# Patient Record
Sex: Female | Born: 1960 | Race: White | Hispanic: No | Smoking: Former smoker
Health system: Southern US, Community
[De-identification: ages and names within clinical notes are randomized; demographics above are authoritative.]

## PROBLEM LIST (undated history)

## (undated) DIAGNOSIS — F32A Depression, unspecified: Secondary | ICD-10-CM

## (undated) DIAGNOSIS — K219 Gastro-esophageal reflux disease without esophagitis: Secondary | ICD-10-CM

## (undated) DIAGNOSIS — I7090 Unspecified atherosclerosis: Secondary | ICD-10-CM

## (undated) DIAGNOSIS — E7800 Pure hypercholesterolemia, unspecified: Secondary | ICD-10-CM

## (undated) DIAGNOSIS — E785 Hyperlipidemia, unspecified: Secondary | ICD-10-CM

## (undated) DIAGNOSIS — G629 Polyneuropathy, unspecified: Secondary | ICD-10-CM

## (undated) DIAGNOSIS — R7303 Prediabetes: Secondary | ICD-10-CM

## (undated) DIAGNOSIS — B009 Herpesviral infection, unspecified: Secondary | ICD-10-CM

## (undated) DIAGNOSIS — Z9889 Other specified postprocedural states: Secondary | ICD-10-CM

## (undated) DIAGNOSIS — D692 Other nonthrombocytopenic purpura: Secondary | ICD-10-CM

## (undated) DIAGNOSIS — R112 Nausea with vomiting, unspecified: Secondary | ICD-10-CM

## (undated) DIAGNOSIS — M797 Fibromyalgia: Secondary | ICD-10-CM

## (undated) DIAGNOSIS — F329 Major depressive disorder, single episode, unspecified: Secondary | ICD-10-CM

## (undated) DIAGNOSIS — F419 Anxiety disorder, unspecified: Secondary | ICD-10-CM

## (undated) DIAGNOSIS — J449 Chronic obstructive pulmonary disease, unspecified: Secondary | ICD-10-CM

## (undated) DIAGNOSIS — H4720 Unspecified optic atrophy: Secondary | ICD-10-CM

## (undated) DIAGNOSIS — I10 Essential (primary) hypertension: Secondary | ICD-10-CM

## (undated) HISTORY — DX: Polyneuropathy, unspecified: G62.9

## (undated) HISTORY — PX: CARPAL TUNNEL RELEASE: SHX101

## (undated) HISTORY — DX: Unspecified atherosclerosis: I70.90

## (undated) HISTORY — DX: Chronic obstructive pulmonary disease, unspecified: J44.9

## (undated) HISTORY — DX: Prediabetes: R73.03

## (undated) HISTORY — PX: THUMB ARTHROSCOPY: SHX2509

## (undated) HISTORY — DX: Pure hypercholesterolemia, unspecified: E78.00

## (undated) HISTORY — PX: TUBAL LIGATION: SHX77

## (undated) HISTORY — DX: Other nonthrombocytopenic purpura: D69.2

## (undated) HISTORY — PX: TONSILLECTOMY: SUR1361

## (undated) HISTORY — PX: RIGHT OOPHORECTOMY: SHX2359

## (undated) HISTORY — DX: Anxiety disorder, unspecified: F41.9

## (undated) HISTORY — DX: Unspecified optic atrophy: H47.20

---

## 1992-11-23 HISTORY — PX: ABDOMINAL HYSTERECTOMY: SHX81

## 1998-05-15 ENCOUNTER — Other Ambulatory Visit: Admission: RE | Admit: 1998-05-15 | Discharge: 1998-05-15 | Payer: Self-pay | Admitting: *Deleted

## 2003-10-28 ENCOUNTER — Emergency Department (HOSPITAL_COMMUNITY): Admission: EM | Admit: 2003-10-28 | Discharge: 2003-10-28 | Payer: Self-pay | Admitting: Emergency Medicine

## 2005-12-28 ENCOUNTER — Ambulatory Visit (HOSPITAL_COMMUNITY): Admission: RE | Admit: 2005-12-28 | Discharge: 2005-12-28 | Payer: Self-pay | Admitting: Internal Medicine

## 2007-06-11 ENCOUNTER — Emergency Department (HOSPITAL_COMMUNITY): Admission: EM | Admit: 2007-06-11 | Discharge: 2007-06-11 | Payer: Self-pay | Admitting: Emergency Medicine

## 2007-09-02 ENCOUNTER — Ambulatory Visit (HOSPITAL_COMMUNITY): Admission: RE | Admit: 2007-09-02 | Discharge: 2007-09-02 | Payer: Self-pay | Admitting: Family Medicine

## 2008-01-28 ENCOUNTER — Emergency Department (HOSPITAL_COMMUNITY): Admission: EM | Admit: 2008-01-28 | Discharge: 2008-01-28 | Payer: Self-pay | Admitting: Emergency Medicine

## 2008-05-15 ENCOUNTER — Ambulatory Visit (HOSPITAL_COMMUNITY): Admission: RE | Admit: 2008-05-15 | Discharge: 2008-05-15 | Payer: Self-pay | Admitting: Obstetrics and Gynecology

## 2008-11-26 ENCOUNTER — Emergency Department (HOSPITAL_COMMUNITY): Admission: EM | Admit: 2008-11-26 | Discharge: 2008-11-26 | Payer: Self-pay | Admitting: Emergency Medicine

## 2009-04-14 ENCOUNTER — Emergency Department (HOSPITAL_COMMUNITY): Admission: EM | Admit: 2009-04-14 | Discharge: 2009-04-14 | Payer: Self-pay | Admitting: Emergency Medicine

## 2009-06-25 ENCOUNTER — Emergency Department (HOSPITAL_COMMUNITY): Admission: EM | Admit: 2009-06-25 | Discharge: 2009-06-25 | Payer: Self-pay | Admitting: Emergency Medicine

## 2009-09-01 ENCOUNTER — Emergency Department (HOSPITAL_COMMUNITY): Admission: EM | Admit: 2009-09-01 | Discharge: 2009-09-01 | Payer: Self-pay | Admitting: Emergency Medicine

## 2010-01-14 ENCOUNTER — Encounter: Admission: RE | Admit: 2010-01-14 | Discharge: 2010-04-14 | Payer: Self-pay | Admitting: Anesthesiology

## 2010-07-29 ENCOUNTER — Emergency Department (HOSPITAL_COMMUNITY): Admission: EM | Admit: 2010-07-29 | Discharge: 2010-07-29 | Payer: Self-pay | Admitting: Emergency Medicine

## 2010-12-14 ENCOUNTER — Encounter: Payer: Self-pay | Admitting: Family Medicine

## 2010-12-15 ENCOUNTER — Encounter: Payer: Self-pay | Admitting: Family Medicine

## 2011-02-26 LAB — BASIC METABOLIC PANEL
BUN: 7 mg/dL (ref 6–23)
CO2: 28 mEq/L (ref 19–32)
Calcium: 9.1 mg/dL (ref 8.4–10.5)
Chloride: 105 mEq/L (ref 96–112)
Creatinine, Ser: 0.65 mg/dL (ref 0.4–1.2)
GFR calc Af Amer: >60 mL/min (ref >60)
GFR calc non Af Amer: >60 mL/min (ref >60)
Glucose, Bld: 94 mg/dL (ref 70–99)
Potassium: 3.5 mEq/L (ref 3.5–5.1)
Sodium: 138 mEq/L (ref 135–145)

## 2011-02-26 LAB — DIFFERENTIAL
Basophils Absolute: 0 10*3/uL (ref 0.0–0.1)
Basophils Relative: 0 % (ref 0–1)
Eosinophils Absolute: 0.3 10*3/uL (ref 0.0–0.7)
Eosinophils Relative: 3 % (ref 0–5)
Lymphocytes Relative: 24 % (ref 12–46)
Lymphs Abs: 2.6 10*3/uL (ref 0.7–4.0)
Monocytes Absolute: 0.7 10*3/uL (ref 0.1–1.0)
Monocytes Relative: 6 % (ref 3–12)
Neutro Abs: 7.4 10*3/uL (ref 1.7–7.7)
Neutrophils Relative %: 67 % (ref 43–77)

## 2011-02-26 LAB — RAPID URINE DRUG SCREEN, HOSP PERFORMED
Amphetamines: NOT DETECTED
Barbiturates: NOT DETECTED
Benzodiazepines: NOT DETECTED
Cocaine: NOT DETECTED
Opiates: NOT DETECTED
Tetrahydrocannabinol: NOT DETECTED

## 2011-02-26 LAB — CBC
HCT: 41 % (ref 36.0–46.0)
Hemoglobin: 14 g/dL (ref 12.0–15.0)
MCHC: 34.3 g/dL (ref 30.0–36.0)
MCV: 85.7 fL (ref 78.0–100.0)
Platelets: 295 10*3/uL (ref 150–400)
RBC: 4.78 MIL/uL (ref 3.87–5.11)
RDW: 13.6 % (ref 11.5–15.5)
WBC: 11.1 10*3/uL — ABNORMAL HIGH (ref 4.0–10.5)

## 2011-02-26 LAB — ETHANOL: Alcohol, Ethyl (B): <5 mg/dL (ref 0–10)

## 2011-03-03 LAB — COMPREHENSIVE METABOLIC PANEL
ALT: 34 U/L (ref 0–35)
AST: 29 U/L (ref 0–37)
Albumin: 3.4 g/dL — ABNORMAL LOW (ref 3.5–5.2)
Alkaline Phosphatase: 78 U/L (ref 39–117)
BUN: 9 mg/dL (ref 6–23)
CO2: 27 mEq/L (ref 19–32)
Calcium: 9.6 mg/dL (ref 8.4–10.5)
Chloride: 104 mEq/L (ref 96–112)
Creatinine, Ser: 0.55 mg/dL (ref 0.4–1.2)
GFR calc Af Amer: >60 mL/min (ref >60)
GFR calc non Af Amer: >60 mL/min (ref >60)
Glucose, Bld: 110 mg/dL — ABNORMAL HIGH (ref 70–99)
Potassium: 3.9 mEq/L (ref 3.5–5.1)
Sodium: 137 mEq/L (ref 135–145)
Total Bilirubin: 0.3 mg/dL (ref 0.3–1.2)
Total Protein: 6.4 g/dL (ref 6.0–8.3)

## 2011-03-03 LAB — URINALYSIS, ROUTINE W REFLEX MICROSCOPIC
Bilirubin Urine: NEGATIVE
Glucose, UA: NEGATIVE mg/dL
Ketones, ur: NEGATIVE mg/dL
Nitrite: NEGATIVE
Protein, ur: NEGATIVE mg/dL
Specific Gravity, Urine: <1.005 — ABNORMAL LOW (ref 1.005–1.030)
Urobilinogen, UA: 0.2 mg/dL (ref 0.0–1.0)
pH: 6.5 (ref 5.0–8.0)

## 2011-03-03 LAB — CBC
HCT: 39.7 % (ref 36.0–46.0)
Hemoglobin: 14 g/dL (ref 12.0–15.0)
MCHC: 35.3 g/dL (ref 30.0–36.0)
MCV: 87.6 fL (ref 78.0–100.0)
Platelets: 290 10*3/uL (ref 150–400)
RBC: 4.53 MIL/uL (ref 3.87–5.11)
RDW: 13.3 % (ref 11.5–15.5)
WBC: 13.4 10*3/uL — ABNORMAL HIGH (ref 4.0–10.5)

## 2011-03-03 LAB — URINE MICROSCOPIC-ADD ON

## 2011-03-03 LAB — DIFFERENTIAL
Basophils Absolute: 0 10*3/uL (ref 0.0–0.1)
Basophils Relative: 0 % (ref 0–1)
Eosinophils Absolute: 0.3 10*3/uL (ref 0.0–0.7)
Eosinophils Relative: 3 % (ref 0–5)
Lymphocytes Relative: 16 % (ref 12–46)
Lymphs Abs: 2.1 10*3/uL (ref 0.7–4.0)
Monocytes Absolute: 0.7 10*3/uL (ref 0.1–1.0)
Monocytes Relative: 5 % (ref 3–12)
Neutro Abs: 10.2 10*3/uL — ABNORMAL HIGH (ref 1.7–7.7)
Neutrophils Relative %: 76 % (ref 43–77)

## 2011-03-03 LAB — URINE CULTURE: Colony Count: >=100000

## 2011-03-09 LAB — DIFFERENTIAL
Basophils Absolute: 0 10*3/uL (ref 0.0–0.1)
Basophils Relative: 0 % (ref 0–1)
Eosinophils Absolute: 0.1 10*3/uL (ref 0.0–0.7)
Eosinophils Relative: 1 % (ref 0–5)
Lymphocytes Relative: 16 % (ref 12–46)
Lymphs Abs: 2 10*3/uL (ref 0.7–4.0)
Monocytes Absolute: 0.7 10*3/uL (ref 0.1–1.0)
Monocytes Relative: 5 % (ref 3–12)
Neutro Abs: 9.8 10*3/uL — ABNORMAL HIGH (ref 1.7–7.7)
Neutrophils Relative %: 78 % — ABNORMAL HIGH (ref 43–77)

## 2011-03-09 LAB — BASIC METABOLIC PANEL
BUN: 12 mg/dL (ref 6–23)
CO2: 26 mEq/L (ref 19–32)
Calcium: 8.8 mg/dL (ref 8.4–10.5)
Chloride: 107 mEq/L (ref 96–112)
Creatinine, Ser: 0.91 mg/dL (ref 0.4–1.2)
GFR calc Af Amer: >60 mL/min (ref >60)
GFR calc non Af Amer: >60 mL/min (ref >60)
Glucose, Bld: 100 mg/dL — ABNORMAL HIGH (ref 70–99)
Potassium: 3.5 mEq/L (ref 3.5–5.1)
Sodium: 140 mEq/L (ref 135–145)

## 2011-03-09 LAB — POCT CARDIAC MARKERS
CKMB, poc: <1 ng/mL — ABNORMAL LOW (ref 1.0–8.0)
Myoglobin, poc: 28.2 ng/mL (ref 12–200)
Troponin i, poc: <0.05 ng/mL (ref 0.00–0.09)

## 2011-03-09 LAB — CBC
HCT: 44 % (ref 36.0–46.0)
Hemoglobin: 14.8 g/dL (ref 12.0–15.0)
MCHC: 33.7 g/dL (ref 30.0–36.0)
MCV: 89.9 fL (ref 78.0–100.0)
Platelets: 251 10*3/uL (ref 150–400)
RBC: 4.9 MIL/uL (ref 3.87–5.11)
RDW: 12.6 % (ref 11.5–15.5)
WBC: 12.5 10*3/uL — ABNORMAL HIGH (ref 4.0–10.5)

## 2011-09-07 LAB — URINALYSIS, ROUTINE W REFLEX MICROSCOPIC
Bilirubin Urine: NEGATIVE
Glucose, UA: NEGATIVE
Ketones, ur: NEGATIVE
Protein, ur: NEGATIVE
pH: 6.5

## 2011-09-07 LAB — PREGNANCY, URINE: Preg Test, Ur: NEGATIVE

## 2011-09-07 LAB — URINE MICROSCOPIC-ADD ON

## 2012-03-26 ENCOUNTER — Emergency Department (HOSPITAL_COMMUNITY)
Admission: EM | Admit: 2012-03-26 | Discharge: 2012-03-26 | Disposition: A | Discharge status: Home / Self Care | Payer: Medicare Other | Attending: Emergency Medicine | Admitting: Emergency Medicine

## 2012-03-26 ENCOUNTER — Encounter (HOSPITAL_COMMUNITY): Payer: Self-pay

## 2012-03-26 DIAGNOSIS — J069 Acute upper respiratory infection, unspecified: Secondary | ICD-10-CM | POA: Insufficient documentation

## 2012-03-26 DIAGNOSIS — IMO0001 Reserved for inherently not codable concepts without codable children: Secondary | ICD-10-CM | POA: Insufficient documentation

## 2012-03-26 DIAGNOSIS — J329 Chronic sinusitis, unspecified: Secondary | ICD-10-CM | POA: Insufficient documentation

## 2012-03-26 HISTORY — DX: Fibromyalgia: M79.7

## 2012-03-26 HISTORY — DX: Depression, unspecified: F32.A

## 2012-03-26 HISTORY — DX: Major depressive disorder, single episode, unspecified: F32.9

## 2012-03-26 MED ORDER — PROMETHAZINE-CODEINE 6.25-10 MG/5ML PO SYRP: 5.0000 mL | ORAL_SOLUTION | Freq: Four times a day (QID) | ORAL | Status: AC | PRN

## 2012-03-26 MED ORDER — PREDNISONE 10 MG PO TABS: 20.0000 mg | ORAL_TABLET | Freq: Every day | ORAL | Status: DC

## 2012-03-26 MED ORDER — PREDNISONE 20 MG PO TABS
60.0000 mg | ORAL_TABLET | Freq: Once | ORAL | Status: AC
  Administered 2012-03-26: 60 mg via ORAL
  Filled 2012-03-26: qty 3

## 2012-03-26 MED ORDER — ONDANSETRON 4 MG PO TBDP
4.0000 mg | ORAL_TABLET | Freq: Once | ORAL | Status: AC
  Administered 2012-03-26: 4 mg via ORAL
  Filled 2012-03-26: qty 1

## 2012-03-26 MED ORDER — HYDROCOD POLST-CHLORPHEN POLST 10-8 MG/5ML PO LQCR
5.0000 mL | Freq: Once | ORAL | Status: AC
  Administered 2012-03-26: 5 mL via ORAL
  Filled 2012-03-26: qty 5

## 2012-03-26 MED ORDER — FEXOFENADINE-PSEUDOEPHED ER 60-120 MG PO TB12: 1.0000 | ORAL_TABLET | Freq: Two times a day (BID) | ORAL | Status: AC

## 2012-03-26 MED ORDER — ALBUTEROL SULFATE HFA 108 (90 BASE) MCG/ACT IN AERS
2.0000 | INHALATION_SPRAY | RESPIRATORY_TRACT | Status: DC
  Administered 2012-03-26: 2 via RESPIRATORY_TRACT
  Filled 2012-03-26: qty 6.7

## 2012-03-26 NOTE — ED Notes (Signed)
Cough and congestion for several days, taking otc meds for same, now also nausea, no vomiting or diarrhea

## 2012-03-26 NOTE — Discharge Instructions (Signed)
Please  Increase fluids. Promethazine cough med every 6 hours. This may cause drowsiness. Allegra D for congestion 2 times daily. This will relieve sinus pressure and post nasal drip. Prednisone daily for 5 days. Use albuterol for wheezing or difficulty breathing. Please see your MD for recheck next week. Take your meds with you so they can be made a part of your chart.Upper Respiratory Infection, Adult An upper respiratory infection (URI) is also sometimes known as the common cold. The upper respiratory tract includes the nose, sinuses, throat, trachea, and bronchi. Bronchi are the airways leading to the lungs. Most people improve within 1 week, but symptoms can last up to 2 weeks. A residual cough may last even longer.  CAUSES Many different viruses can infect the tissues lining the upper respiratory tract. The tissues become irritated and inflamed and often become very moist. Mucus production is also common. A cold is contagious. You can easily spread the virus to others by oral contact. This includes kissing, sharing a glass, coughing, or sneezing. Touching your mouth or nose and then touching a surface, which is then touched by another person, can also spread the virus. SYMPTOMS  Symptoms typically develop 1 to 3 days after you come in contact with a cold virus. Symptoms vary from person to person. They may include:  Runny nose.   Sneezing.   Nasal congestion.   Sinus irritation.   Sore throat.   Loss of voice (laryngitis).   Cough.   Fatigue.   Muscle aches.   Loss of appetite.   Headache.   Low-grade fever.  DIAGNOSIS  You might diagnose your own cold based on familiar symptoms, since most people get a cold 2 to 3 times a year. Your caregiver can confirm this based on your exam. Most importantly, your caregiver can check that your symptoms are not due to another disease such as strep throat, sinusitis, pneumonia, asthma, or epiglottitis. Blood tests, throat tests, and X-rays are  not necessary to diagnose a common cold, but they may sometimes be helpful in excluding other more serious diseases. Your caregiver will decide if any further tests are required. RISKS AND COMPLICATIONS  You may be at risk for a more severe case of the common cold if you smoke cigarettes, have chronic heart disease (such as heart failure) or lung disease (such as asthma), or if you have a weakened immune system. The very young and very old are also at risk for more serious infections. Bacterial sinusitis, middle ear infections, and bacterial pneumonia can complicate the common cold. The common cold can worsen asthma and chronic obstructive pulmonary disease (COPD). Sometimes, these complications can require emergency medical care and may be life-threatening. PREVENTION  The best way to protect against getting a cold is to practice good hygiene. Avoid oral or hand contact with people with cold symptoms. Wash your hands often if contact occurs. There is no clear evidence that vitamin C, vitamin E, echinacea, or exercise reduces the chance of developing a cold. However, it is always recommended to get plenty of rest and practice good nutrition. TREATMENT  Treatment is directed at relieving symptoms. There is no cure. Antibiotics are not effective, because the infection is caused by a virus, not by bacteria. Treatment may include:  Increased fluid intake. Sports drinks offer valuable electrolytes, sugars, and fluids.   Breathing heated mist or steam (vaporizer or shower).   Eating chicken soup or other clear broths, and maintaining good nutrition.   Getting plenty of rest.  Using gargles or lozenges for comfort.   Controlling fevers with ibuprofen or acetaminophen as directed by your caregiver.   Increasing usage of your inhaler if you have asthma.  Zinc gel and zinc lozenges, taken in the first 24 hours of the common cold, can shorten the duration and lessen the severity of symptoms. Pain  medicines may help with fever, muscle aches, and throat pain. A variety of non-prescription medicines are available to treat congestion and runny nose. Your caregiver can make recommendations and may suggest nasal or lung inhalers for other symptoms.  HOME CARE INSTRUCTIONS   Only take over-the-counter or prescription medicines for pain, discomfort, or fever as directed by your caregiver.   Use a warm mist humidifier or inhale steam from a shower to increase air moisture. This may keep secretions moist and make it easier to breathe.   Drink enough water and fluids to keep your urine clear or pale yellow.   Rest as needed.   Return to work when your temperature has returned to normal or as your caregiver advises. You may need to stay home longer to avoid infecting others. You can also use a face mask and careful hand washing to prevent spread of the virus.  SEEK MEDICAL CARE IF:   After the first few days, you feel you are getting worse rather than better.   You need your caregiver's advice about medicines to control symptoms.   You develop chills, worsening shortness of breath, or brown or red sputum. These may be signs of pneumonia.   You develop yellow or brown nasal discharge or pain in the face, especially when you bend forward. These may be signs of sinusitis.   You develop a fever, swollen neck glands, pain with swallowing, or white areas in the back of your throat. These may be signs of strep throat.  SEEK IMMEDIATE MEDICAL CARE IF:   You have a fever.   You develop severe or persistent headache, ear pain, sinus pain, or chest pain.   You develop wheezing, a prolonged cough, cough up blood, or have a change in your usual mucus (if you have chronic lung disease).   You develop sore muscles or a stiff neck.  Document Released: 05/05/2001 Document Revised: 10/29/2011 Document Reviewed: 03/13/2011 [facility] Patient Information 2012 [place], [place].Sinusitis Sinusitis an infection  of the air pockets (sinuses) in your face. This can cause puffiness (swelling). It can also cause drainage from your sinuses.  HOME CARE   Only take medicine as told by your doctor.   Drink enough fluids to keep your pee (urine) clear or pale yellow.   Apply moist heat or ice packs for pain relief.   Use salt (saline) nose sprays. The spray will wet the thick fluid in the nose. This can help the sinuses drain.  GET HELP RIGHT AWAY IF:   You have a fever.   Your baby is older than 3 months with a rectal temperature of 102 F (38.9 C) or higher.   Your baby is 2 months old or younger with a rectal temperature of 100.4 F (38 C) or higher.   The pain gets worse.   You get a very bad headache.   You keep throwing up (vomiting).   Your face gets puffy.  MAKE SURE YOU:   Understand these instructions.   Will watch your condition.   Will get help right away if you are not doing well or get worse.  Document Released: 04/27/2008 Document Revised: 10/29/2011 Document  Reviewed: 04/27/2008 [facility] Patient Information 2012 [place], [place].

## 2012-03-26 NOTE — ED Provider Notes (Signed)
History     CSN: [id number]  Arrival date & time 03/26/12  2146   First MD Initiated Contact with Patient 03/26/12 2212      Chief Complaint  Patient presents with  . Cough  . Nasal Congestion  . Nausea    (Consider location/radiation/quality/duration/timing/severity/associated sxs/prior treatment) Patient is a 51 y.o. female presenting with cough. The history is provided by the patient.  Cough This is a new problem. The problem occurs every few minutes. The problem has been gradually worsening. The cough is productive of sputum. There has been no fever. Associated symptoms include rhinorrhea, myalgias and wheezing. Associated symptoms comments: Shortness of breath.. She has tried nothing for the symptoms. She is a smoker. Her past medical history is significant for bronchitis. Her past medical history does not include emphysema or asthma.    Past Medical History  Diagnosis Date  . Depression   . Fibromyalgia     History reviewed. No pertinent past surgical history.  No family history on file.  History  Substance Use Topics  . Smoking status: Current Everyday Smoker  . Smokeless tobacco: Not on file  . Alcohol Use: No    OB History    Grav Para Term Preterm Abortions TAB SAB Ect Mult Living                  Review of Systems  HENT: Positive for rhinorrhea.   Respiratory: Positive for cough and wheezing.   Musculoskeletal: Positive for myalgias.  Neurological: Positive for light-headedness.  Psychiatric/Behavioral:       Depression    Allergies  Review of patient's allergies indicates no known allergies.  Home Medications   Current Outpatient Rx  Name Route Sig Dispense Refill  . DULOXETINE HCL 60 MG PO CPEP Oral Take 60 mg by mouth 2 (two) times daily.      BP 131/79  Pulse 66  Temp(Src) 98.3 F (36.8 C) (Oral)  Resp 20  SpO2 97%  Physical Exam  Nursing note and vitals reviewed. Constitutional: She is oriented to person, place, and time. She  appears well-developed and well-nourished.  Non-toxic appearance.  HENT:  Head: Normocephalic.  Right Ear: Tympanic membrane and external ear normal.  Left Ear: Tympanic membrane and external ear normal.       Nasal congestion present.  Eyes: EOM and lids are normal. Pupils are equal, round, and reactive to light.  Neck: Normal range of motion. Neck supple. Carotid bruit is not present. No tracheal deviation present.  Cardiovascular: Normal rate, regular rhythm, normal heart sounds, intact distal pulses and normal pulses.   Pulmonary/Chest: No stridor. No respiratory distress.       Course breath sounds noted. Few rhonchi present.  Abdominal: Soft. Bowel sounds are normal. There is no tenderness. There is no guarding.  Musculoskeletal: Normal range of motion.  Lymphadenopathy:       Head (right side): No submandibular adenopathy present.       Head (left side): No submandibular adenopathy present.    She has no cervical adenopathy.  Neurological: She is alert and oriented to person, place, and time. She has normal strength. No cranial nerve deficit or sensory deficit.  Skin: Skin is warm and dry.  Psychiatric: She has a normal mood and affect. Her speech is normal.    ED Course  Procedures (including critical care time)  Labs Reviewed - No data to display No results found.   No diagnosis found.    MDM  I have reviewed  nursing notes, vital signs, and all appropriate lab and imaging results for this patient. Vital signs and pulse Ox stable. Pt has URI with sinusitis. Rx for  Promethazine cough med, prednisone, and allegra D given. Pt to use albuterol for difficulty breathing.       Kathie Dike, [place] 03/26/12 2229

## 2012-03-28 NOTE — ED Provider Notes (Signed)
Medical screening examination/treatment/procedure(s) were performed by non-physician practitioner and as supervising physician I was immediately available for consultation/collaboration.   Laray Anger, DO 03/28/12 [phone]

## 2012-10-14 ENCOUNTER — Other Ambulatory Visit: Payer: Self-pay | Admitting: Orthopedic Surgery

## 2012-10-14 DIAGNOSIS — M25539 Pain in unspecified wrist: Secondary | ICD-10-CM

## 2012-10-21 ENCOUNTER — Ambulatory Visit
Admission: RE | Admit: 2012-10-21 | Discharge: 2012-10-21 | Disposition: A | Discharge status: Home / Self Care | Payer: Medicare Other | Source: Ambulatory Visit | Attending: Orthopedic Surgery | Admitting: Orthopedic Surgery

## 2012-10-21 DIAGNOSIS — M25539 Pain in unspecified wrist: Secondary | ICD-10-CM

## 2012-10-21 MED ORDER — IOHEXOL 180 MG/ML  SOLN
1.0000 mL | Freq: Once | INTRAMUSCULAR | Status: AC | PRN
  Administered 2012-10-21: 1 mL via INTRA_ARTICULAR

## 2013-04-03 ENCOUNTER — Encounter (HOSPITAL_COMMUNITY): Payer: Self-pay

## 2013-04-03 ENCOUNTER — Emergency Department (HOSPITAL_COMMUNITY)
Admission: EM | Admit: 2013-04-03 | Discharge: 2013-04-03 | Disposition: A | Discharge status: Home / Self Care | Payer: Medicare Other | Attending: Emergency Medicine | Admitting: Emergency Medicine

## 2013-04-03 DIAGNOSIS — Z90710 Acquired absence of both cervix and uterus: Secondary | ICD-10-CM | POA: Insufficient documentation

## 2013-04-03 DIAGNOSIS — Z8619 Personal history of other infectious and parasitic diseases: Secondary | ICD-10-CM | POA: Insufficient documentation

## 2013-04-03 DIAGNOSIS — Z8659 Personal history of other mental and behavioral disorders: Secondary | ICD-10-CM | POA: Insufficient documentation

## 2013-04-03 DIAGNOSIS — N898 Other specified noninflammatory disorders of vagina: Secondary | ICD-10-CM | POA: Insufficient documentation

## 2013-04-03 DIAGNOSIS — R3915 Urgency of urination: Secondary | ICD-10-CM | POA: Insufficient documentation

## 2013-04-03 DIAGNOSIS — N390 Urinary tract infection, site not specified: Secondary | ICD-10-CM | POA: Insufficient documentation

## 2013-04-03 DIAGNOSIS — N72 Inflammatory disease of cervix uteri: Secondary | ICD-10-CM | POA: Insufficient documentation

## 2013-04-03 DIAGNOSIS — R350 Frequency of micturition: Secondary | ICD-10-CM | POA: Insufficient documentation

## 2013-04-03 DIAGNOSIS — IMO0001 Reserved for inherently not codable concepts without codable children: Secondary | ICD-10-CM | POA: Insufficient documentation

## 2013-04-03 HISTORY — DX: Herpesviral infection, unspecified: B00.9

## 2013-04-03 LAB — WET PREP, GENITAL
Trich, Wet Prep: NEGATIVE — AB
Yeast Wet Prep HPF POC: NEGATIVE — AB

## 2013-04-03 LAB — URINALYSIS, ROUTINE W REFLEX MICROSCOPIC
Leukocytes, UA: NEGATIVE
Nitrite: NEGATIVE
Specific Gravity, Urine: <1.005 — ABNORMAL LOW (ref 1.005–1.030)
Urobilinogen, UA: 0.2 mg/dL (ref 0.0–1.0)
pH: 6 (ref 5.0–8.0)

## 2013-04-03 MED ORDER — CEFTRIAXONE SODIUM 250 MG IJ SOLR
250.0000 mg | Freq: Once | INTRAMUSCULAR | Status: AC
  Administered 2013-04-03: 250 mg via INTRAMUSCULAR
  Filled 2013-04-03: qty 250

## 2013-04-03 MED ORDER — AZITHROMYCIN 250 MG PO TABS
1000.0000 mg | ORAL_TABLET | Freq: Once | ORAL | Status: AC
  Administered 2013-04-03: 1000 mg via ORAL
  Filled 2013-04-03: qty 4

## 2013-04-03 MED ORDER — METRONIDAZOLE 500 MG PO TABS: 500.0000 mg | ORAL_TABLET | Freq: Two times a day (BID) | ORAL | Status: DC

## 2013-04-03 MED ORDER — LIDOCAINE HCL (PF) 1 % IJ SOLN
INTRAMUSCULAR | Status: AC
  Filled 2013-04-03: qty 5

## 2013-04-03 NOTE — ED Provider Notes (Signed)
History     CSN: [id number]  Arrival date & time 04/03/13  6578   First MD Initiated Contact with Patient 04/03/13 0940      Chief Complaint  Patient presents with  . Urinary Tract Infection    (Consider location/radiation/quality/duration/timing/severity/associated sxs/prior treatment) HPI Comments: Patient c/o dysuria for 2 days but also states that she recently found out that her sexual partner has been cheating on her.  She is concerned about STD.  She denies vaginal bleeding, discharge, abd pain or back pain.  Pt has hx of previous hysterectomy  Patient is a 52 y.o. female presenting with dysuria. The history is provided by the patient.  Dysuria  This is a new problem. The current episode started 2 days ago. The problem occurs every urination. The problem has not changed since onset.The quality of the pain is described as burning and aching. The pain is mild. There has been no fever. She is sexually active. There is no history of pyelonephritis. Associated symptoms include frequency and urgency. Pertinent negatives include no chills, no nausea, no vomiting, no discharge, no hematuria, no hesitancy, no possible pregnancy and no flank pain. She has tried nothing for the symptoms. Her past medical history does not include kidney stones.    Past Medical History  Diagnosis Date  . Depression   . Fibromyalgia   . Herpes     Past Surgical History  Procedure Laterality Date  . Abdominal hysterectomy      No family history on file.  History  Substance Use Topics  . Smoking status: Current Every Day Smoker  . Smokeless tobacco: Not on file  . Alcohol Use: No    OB History   Grav Para Term Preterm Abortions TAB SAB Ect Mult Living                  Review of Systems  Constitutional: Negative for fever and chills.  Respiratory: Negative for shortness of breath.   Gastrointestinal: Negative for nausea, vomiting, abdominal pain and abdominal distention.  Genitourinary:  Positive for dysuria, urgency and frequency. Negative for hesitancy, hematuria, flank pain, vaginal bleeding, vaginal discharge, difficulty urinating, genital sores and pelvic pain.  All other systems reviewed and are negative.    Allergies  Trazodone and nefazodone  Home Medications   Current Outpatient Rx  Name  Route  Sig  Dispense  Refill  . ibuprofen (ADVIL,MOTRIN) 200 MG tablet   Oral   Take 600 mg by mouth every 6 (six) hours as needed for pain.           BP 176/81  Pulse 78  Temp(Src) 98 F (36.7 C) (Oral)  Resp 20  Ht 4\' 11"  (1.499 m)  Wt 164 lb (74.39 kg)  BMI 33.11 kg/m2  SpO2 100%  Physical Exam  Nursing note and vitals reviewed. Constitutional: She is oriented to person, place, and time. She appears well-developed and well-nourished. No distress.  HENT:  Head: Normocephalic and atraumatic.  Cardiovascular: Normal rate, regular rhythm, normal heart sounds and intact distal pulses.   No murmur heard. Pulmonary/Chest: Effort normal and breath sounds normal. No respiratory distress.  Abdominal: Soft. She exhibits no distension and no mass. There is no hepatosplenomegaly. There is no tenderness. There is no rebound, no guarding and no CVA tenderness.  Genitourinary: No erythema, tenderness or bleeding around the vagina. No foreign body around the vagina. Vaginal discharge found.  Mild whitish d/c in vaginal vault.  Uterus absent. ovaries not palpated.   Musculoskeletal:  Normal range of motion.  Neurological: She is alert and oriented to person, place, and time. She exhibits normal muscle tone. Coordination normal.  Skin: Skin is warm and dry.    ED Course  Procedures (including critical care time)  Labs Reviewed  WET PREP, GENITAL - Abnormal; Notable for the following:    Yeast Wet Prep HPF POC NEGATIVE (*)    Trich, Wet Prep NEGATIVE (*)    Clue Cells Wet Prep HPF POC FEW (*)    WBC, Wet Prep HPF POC RARE (*)    All other components within normal limits   URINALYSIS, ROUTINE W REFLEX MICROSCOPIC - Abnormal; Notable for the following:    Specific Gravity, Urine <1.005 (*)    All other components within normal limits  GC/CHLAMYDIA PROBE AMP      GC and chlamydia cultures pending  MDM    Patient is alert, VSS.  Non-toxic appearing.  Since patient has concern for possible STD, I have agreed to treat her with rocephin and zithromax prior to discharge.  Will also cover for BV.  Patient agrees to f/u with health dept or return here if sx's worsen.  She appears stable for discharge        Diamond Daughety L. Rachel Samples, PA-C 04/06/13 1201

## 2013-04-03 NOTE — ED Notes (Signed)
Patient with no complaints at this time. Respirations even and unlabored. Skin warm/dry. Discharge instructions reviewed with patient at this time. Patient given opportunity to voice concerns/ask questions. Patient discharged at this time and left Emergency Department with steady gait.   

## 2013-04-03 NOTE — ED Notes (Signed)
RN called lab to inquire about pending results on UA and wet prep. Lab stated it was "in process"

## 2013-04-03 NOTE — ED Notes (Signed)
Pt reports that pain is all "pelvic", also denies any vaginal discharge. Just got out of a relationship, boyfriend was cheating on her., afraid may be std

## 2013-04-03 NOTE — ED Notes (Signed)
Pt reports uti s/s for 2 days, burning w/ urination, and cramping like periods. No fever or n/v

## 2013-04-06 NOTE — ED Provider Notes (Signed)
Medical screening examination/treatment/procedure(s) were performed by non-physician practitioner and as supervising physician I was immediately available for consultation/collaboration.  Donnetta Hutching, MD 04/06/13 [phone]

## 2013-09-01 ENCOUNTER — Other Ambulatory Visit (INDEPENDENT_AMBULATORY_CARE_PROVIDER_SITE_OTHER): Payer: Self-pay | Admitting: Otolaryngology

## 2013-09-01 DIAGNOSIS — C300 Malignant neoplasm of nasal cavity: Secondary | ICD-10-CM

## 2013-09-14 ENCOUNTER — Encounter (HOSPITAL_COMMUNITY)
Admission: RE | Admit: 2013-09-14 | Discharge: 2013-09-14 | Disposition: A | Discharge status: Home / Self Care | Payer: Medicare Other | Source: Ambulatory Visit | Attending: Otolaryngology | Admitting: Otolaryngology

## 2013-09-14 DIAGNOSIS — C300 Malignant neoplasm of nasal cavity: Secondary | ICD-10-CM | POA: Insufficient documentation

## 2013-09-14 MED ORDER — FLUDEOXYGLUCOSE F - 18 (FDG) INJECTION
17.0000 | Freq: Once | INTRAVENOUS | Status: AC | PRN
  Administered 2013-09-14: 17 via INTRAVENOUS

## 2013-09-20 ENCOUNTER — Encounter (HOSPITAL_BASED_OUTPATIENT_CLINIC_OR_DEPARTMENT_OTHER): Payer: Self-pay | Admitting: *Deleted

## 2013-09-20 NOTE — Progress Notes (Signed)
Pt was here under another name for CTR No labs needed

## 2013-09-22 ENCOUNTER — Encounter (HOSPITAL_BASED_OUTPATIENT_CLINIC_OR_DEPARTMENT_OTHER): Payer: Self-pay | Admitting: *Deleted

## 2013-09-25 ENCOUNTER — Encounter (HOSPITAL_BASED_OUTPATIENT_CLINIC_OR_DEPARTMENT_OTHER)
Admission: RE | Disposition: A | Discharge status: Home / Self Care | Payer: Self-pay | Source: Ambulatory Visit | Attending: Otolaryngology

## 2013-09-25 ENCOUNTER — Ambulatory Visit (HOSPITAL_BASED_OUTPATIENT_CLINIC_OR_DEPARTMENT_OTHER): Payer: Medicare Other | Admitting: Anesthesiology

## 2013-09-25 ENCOUNTER — Encounter (HOSPITAL_BASED_OUTPATIENT_CLINIC_OR_DEPARTMENT_OTHER): Payer: Medicare Other | Admitting: Anesthesiology

## 2013-09-25 ENCOUNTER — Ambulatory Visit (HOSPITAL_BASED_OUTPATIENT_CLINIC_OR_DEPARTMENT_OTHER)
Admission: RE | Admit: 2013-09-25 | Discharge: 2013-09-25 | Disposition: A | Discharge status: Home / Self Care | Payer: Medicare Other | Source: Ambulatory Visit | Attending: Otolaryngology | Admitting: Otolaryngology

## 2013-09-25 ENCOUNTER — Encounter (HOSPITAL_BASED_OUTPATIENT_CLINIC_OR_DEPARTMENT_OTHER): Payer: Self-pay

## 2013-09-25 DIAGNOSIS — C44320 Squamous cell carcinoma of skin of unspecified parts of face: Secondary | ICD-10-CM | POA: Insufficient documentation

## 2013-09-25 DIAGNOSIS — J3489 Other specified disorders of nose and nasal sinuses: Secondary | ICD-10-CM

## 2013-09-25 HISTORY — DX: Other specified postprocedural states: Z98.890

## 2013-09-25 HISTORY — PX: EXCISION NASAL MASS: SHX6271

## 2013-09-25 HISTORY — DX: Other specified postprocedural states: R11.2

## 2013-09-25 LAB — POCT HEMOGLOBIN-HEMACUE: Hemoglobin: 15.7 g/dL — ABNORMAL HIGH (ref 12.0–15.0)

## 2013-09-25 SURGERY — EXCISION, MASS, NOSE
Anesthesia: General | Site: Nose | Laterality: Left | Wound class: Clean Contaminated

## 2013-09-25 MED ORDER — FENTANYL CITRATE 0.05 MG/ML IJ SOLN
INTRAMUSCULAR | Status: DC | PRN
  Administered 2013-09-25 (×3): 25 ug via INTRAVENOUS
  Administered 2013-09-25 (×2): 50 ug via INTRAVENOUS

## 2013-09-25 MED ORDER — AMOXICILLIN 875 MG PO TABS: 875.0000 mg | ORAL_TABLET | Freq: Two times a day (BID) | ORAL | Status: AC

## 2013-09-25 MED ORDER — DEXAMETHASONE SODIUM PHOSPHATE 4 MG/ML IJ SOLN
INTRAMUSCULAR | Status: DC | PRN
  Administered 2013-09-25: 10 mg via INTRAVENOUS

## 2013-09-25 MED ORDER — MIDAZOLAM HCL 2 MG/2ML IJ SOLN: 0.5000 mg | Freq: Once | INTRAMUSCULAR | Status: DC | PRN

## 2013-09-25 MED ORDER — MUPIROCIN 2 % EX OINT
TOPICAL_OINTMENT | CUTANEOUS | Status: AC
  Filled 2013-09-25: qty 22

## 2013-09-25 MED ORDER — PROPOFOL 10 MG/ML IV BOLUS
INTRAVENOUS | Status: DC | PRN
  Administered 2013-09-25: 200 mg via INTRAVENOUS

## 2013-09-25 MED ORDER — OXYMETAZOLINE HCL 0.05 % NA SOLN
NASAL | Status: DC | PRN
  Administered 2013-09-25: 1 via NASAL

## 2013-09-25 MED ORDER — LIDOCAINE HCL (CARDIAC) 20 MG/ML IV SOLN
INTRAVENOUS | Status: DC | PRN
  Administered 2013-09-25: 20 mg via INTRAVENOUS

## 2013-09-25 MED ORDER — OXYCODONE HCL 5 MG/5ML PO SOLN: 5.0000 mg | Freq: Once | ORAL | Status: DC | PRN

## 2013-09-25 MED ORDER — PROMETHAZINE HCL 25 MG/ML IJ SOLN: 6.2500 mg | INTRAMUSCULAR | Status: DC | PRN

## 2013-09-25 MED ORDER — LIDOCAINE-EPINEPHRINE 1 %-1:100000 IJ SOLN
INTRAMUSCULAR | Status: AC
  Filled 2013-09-25: qty 1

## 2013-09-25 MED ORDER — SCOPOLAMINE 1 MG/3DAYS TD PT72
MEDICATED_PATCH | TRANSDERMAL | Status: AC
  Filled 2013-09-25: qty 1

## 2013-09-25 MED ORDER — MIDAZOLAM HCL 2 MG/2ML IJ SOLN: 1.0000 mg | INTRAMUSCULAR | Status: DC | PRN

## 2013-09-25 MED ORDER — MEPERIDINE HCL 25 MG/ML IJ SOLN: 6.2500 mg | INTRAMUSCULAR | Status: DC | PRN

## 2013-09-25 MED ORDER — OXYMETAZOLINE HCL 0.05 % NA SOLN
NASAL | Status: AC
  Filled 2013-09-25: qty 15

## 2013-09-25 MED ORDER — FENTANYL CITRATE 0.05 MG/ML IJ SOLN
INTRAMUSCULAR | Status: AC
  Filled 2013-09-25: qty 4

## 2013-09-25 MED ORDER — MIDAZOLAM HCL 5 MG/5ML IJ SOLN
INTRAMUSCULAR | Status: DC | PRN
  Administered 2013-09-25: 2 mg via INTRAVENOUS

## 2013-09-25 MED ORDER — FENTANYL CITRATE 0.05 MG/ML IJ SOLN
INTRAMUSCULAR | Status: AC
  Filled 2013-09-25: qty 2

## 2013-09-25 MED ORDER — LACTATED RINGERS IV SOLN
INTRAVENOUS | Status: DC
  Administered 2013-09-25 (×3): via INTRAVENOUS

## 2013-09-25 MED ORDER — FENTANYL CITRATE 0.05 MG/ML IJ SOLN
25.0000 ug | INTRAMUSCULAR | Status: DC | PRN
  Administered 2013-09-25 (×3): 25 ug via INTRAVENOUS

## 2013-09-25 MED ORDER — OXYCODONE HCL 5 MG PO TABS: 5.0000 mg | ORAL_TABLET | Freq: Once | ORAL | Status: DC | PRN

## 2013-09-25 MED ORDER — PROPOFOL 10 MG/ML IV BOLUS
INTRAVENOUS | Status: AC
  Filled 2013-09-25: qty 20

## 2013-09-25 MED ORDER — LIDOCAINE-EPINEPHRINE 1 %-1:100000 IJ SOLN
INTRAMUSCULAR | Status: DC | PRN
  Administered 2013-09-25: 4 mL

## 2013-09-25 MED ORDER — MIDAZOLAM HCL 2 MG/2ML IJ SOLN
INTRAMUSCULAR | Status: AC
  Filled 2013-09-25: qty 2

## 2013-09-25 MED ORDER — BACITRACIN ZINC 500 UNIT/GM EX OINT
TOPICAL_OINTMENT | CUTANEOUS | Status: DC | PRN
  Administered 2013-09-25: 1 via TOPICAL

## 2013-09-25 MED ORDER — FENTANYL CITRATE 0.05 MG/ML IJ SOLN: 50.0000 ug | INTRAMUSCULAR | Status: DC | PRN

## 2013-09-25 MED ORDER — SCOPOLAMINE 1 MG/3DAYS TD PT72
1.0000 | MEDICATED_PATCH | TRANSDERMAL | Status: DC
  Administered 2013-09-25: 1.5 mg via TRANSDERMAL

## 2013-09-25 MED ORDER — HYDROCODONE-ACETAMINOPHEN 5-325 MG PO TABS: 1.0000 | ORAL_TABLET | Freq: Four times a day (QID) | ORAL | Status: DC | PRN

## 2013-09-25 MED ORDER — BACITRACIN ZINC 500 UNIT/GM EX OINT
TOPICAL_OINTMENT | CUTANEOUS | Status: AC
  Filled 2013-09-25: qty 28.35

## 2013-09-25 MED ORDER — SUCCINYLCHOLINE CHLORIDE 20 MG/ML IJ SOLN
INTRAMUSCULAR | Status: AC
  Filled 2013-09-25: qty 1

## 2013-09-25 MED ORDER — ONDANSETRON HCL 4 MG/2ML IJ SOLN
INTRAMUSCULAR | Status: DC | PRN
  Administered 2013-09-25: 4 mg via INTRAVENOUS

## 2013-09-25 SURGICAL SUPPLY — 44 items
ADH SKN CLS APL DERMABOND .7 (GAUZE/BANDAGES/DRESSINGS)
APPLICATOR COTTON TIP 6IN STRL (MISCELLANEOUS) ×1 IMPLANT
ATTRACTOMAT 16X20 MAGNETIC DRP (DRAPES) IMPLANT
BLADE SURG 15 STRL LF DISP TIS (BLADE) IMPLANT
BLADE SURG 15 STRL SS (BLADE)
CANISTER SUCT 1200ML W/VALVE (MISCELLANEOUS) ×2 IMPLANT
COAGULATOR SUCT 8FR VV (MISCELLANEOUS) ×1 IMPLANT
DECANTER SPIKE VIAL GLASS SM (MISCELLANEOUS) IMPLANT
DERMABOND ADVANCED (GAUZE/BANDAGES/DRESSINGS)
DERMABOND ADVANCED .7 DNX12 (GAUZE/BANDAGES/DRESSINGS) IMPLANT
DRSG NASAL KENNEDY LMNT 8CM (GAUZE/BANDAGES/DRESSINGS) ×1 IMPLANT
DRSG TELFA 3X8 NADH (GAUZE/BANDAGES/DRESSINGS) IMPLANT
ELECT REM PT RETURN 9FT ADLT (ELECTROSURGICAL) ×2
ELECTRODE REM PT RTRN 9FT ADLT (ELECTROSURGICAL) ×1 IMPLANT
GAUZE XEROFORM 1X8 LF (GAUZE/BANDAGES/DRESSINGS) ×1 IMPLANT
GLOVE BIO SURGEON STRL SZ7.5 (GLOVE) ×3 IMPLANT
GLOVE SURG SS PI 7.0 STRL IVOR (GLOVE) ×3 IMPLANT
GOWN PREVENTION PLUS XLARGE (GOWN DISPOSABLE) ×3 IMPLANT
NDL HYPO 25X1 1.5 SAFETY (NEEDLE) ×1 IMPLANT
NEEDLE HYPO 25X1 1.5 SAFETY (NEEDLE) ×2 IMPLANT
NS IRRIG 1000ML POUR BTL (IV SOLUTION) ×1 IMPLANT
PACK BASIN DAY SURGERY FS (CUSTOM PROCEDURE TRAY) ×2 IMPLANT
PACK ENT DAY SURGERY (CUSTOM PROCEDURE TRAY) ×2 IMPLANT
PAD DRESSING TELFA 3X8 NADH (GAUZE/BANDAGES/DRESSINGS) IMPLANT
SCRUB TECHNI CARE SURGICAL (MISCELLANEOUS) IMPLANT
SHEET MEDIUM DRAPE 40X70 STRL (DRAPES) ×1 IMPLANT
SLEEVE SCD COMPRESS KNEE MED (MISCELLANEOUS) ×1 IMPLANT
SPLINT NASAL DOYLE BI-VL (GAUZE/BANDAGES/DRESSINGS) ×1 IMPLANT
SPONGE GAUZE 2X2 8PLY STRL LF (GAUZE/BANDAGES/DRESSINGS) ×1 IMPLANT
SPONGE LAP 18X18 X RAY DECT (DISPOSABLE) ×1 IMPLANT
SPONGE NEURO XRAY DETECT 1X3 (DISPOSABLE) ×2 IMPLANT
SUT CHROMIC 4 0 P 3 18 (SUTURE) ×1 IMPLANT
SUT PLAIN 4 0 ~~LOC~~ 1 (SUTURE) ×1 IMPLANT
SUT PLAIN 5 0 P 3 18 (SUTURE) ×1 IMPLANT
SUT PROLENE 3 0 PS 2 (SUTURE) ×2 IMPLANT
SUT SILK 4 0 PS 2 (SUTURE) ×1 IMPLANT
SUT VIC AB 4-0 P-3 18XBRD (SUTURE) IMPLANT
SUT VIC AB 4-0 P3 18 (SUTURE)
SUT VICRYL 4-0 PS2 18IN ABS (SUTURE) ×1 IMPLANT
TOWEL OR 17X24 6PK STRL BLUE (TOWEL DISPOSABLE) ×3 IMPLANT
TRAY DSU PREP LF (CUSTOM PROCEDURE TRAY) ×1 IMPLANT
TUBE SALEM SUMP 12R W/ARV (TUBING) IMPLANT
TUBE SALEM SUMP 16 FR W/ARV (TUBING) ×1 IMPLANT
YANKAUER SUCT BULB TIP NO VENT (SUCTIONS) ×2 IMPLANT

## 2013-09-25 NOTE — Anesthesia Postprocedure Evaluation (Signed)
  Anesthesia Post-op Note  Patient: Diamond Adams  Procedure(s) Performed: Procedure(s): EXCISION NASAL CANCER WITH SPLIT THICKNESS SKIN GRAFT FROM RIGHT THIGH (Left)  Patient Location: PACU  Anesthesia Type:General  Level of Consciousness: awake, alert , oriented and patient cooperative  Airway and Oxygen Therapy: Patient Spontanous Breathing  Post-op Pain: none  Post-op Assessment: Post-op Vital signs reviewed, Patient's Cardiovascular Status Stable, Respiratory Function Stable, Patent Airway, No signs of Nausea or vomiting and Pain level controlled  Post-op Vital Signs: Reviewed and stable  Complications: No apparent anesthesia complications

## 2013-09-25 NOTE — Anesthesia Preprocedure Evaluation (Signed)
Anesthesia Evaluation  Patient identified by MRN, date of birth, ID band Patient awake    Reviewed: Allergy & Precautions, H&P , NPO status , Patient's Chart, lab work & pertinent test results  History of Anesthesia Complications (+) PONV and history of anesthetic complications  Airway Mallampati: I TM Distance: >3 FB Neck ROM: Full    Dental  (+) Dental Advisory Given   Pulmonary Current Smoker,  breath sounds clear to auscultation  Pulmonary exam normal       Cardiovascular negative cardio ROS  Rhythm:Regular Rate:Normal     Neuro/Psych    GI/Hepatic Neg liver ROS, GERD-  Controlled,  Endo/Other  Morbid obesity  Renal/GU negative Renal ROS     Musculoskeletal  (+) Fibromyalgia -  Abdominal (+) + obese,   Peds  Hematology   Anesthesia Other Findings   Reproductive/Obstetrics                           Anesthesia Physical Anesthesia Plan  ASA: II  Anesthesia Plan: General   Post-op Pain Management:    Induction: Intravenous  Airway Management Planned: LMA  Additional Equipment:   Intra-op Plan:   Post-operative Plan:   Informed Consent: I have reviewed the patients History and Physical, chart, labs and discussed the procedure including the risks, benefits and alternatives for the proposed anesthesia with the patient or authorized representative who has indicated his/her understanding and acceptance.   Dental advisory given  Plan Discussed with: CRNA and Surgeon  Anesthesia Plan Comments: (Plan routine monitors, GA- LMA OK)        Anesthesia Quick Evaluation

## 2013-09-25 NOTE — Anesthesia Procedure Notes (Signed)
Procedure Name: LMA Insertion Date/Time: 09/25/2013 10:40 AM Performed by: Gar Gibbon Pre-anesthesia Checklist: Patient identified, Emergency Drugs available, Suction available and Patient being monitored Patient Re-evaluated:Patient Re-evaluated prior to inductionOxygen Delivery Method: Circle System Utilized Preoxygenation: Pre-oxygenation with 100% oxygen Intubation Type: IV induction Ventilation: Mask ventilation without difficulty LMA: LMA flexible inserted LMA Size: 4.0 Number of attempts: 1 Airway Equipment and Method: bite block Placement Confirmation: positive ETCO2 Tube secured with: Tape Dental Injury: Teeth and Oropharynx as per pre-operative assessment

## 2013-09-25 NOTE — Brief Op Note (Signed)
09/25/2013  11:45 AM  PATIENT:  Diamond Adams  52 y.o. female  PRE-OPERATIVE DIAGNOSIS:  LEFT NASAL CARCINOMA   POST-OPERATIVE DIAGNOSIS:  LEFT  NASAL CARCINOMA   PROCEDURE:  Procedure(s): EXCISION OF LEFT NASAL CARCINOMA FULL THICKNESS SKIN GRAFT  SURGEON:  Surgeon(s) and Role:    * Sui W Verdis Bassette, MD - Primary  PHYSICIAN ASSISTANT:   ASSISTANTS: none   ANESTHESIA:   general  EBL:  Total I/O In: 1000 [I.V.:1000] Out: -   BLOOD ADMINISTERED:none  DRAINS: none   LOCAL MEDICATIONS USED:  LIDOCAINE   SPECIMEN:  Source of Specimen:  Left nasal specimen  DISPOSITION OF SPECIMEN:  PATHOLOGY  COUNTS:  YES  TOURNIQUET:  * No tourniquets in log *  DICTATION: .Other Dictation: Dictation Number [id number]  PLAN OF CARE: Discharge to home after PACU  PATIENT DISPOSITION:  PACU - hemodynamically stable.   Delay start of Pharmacological VTE agent (>24hrs) due to surgical blood loss or risk of bleeding: not applicable

## 2013-09-25 NOTE — H&P (Signed)
  H&P Update  Pt's original H&P dated 08/31/13 reviewed and placed in chart (to be scanned).  I personally examined the patient today.  No change in health. Proceed with resection of nasal carcinoma and closure with skin graft.

## 2013-09-25 NOTE — Transfer of Care (Signed)
Immediate Anesthesia Transfer of Care Note  Patient: Diamond Adams  Procedure(s) Performed: Procedure(s): EXCISION NASAL CANCER WITH SPLIT THICKNESS SKIN GRAFT FROM RIGHT THIGH (Left)  Patient Location: PACU  Anesthesia Type:General  Level of Consciousness: awake and patient cooperative  Airway & Oxygen Therapy: Patient Spontanous Breathing and aerosol face mask  Post-op Assessment: Report given to PACU RN and Post -op Vital signs reviewed and stable  Post vital signs: Reviewed and stable  Complications: No apparent anesthesia complications

## 2013-09-26 NOTE — Op Note (Signed)
NAMEASTORIA, CONDON NO.:  [id number]  MEDICAL RECORD NO.:  [id number]  LOCATION:                                 FACILITY:  PHYSICIAN:  Newman Pies, MD            DATE OF BIRTH:  September 27, 1961  DATE OF PROCEDURE:  09/25/2013 DATE OF DISCHARGE:                              OPERATIVE REPORT   SURGEON:  Newman Pies, MD  PREOPERATIVE DIAGNOSIS:  Left nasal squamous cell carcinoma.  POSTOPERATIVE DIAGNOSIS:  Left nasal squamous cell carcinoma.  PROCEDURE PERFORMED: 1. Full thickness skin graft for repair of left nasal defect. 2. Excision of left nasal squamous cell carcinoma.  ANESTHESIA:  General laryngeal mask anesthesia.  COMPLICATIONS:  None.  ESTIMATED BLOOD LOSS:  Minimal.  INDICATION FOR PROCEDURE:  The patient is a 52 year old female with a history of left nasal mass at the superior nostril.  She previously underwent excision of the mass approximately 1 month ago.  The pathology of the mass was consistent with squamous cell carcinoma, with positive margins.  On examination, no obvious residual mass or cancer was noted. Her PET scan was also negative for metastasis or nasal residual hyperactivity.  However, in light of the positive margin, the decision was made for the patient to undergo excision of the previous surgical site.  The risks, benefits, and details of the procedure were discussed with the patient.  Questions were invited and answered.  Informed consent was obtained.  DESCRIPTION:  The patient was taken to the operating room and placed supine on the operating table.  General laryngeal mask anesthesia was induced by the anesthesiologist.  The patient was positioned and prepped and draped in a standard fashion for nasal surgery.  Her right side was also prepped and draped sterilely as the donor site of her full- thickness skin graft.  Examination of her left nostril showed no obvious malignancy or skin ulcer.  A 1% lidocaine with 1:100,000  epinephrine was injected into the left superior nostril.  A 1 x 1 cm full thickness skin tissue was then excised from the previous tumor site.  The incision was carried down to the level of the left lower lateral cartilage.  No obvious involvement of the cartilage was noted.  The entire 1 x 1 cm full-thickness mucosal specimen was sent to the Pathology Department for permanent histologic identification.  Hemostasis was achieved with Bovie electrocautery.  Attention was then focused on obtaining a full-thickness skin graft.  A 1% lidocaine with 1:100,000 epinephrine was injected into her right inner thigh.  A 1 x 1 cm full-thickness skin graft was then harvested from the right inner thigh.  The soft tissue surrounding the donor site was then carefully mobilized.  The defect was then closed primarily with 4-0 Vicryl sutures and 3-0 Prolene sutures.  The harvested skin graft was then used to close the soft tissue defect at the superior left nostril.  The skin graft was sutured in place with 5-0 plain gut sutures.  The skin graft was then covered with a layer of Xeroform dressing.  Merocel packing was then placed in the left nasal cavity, to apply pressure  on the skin graft.  The care of the patient was turned over to the anesthesiologist.  The patient was awakened from anesthesia without difficulty.  She was extubated and transferred to the recovery room in good condition.  OPERATIVE FINDINGS:  No obvious residual tumor or mass was noted at the left superior nostril.  A 1 x 1 cm mucosal tissue was then excised from the previous tumor site.  The defect was then covered with 1 x 1 cm full- thickness skin graft.  SPECIMEN:  None.  FOLLOWUP CARE:  The patient will be discharged home once she is awake and alert.  She will be placed on Vicodin 1-2 tablets p.o. q.6 hours p.r.n. pain, and amoxicillin 875 mg p.o. b.i.d. for 5 days.  The patient will follow up in my office in 1 week for  packing removal.     Newman Pies, MD     ST/MEDQ  D:  09/25/2013  T:  09/26/2013  Job:  [id number]

## 2013-09-27 ENCOUNTER — Encounter (HOSPITAL_BASED_OUTPATIENT_CLINIC_OR_DEPARTMENT_OTHER): Payer: Self-pay | Admitting: Otolaryngology

## 2014-02-23 ENCOUNTER — Encounter (HOSPITAL_COMMUNITY): Payer: Self-pay | Admitting: Emergency Medicine

## 2014-02-23 ENCOUNTER — Emergency Department (HOSPITAL_COMMUNITY): Payer: Medicare HMO

## 2014-02-23 ENCOUNTER — Emergency Department (HOSPITAL_COMMUNITY)
Admission: EM | Admit: 2014-02-23 | Discharge: 2014-02-23 | Disposition: A | Discharge status: Home / Self Care | Payer: Medicare HMO | Attending: Emergency Medicine | Admitting: Emergency Medicine

## 2014-02-23 DIAGNOSIS — B349 Viral infection, unspecified: Secondary | ICD-10-CM

## 2014-02-23 DIAGNOSIS — B9789 Other viral agents as the cause of diseases classified elsewhere: Secondary | ICD-10-CM | POA: Insufficient documentation

## 2014-02-23 DIAGNOSIS — Z8659 Personal history of other mental and behavioral disorders: Secondary | ICD-10-CM | POA: Insufficient documentation

## 2014-02-23 DIAGNOSIS — F17200 Nicotine dependence, unspecified, uncomplicated: Secondary | ICD-10-CM | POA: Insufficient documentation

## 2014-02-23 NOTE — ED Provider Notes (Signed)
CSN: [id number]     Arrival date & time 02/23/14  1930 History  This chart was scribed for Leota Jacobsen, MD by Zettie Pho, ED Scribe. This patient was seen in room APA15/APA15 and the patient's care was started at 8:09 PM.    Chief Complaint  Patient presents with  . Generalized Body Aches  . Fever   The history is provided by the patient. No language interpreter was used.   HPI Comments: Diamond Adams is a 53 y.o. female who presents to the Emergency Department complaining of a dry cough with associated subjective, fever (patient is afebrile at 99.8 in the ED), chills, diffuse myalgias, diffuse headache, sinus pressure, and right-sided throat pain. She reports that her symptoms presented about 2 weeks ago and improved for about 2-3 days, then returned a few days ago. Patient reports taking Tylenol at home without significant relief. She denies emesis, diarrhea, abdominal pain, dysuria, urinary frequency. She denies any recent exposure to sick contacts with similar symptoms. Patient has a history of fibromyalgia, depression, and Herpes.   Past Medical History  Diagnosis Date  . Depression   . Fibromyalgia   . Herpes   . PONV (postoperative nausea and vomiting)    Past Surgical History  Procedure Laterality Date  . Abdominal hysterectomy  1994  . Tonsillectomy    . Tubal ligation    . Carpal tunnel release  2013,2011    right and left  . Thumb arthroscopy      right  . Excision nasal mass Left 09/25/2013    Procedure: EXCISION NASAL CANCER WITH SPLIT THICKNESS SKIN GRAFT FROM RIGHT THIGH;  Surgeon: Ascencion Dike, MD;  Location: [place];  Service: ENT;  Laterality: Left;   History reviewed. No pertinent family history. History  Substance Use Topics  . Smoking status: Current Every Day Smoker -- 0.50 packs/day  . Smokeless tobacco: Not on file  . Alcohol Use: No   OB History   Grav Para Term Preterm Abortions TAB SAB Ect Mult Living                 Review of  Systems  Constitutional: Positive for fever (subjective) and chills.  HENT: Positive for sinus pressure and sore throat.   Respiratory: Positive for cough.   Gastrointestinal: Negative for vomiting, abdominal pain and diarrhea.  Genitourinary: Negative for dysuria and frequency.  Musculoskeletal: Positive for myalgias.  Neurological: Positive for headaches.  All other systems reviewed and are negative.      Allergies  Trazodone and nefazodone  Home Medications   Current Outpatient Rx  Name  Route  Sig  Dispense  Refill  . ibuprofen (ADVIL,MOTRIN) 200 MG tablet   Oral   Take 600 mg by mouth every 6 (six) hours as needed for pain.         Marland Kitchen Phenylephrine-DM-GG-APAP (TYLENOL COLD MULTI-SYMPTOM) 5-10-200-325 MG TABS   Oral   Take 1-2 tablets by mouth daily as needed (for cold and flu symptoms).          Triage Vitals: BP 147/74  Pulse 75  Temp(Src) 99.8 F (37.7 C) (Oral)  Resp 20  Ht 4\' 11"  (1.499 m)  Wt 153 lb 14.4 oz (69.809 kg)  BMI 31.07 kg/m2  SpO2 98%  Physical Exam  Nursing note and vitals reviewed. Constitutional: She is oriented to person, place, and time. She appears well-developed and well-nourished.  Non-toxic appearance. No distress.  HENT:  Head: Normocephalic and atraumatic.  Right  Ear: Hearing, tympanic membrane, external ear and ear canal normal.  Left Ear: Hearing, tympanic membrane, external ear and ear canal normal.  Mouth/Throat: Oropharynx is clear and moist. No oropharyngeal exudate.  No maxillary sinus tenderness to palpation.   Eyes: Conjunctivae, EOM and lids are normal. Pupils are equal, round, and reactive to light.  Neck: Normal range of motion. Neck supple. No rigidity. No tracheal deviation and normal range of motion present. No mass present.  Cardiovascular: Normal rate, regular rhythm and normal heart sounds.  Exam reveals no gallop.   No murmur heard. Pulmonary/Chest: Effort normal and breath sounds normal. No stridor. No  respiratory distress. She has no decreased breath sounds. She has no wheezes. She has no rhonchi. She has no rales.  Abdominal: Soft. Normal appearance and bowel sounds are normal. She exhibits no distension. There is no tenderness. There is no rebound and no CVA tenderness.  Musculoskeletal: Normal range of motion. She exhibits no edema and no tenderness.  Lymphadenopathy:    She has no cervical adenopathy.  Neurological: She is alert and oriented to person, place, and time. She has normal strength. No cranial nerve deficit or sensory deficit. GCS eye subscore is 4. GCS verbal subscore is 5. GCS motor subscore is 6.  Skin: Skin is warm and dry. No abrasion and no rash noted.  Psychiatric: She has a normal mood and affect. Her speech is normal and behavior is normal.    ED Course  Procedures (including critical care time)  DIAGNOSTIC STUDIES: Oxygen Saturation is 98% on room air, normal by my interpretation.    COORDINATION OF CARE: 8:15 PM- Discussed that chest x-ray results were normal and symptoms are likely viral in nature. Advised of symptomatic care at home. Discussed treatment plan with patient at bedside and patient verbalized agreement.     Labs Review Labs Reviewed - No data to display  Imaging Review Dg Chest 2 View  02/23/2014   CLINICAL DATA:  Cough and fever  EXAM: CHEST  2 VIEW  COMPARISON:  April 29, 2011  FINDINGS: Lungs are clear. Heart size and pulmonary vascularity are normal. No adenopathy. No pneumothorax. No bone lesions.  IMPRESSION: No abnormality noted.   Electronically Signed   By: Lowella Grip M.D.   On: 02/23/2014 20:05     EKG Interpretation None      MDM   Final diagnoses:  None   Patient likely viral illness. No concern for meningitis. No evidence of peritonsillar abscess. Return precautions given.  I personally performed the services described in this documentation, which was scribed in my presence. The recorded information has been reviewed  and is accurate.      Leota Jacobsen, MD 02/23/14 2026

## 2014-02-23 NOTE — ED Notes (Signed)
Patient reports generalized body aches, cough, fever, chills, and soreness to right neck. Reports symptoms x 2 weeks, then symptoms improved earlier in the week, and worsened yesterday.

## 2014-02-23 NOTE — Discharge Instructions (Signed)

## 2014-03-23 ENCOUNTER — Institutional Professional Consult (permissible substitution): Payer: Medicare Other | Admitting: Internal Medicine

## 2014-10-29 ENCOUNTER — Emergency Department (HOSPITAL_COMMUNITY)
Admission: EM | Admit: 2014-10-29 | Discharge: 2014-10-29 | Disposition: A | Discharge status: Home / Self Care | Payer: Medicare HMO | Attending: Emergency Medicine | Admitting: Emergency Medicine

## 2014-10-29 ENCOUNTER — Encounter (HOSPITAL_COMMUNITY): Payer: Self-pay | Admitting: Cardiology

## 2014-10-29 DIAGNOSIS — Z792 Long term (current) use of antibiotics: Secondary | ICD-10-CM | POA: Insufficient documentation

## 2014-10-29 DIAGNOSIS — L03818 Cellulitis of other sites: Secondary | ICD-10-CM

## 2014-10-29 DIAGNOSIS — R0981 Nasal congestion: Secondary | ICD-10-CM | POA: Diagnosis not present

## 2014-10-29 DIAGNOSIS — Z8659 Personal history of other mental and behavioral disorders: Secondary | ICD-10-CM | POA: Insufficient documentation

## 2014-10-29 DIAGNOSIS — N61 Inflammatory disorders of breast: Secondary | ICD-10-CM | POA: Insufficient documentation

## 2014-10-29 DIAGNOSIS — Z79899 Other long term (current) drug therapy: Secondary | ICD-10-CM | POA: Diagnosis not present

## 2014-10-29 DIAGNOSIS — L089 Local infection of the skin and subcutaneous tissue, unspecified: Secondary | ICD-10-CM | POA: Diagnosis not present

## 2014-10-29 DIAGNOSIS — N644 Mastodynia: Secondary | ICD-10-CM | POA: Diagnosis present

## 2014-10-29 DIAGNOSIS — Z720 Tobacco use: Secondary | ICD-10-CM | POA: Diagnosis not present

## 2014-10-29 DIAGNOSIS — Z8619 Personal history of other infectious and parasitic diseases: Secondary | ICD-10-CM | POA: Diagnosis not present

## 2014-10-29 MED ORDER — SULFAMETHOXAZOLE-TRIMETHOPRIM 800-160 MG PO TABS
1.0000 | ORAL_TABLET | Freq: Once | ORAL | Status: AC
  Administered 2014-10-29: 1 via ORAL
  Filled 2014-10-29: qty 1

## 2014-10-29 MED ORDER — HYDROCODONE-ACETAMINOPHEN 5-325 MG PO TABS
1.0000 | ORAL_TABLET | Freq: Once | ORAL | Status: AC
  Administered 2014-10-29: 1 via ORAL
  Filled 2014-10-29: qty 1

## 2014-10-29 MED ORDER — HYDROCODONE-ACETAMINOPHEN 5-325 MG PO TABS: 1.0000 | ORAL_TABLET | ORAL | Status: DC | PRN

## 2014-10-29 MED ORDER — SULFAMETHOXAZOLE-TRIMETHOPRIM 800-160 MG PO TABS: 1.0000 | ORAL_TABLET | Freq: Two times a day (BID) | ORAL | Status: DC

## 2014-10-29 NOTE — ED Provider Notes (Signed)
CSN: [id number]     Arrival date & time 10/29/14  1725 History  This chart was scribed for non-physician practitioner Evalee Jefferson, PA-C working with Johnna Acosta, MD by Zola Button, ED Scribe. This patient was seen in room APFT20/APFT20 and the patient's care was started at [facility] PM.      Chief Complaint  Patient presents with  . Breast Problem    The history is provided by the patient. No language interpreter was used.   HPI Comments: Diamond Adams is a 53 y.o. female who presents to the Emergency Department complaining of a painful area of redness to her right breast that began 3 days ago that started as a red dot but has been getting worse. Patient also reports mild congestion and right neck tenderness. Patient denies drainage, fever, chills and throat pain. She also denies any change in activity. Patient thinks it could be an insect bite, but she did not feel any bites. She has allergies to tizanidine. Patient has hx of fibromyalgia. She is not on any medications for her fibromyalgia.  PCP: Dr. Woody Seller in [place]  Past Medical History  Diagnosis Date  . Depression   . Fibromyalgia   . Herpes   . PONV (postoperative nausea and vomiting)    Past Surgical History  Procedure Laterality Date  . Abdominal hysterectomy  1994  . Tonsillectomy    . Tubal ligation    . Carpal tunnel release  2013,2011    right and left  . Thumb arthroscopy      right  . Excision nasal mass Left 09/25/2013    Procedure: EXCISION NASAL CANCER WITH SPLIT THICKNESS SKIN GRAFT FROM RIGHT THIGH;  Surgeon: Ascencion Dike, MD;  Location: [place];  Service: ENT;  Laterality: Left;   History reviewed. No pertinent family history. History  Substance Use Topics  . Smoking status: Current Every Day Smoker -- 0.50 packs/day  . Smokeless tobacco: Not on file  . Alcohol Use: No   OB History    No data available     Review of Systems  Constitutional: Negative for fever and chills.  HENT: Positive for  congestion. Negative for sore throat.   Eyes: Negative.   Respiratory: Negative for chest tightness and shortness of breath.   Cardiovascular: Negative for chest pain.  Gastrointestinal: Negative for nausea and abdominal pain.  Genitourinary: Negative.   Musculoskeletal: Positive for myalgias. Negative for joint swelling, arthralgias and neck pain.  Skin: Positive for color change and wound.  Neurological: Negative for dizziness, weakness, light-headedness, numbness and headaches.  Psychiatric/Behavioral: Negative.       Allergies  Trazodone and nefazodone  Home Medications   Prior to Admission medications   Medication Sig Start Date End Date Taking? Authorizing Provider  HYDROcodone-acetaminophen (NORCO/VICODIN) 5-325 MG per tablet Take 1 tablet by mouth every 4 (four) hours as needed. 10/29/14   Evalee Jefferson, PA-C  ibuprofen (ADVIL,MOTRIN) 200 MG tablet Take 600 mg by mouth every 6 (six) hours as needed for pain.    Historical Provider, MD  Phenylephrine-DM-GG-APAP (TYLENOL COLD MULTI-SYMPTOM) 5-10-200-325 MG TABS Take 1-2 tablets by mouth daily as needed (for cold and flu symptoms).    Historical Provider, MD  sulfamethoxazole-trimethoprim (SEPTRA DS) 800-160 MG per tablet Take 1 tablet by mouth every 12 (twelve) hours. 10/29/14   Evalee Jefferson, PA-C   BP 164/81 mmHg  Pulse 63  Temp(Src) 98.7 F (37.1 C) (Oral)  Resp 19  Ht 4\' 11"  (1.499 m)  Wt 145 lb 3.2 oz (65.862 kg)  BMI 29.31 kg/m2  SpO2 100% Physical Exam  Constitutional: She appears well-developed and well-nourished.  HENT:  Head: Normocephalic and atraumatic.  Eyes: Conjunctivae are normal.  Neck: Normal range of motion.  Cardiovascular: Normal rate, regular rhythm, normal heart sounds and intact distal pulses.   Pulmonary/Chest: Effort normal and breath sounds normal. She has no wheezes.  Abdominal: Soft. Bowel sounds are normal. There is no tenderness.  Musculoskeletal: Normal range of motion.  Neurological: She is  alert.  Skin: Skin is warm and dry. There is erythema.  7 cm round patch of erythema on her right medial breast with a 1 cm central dark raised papule like lesion. No induration and no fluctuance. TTP without drainage. No masses or nodules. No axillary adenopathy. No red streaking. Very tiny pustule at her right lateral breast which she states the larger lesion looked like initially.  Psychiatric: She has a normal mood and affect.  Nursing note and vitals reviewed.   ED Course  Procedures  DIAGNOSTIC STUDIES: Oxygen Saturation is 100% on room air, normal by my interpretation.    COORDINATION OF CARE: 6:50 PM-Discussed treatment plan which includes abx and recommendation of warm compresses with pt at bedside and pt agreed to plan. I also outlined the area with a skin marker.   Labs Review Labs Reviewed - No data to display  Imaging Review No results found.   EKG Interpretation None      MDM   Final diagnoses:  Skin infection  Cellulitis of other specified site    Exam c/w cellulitis without abscess.  She was encouraged warm compresses, prescribed bactrim and hydrocodone.  She was advised she needs to get rechecked by her pcp if this site is not regressing over the next 48 hours or sooner for any grossly enlarging site. Skin marker applied. Pt understands tx plan.  I personally performed the services described in this documentation, which was scribed in my presence. The recorded information has been reviewed and is accurate.   Evalee Jefferson, PA-C 10/30/14 1430  Johnna Acosta, MD 10/30/14 [phone]

## 2014-10-29 NOTE — Discharge Instructions (Signed)
Cellulitis Cellulitis is an infection of the skin and the tissue under the skin. The infected area is usually red and tender. This happens most often in the arms and lower legs. HOME CARE   Take your antibiotic medicine as told. Finish the medicine even if you start to feel better.  Keep the infected arm or leg raised (elevated).  Put a warm cloth on the area up to 4 times per day.  Only take medicines as told by your doctor.  Keep all doctor visits as told. GET HELP IF:  You see red streaks on the skin coming from the infected area.  Your red area gets bigger or turns a dark color.  Your bone or joint under the infected area is painful after the skin heals.  Your infection comes back in the same area or different area.  You have a puffy (swollen) bump in the infected area.  You have new symptoms.  You have a fever. GET HELP RIGHT AWAY IF:   You feel very sleepy.  You throw up (vomit) or have watery poop (diarrhea).  You feel sick and have muscle aches and pains. MAKE SURE YOU:   Understand these instructions.  Will watch your condition.  Will get help right away if you are not doing well or get worse. Document Released: 04/27/2008 Document Revised: 03/26/2014 Document Reviewed: 01/25/2012 [facility] Patient Information 2015 [place], [place]. This information is not intended to replace advice given to you by your health care provider. Make sure you discuss any questions you have with your health care provider.   Taking next dose of antibiotics tomorrow morning.  Do not drive within 4 hours of taking hydrocodone as this will make you drowsy.  As discussed, expect to see improvement with the antibiotic by Wednesday, but you may not see improvement over the next 24 hours.  Get rechecked if this area of redness extends more than an inch beyond the skin marker lines over the next 24 hours.

## 2014-10-29 NOTE — ED Notes (Signed)
Noticed red area to right breast Friday.  Area is getting worse.

## 2015-01-06 ENCOUNTER — Encounter (HOSPITAL_COMMUNITY): Payer: Self-pay | Admitting: Emergency Medicine

## 2015-01-06 ENCOUNTER — Emergency Department (HOSPITAL_COMMUNITY)
Admission: EM | Admit: 2015-01-06 | Discharge: 2015-01-06 | Disposition: A | Discharge status: Home / Self Care | Payer: Medicare HMO | Attending: Emergency Medicine | Admitting: Emergency Medicine

## 2015-01-06 DIAGNOSIS — Z8619 Personal history of other infectious and parasitic diseases: Secondary | ICD-10-CM | POA: Diagnosis not present

## 2015-01-06 DIAGNOSIS — Z8659 Personal history of other mental and behavioral disorders: Secondary | ICD-10-CM | POA: Diagnosis not present

## 2015-01-06 DIAGNOSIS — L089 Local infection of the skin and subcutaneous tissue, unspecified: Secondary | ICD-10-CM | POA: Insufficient documentation

## 2015-01-06 DIAGNOSIS — Z720 Tobacco use: Secondary | ICD-10-CM | POA: Insufficient documentation

## 2015-01-06 DIAGNOSIS — L539 Erythematous condition, unspecified: Secondary | ICD-10-CM | POA: Diagnosis present

## 2015-01-06 DIAGNOSIS — M797 Fibromyalgia: Secondary | ICD-10-CM | POA: Insufficient documentation

## 2015-01-06 MED ORDER — DOXYCYCLINE HYCLATE 100 MG PO CAPS: 100.0000 mg | ORAL_CAPSULE | Freq: Two times a day (BID) | ORAL | Status: DC

## 2015-01-06 NOTE — ED Provider Notes (Signed)
CSN: [id number]     Arrival date & time 01/06/15  1906 History  This chart was scribed for Diamond Pollack, MD by Diamond Adams, ED Scribe. The patient was seen in APA10/APA10 and the patient's care was started at 7:23 PM.  Chief Complaint  Patient presents with  . Breast Problem   The history is provided by the patient. No language interpreter was used.    HPI Comments: Diamond Adams is a 54 y.o. female with a history of fibromyalgia who presents to the Emergency Department complaining of red spot on both breasts and on her stomach. The spots have grown in size since she was last seen here. When she was seen here she was treated with antibiotics. Pt notes she has began feeling "feverish" There is no discharge coming from the areas. No cough, SOB or CP. Pt states she is a boderline diabetic   Past Medical History  Diagnosis Date  . Depression   . Fibromyalgia   . Herpes   . PONV (postoperative nausea and vomiting)    Past Surgical History  Procedure Laterality Date  . Abdominal hysterectomy  1994  . Tonsillectomy    . Tubal ligation    . Carpal tunnel release  2013,2011    right and left  . Thumb arthroscopy      right  . Excision nasal mass Left 09/25/2013    Procedure: EXCISION NASAL CANCER WITH SPLIT THICKNESS SKIN GRAFT FROM RIGHT THIGH;  Surgeon: Ascencion Dike, MD;  Location: [facility];  Service: ENT;  Laterality: Left;   No family history on file. History  Substance Use Topics  . Smoking status: Current Every Day Smoker -- 0.50 packs/day  . Smokeless tobacco: Not on file  . Alcohol Use: No   OB History    No data available     Review of Systems  Constitutional: Positive for fever.  Respiratory: Negative for cough and shortness of breath.   Cardiovascular: Negative for chest pain.  Skin: Positive for color change.  All other systems reviewed and are negative.   Allergies  Trazodone and nefazodone  Home Medications   Prior to Admission  medications   Medication Sig Start Date End Date Taking? Authorizing Provider  HYDROcodone-acetaminophen (NORCO/VICODIN) 5-325 MG per tablet Take 1 tablet by mouth every 4 (four) hours as needed. 10/29/14   Evalee Jefferson, PA-C  ibuprofen (ADVIL,MOTRIN) 200 MG tablet Take 600 mg by mouth every 6 (six) hours as needed for pain.    Historical Provider, MD  Phenylephrine-DM-GG-APAP (TYLENOL COLD MULTI-SYMPTOM) 5-10-200-325 MG TABS Take 1-2 tablets by mouth daily as needed (for cold and flu symptoms).    Historical Provider, MD  sulfamethoxazole-trimethoprim (SEPTRA DS) 800-160 MG per tablet Take 1 tablet by mouth every 12 (twelve) hours. 10/29/14   Evalee Jefferson, PA-C   BP 141/83 mmHg  Pulse 78  Temp(Src) 98.1 F (36.7 C) (Oral)  Resp 20  Ht 4\' 11"  (1.499 m)  Wt 144 lb 6.4 oz (65.499 kg)  BMI 29.15 kg/m2  SpO2 100% Physical Exam  Constitutional: She is oriented to person, place, and time. She appears well-developed and well-nourished.  HENT:  Head: Normocephalic and atraumatic.  Right Ear: External ear normal.  Left Ear: External ear normal.  Nose: Nose normal.  Mouth/Throat: Oropharynx is clear and moist.  Eyes: Conjunctivae and EOM are normal. Pupils are equal, round, and reactive to light.  Neck: Normal range of motion. Neck supple. No JVD present. No tracheal deviation  present. No thyromegaly present.  Cardiovascular: Normal rate, regular rhythm, normal heart sounds and intact distal pulses.   Pulmonary/Chest: Effort normal and breath sounds normal. She has no wheezes.  Abdominal: Soft. Bowel sounds are normal. She exhibits no mass. There is no tenderness. There is no guarding.  Musculoskeletal: Normal range of motion.  Lymphadenopathy:    She has no cervical adenopathy.  Neurological: She is alert and oriented to person, place, and time. She has normal reflexes. No cranial nerve deficit or sensory deficit. Gait normal. GCS eye subscore is 4. GCS verbal subscore is 5. GCS motor subscore is 6.   Reflex Scores:      Bicep reflexes are 2+ on the right side and 2+ on the left side.      Patellar reflexes are 2+ on the right side and 2+ on the left side. Strength is normal and equal throughout. Cranial nerves grossly intact. Patient fluent. No gross ataxia and patient able to ambulate without difficulty.  Skin: Skin is warm and dry.  There is an erythematous  2 cm by 2cm  area dense in the middle on both breast and another on the right lower abdomen. Does not appear fluctuant.  Psychiatric: She has a normal mood and affect. Her behavior is normal. Judgment and thought content normal.  Nursing note and vitals reviewed.   ED Course  Procedures  DIAGNOSTIC STUDIES: Oxygen Saturation is 100% on room air, normal by my interpretation.    COORDINATION OF CARE: 7:27 PM Discussed treatment plan with pt at bedside and pt agreed to plan.   Labs Review Labs Reviewed - No data to display  Imaging Review No results found.   EKG Interpretation None      MDM   Final diagnoses:  Skin infection   I personally performed the services described in this documentation, which was scribed in my presence. The recorded information has been reviewed and considered.   Diamond Pollack, MD 01/12/15 [phone]

## 2015-01-06 NOTE — Discharge Instructions (Signed)

## 2015-01-06 NOTE — ED Notes (Signed)
Pt report red spot to right breast and on her stomach "it is getting red around it". States she has had one before and it was treated with antibiotics.

## 2015-07-15 ENCOUNTER — Emergency Department (HOSPITAL_COMMUNITY)
Admission: EM | Admit: 2015-07-15 | Discharge: 2015-07-15 | Disposition: A | Discharge status: Home / Self Care | Payer: Medicare HMO | Attending: Emergency Medicine | Admitting: Emergency Medicine

## 2015-07-15 ENCOUNTER — Encounter (HOSPITAL_COMMUNITY): Payer: Self-pay | Admitting: *Deleted

## 2015-07-15 DIAGNOSIS — Z720 Tobacco use: Secondary | ICD-10-CM | POA: Diagnosis not present

## 2015-07-15 DIAGNOSIS — F329 Major depressive disorder, single episode, unspecified: Secondary | ICD-10-CM | POA: Diagnosis not present

## 2015-07-15 DIAGNOSIS — R300 Dysuria: Secondary | ICD-10-CM | POA: Diagnosis present

## 2015-07-15 DIAGNOSIS — Z8739 Personal history of other diseases of the musculoskeletal system and connective tissue: Secondary | ICD-10-CM | POA: Insufficient documentation

## 2015-07-15 DIAGNOSIS — Z8619 Personal history of other infectious and parasitic diseases: Secondary | ICD-10-CM | POA: Insufficient documentation

## 2015-07-15 DIAGNOSIS — N390 Urinary tract infection, site not specified: Secondary | ICD-10-CM | POA: Diagnosis not present

## 2015-07-15 DIAGNOSIS — Z79899 Other long term (current) drug therapy: Secondary | ICD-10-CM | POA: Insufficient documentation

## 2015-07-15 LAB — URINALYSIS, ROUTINE W REFLEX MICROSCOPIC
Bilirubin Urine: NEGATIVE
Glucose, UA: NEGATIVE mg/dL
Nitrite: NEGATIVE
Specific Gravity, Urine: >1.03 — ABNORMAL HIGH (ref 1.005–1.030)
Urobilinogen, UA: 0.2 mg/dL (ref 0.0–1.0)
pH: 6 (ref 5.0–8.0)

## 2015-07-15 LAB — URINE MICROSCOPIC-ADD ON

## 2015-07-15 MED ORDER — OXYCODONE-ACETAMINOPHEN 5-325 MG PO TABS: 1.0000 | ORAL_TABLET | ORAL | Status: DC | PRN

## 2015-07-15 MED ORDER — SULFAMETHOXAZOLE-TRIMETHOPRIM 800-160 MG PO TABS
1.0000 | ORAL_TABLET | Freq: Once | ORAL | Status: AC
  Administered 2015-07-15: 1 via ORAL
  Filled 2015-07-15: qty 1

## 2015-07-15 MED ORDER — IBUPROFEN 400 MG PO TABS
600.0000 mg | ORAL_TABLET | Freq: Once | ORAL | Status: AC
  Administered 2015-07-15: 600 mg via ORAL
  Filled 2015-07-15: qty 2

## 2015-07-15 MED ORDER — OXYCODONE-ACETAMINOPHEN 5-325 MG PO TABS
2.0000 | ORAL_TABLET | Freq: Once | ORAL | Status: AC
  Administered 2015-07-15: 2 via ORAL
  Filled 2015-07-15: qty 2

## 2015-07-15 MED ORDER — SULFAMETHOXAZOLE-TRIMETHOPRIM 800-160 MG PO TABS: 1.0000 | ORAL_TABLET | Freq: Two times a day (BID) | ORAL | Status: AC

## 2015-07-15 NOTE — ED Provider Notes (Signed)
CSN: [id number]     Arrival date & time 07/15/15  2132 History  This chart was scribed for Virgel Manifold, MD by Irene Pap, ED Scribe. This patient was seen in room APA05/APA05 and patient care was started at 10:46 PM.    Chief Complaint  Patient presents with  . Dysuria   The history is provided by the patient. No language interpreter was used.  HPI Comments: Diamond Adams is a 54 y.o. Female with a hx of herpes who presents to the Emergency Department complaining of dysuria onset 12 hours ago. Pt reports associated lower back pain, diarrhea yesterday, and lower abdominal pain. Reports a hx of UTIs and states that this pain feels similar. Pt denies fever, chills, nausea, and vomiting. She reports that she is borderline diabetic and takes medication daily for her high cholesterol. She denies any known allergies to medications.   Past Medical History  Diagnosis Date  . Depression   . Fibromyalgia   . Herpes   . PONV (postoperative nausea and vomiting)    Past Surgical History  Procedure Laterality Date  . Abdominal hysterectomy  1994  . Tonsillectomy    . Tubal ligation    . Carpal tunnel release  2013,2011    right and left  . Thumb arthroscopy      right  . Excision nasal mass Left 09/25/2013    Procedure: EXCISION NASAL CANCER WITH SPLIT THICKNESS SKIN GRAFT FROM RIGHT THIGH;  Surgeon: Ascencion Dike, MD;  Location: [facility];  Service: ENT;  Laterality: Left;   History reviewed. No pertinent family history. Social History  Substance Use Topics  . Smoking status: Current Every Day Smoker -- 0.50 packs/day  . Smokeless tobacco: None  . Alcohol Use: No   OB History    No data available     Review of Systems A complete 10 system review of systems was obtained and all systems are negative except as noted in the HPI and PMH.   Allergies  Trazodone and nefazodone  Home Medications   Prior to Admission medications   Medication Sig Start Date End Date Taking?  Authorizing Provider  acetaminophen (TYLENOL) 500 MG tablet Take 500 mg by mouth every 6 (six) hours as needed for mild pain or moderate pain.   Yes Historical Provider, MD  ibuprofen (ADVIL,MOTRIN) 200 MG tablet Take 600 mg by mouth every 6 (six) hours as needed for pain.   Yes Historical Provider, MD  pravastatin (PRAVACHOL) 20 MG tablet Take 20 mg by mouth daily.   Yes Historical Provider, MD  traMADol (ULTRAM) 50 MG tablet Take 50 mg by mouth every 6 (six) hours as needed for moderate pain or severe pain.   Yes Historical Provider, MD   BP 173/79 mmHg  Pulse 73  Temp(Src) 98.3 F (36.8 C) (Oral)  Resp 20  Ht 4\' 11"  (1.499 m)  Wt 138 lb 4.8 oz (62.732 kg)  BMI 27.92 kg/m2  SpO2 100%  Physical Exam  Constitutional: She is oriented to person, place, and time. She appears well-developed and well-nourished.  HENT:  Head: Normocephalic and atraumatic.  Eyes: EOM are normal. Pupils are equal, round, and reactive to light.  Neck: Normal range of motion. Neck supple.  Cardiovascular: Normal rate and regular rhythm.   Pulmonary/Chest: Effort normal.  Abdominal: Soft. There is no rebound and no guarding.  Mild suprapubic tenderness  Musculoskeletal: Normal range of motion.  Neurological: She is alert and oriented to person, place, and time.  She has normal reflexes. She displays normal reflexes. No cranial nerve deficit. She exhibits normal muscle tone. Coordination normal.  Skin: Skin is warm and dry.  Psychiatric: She has a normal mood and affect. Her behavior is normal.  Nursing note and vitals reviewed.   ED Course  Procedures (including critical care time) DIAGNOSTIC STUDIES: Oxygen Saturation is 100% on RA, normal by my interpretation.    COORDINATION OF CARE: 10:50 PM-Discussed treatment plan which includes UA and pain medication with pt at bedside and pt agreed to plan.   Labs Review Labs Reviewed  URINALYSIS, ROUTINE W REFLEX MICROSCOPIC (NOT AT [facility]) - Abnormal; Notable  for the following:    APPearance HAZY (*)    Specific Gravity, Urine >1.030 (*)    Hgb urine dipstick TRACE (*)    Ketones, ur TRACE (*)    Protein, ur TRACE (*)    Leukocytes, UA SMALL (*)    All other components within normal limits  URINE MICROSCOPIC-ADD ON - Abnormal; Notable for the following:    Squamous Epithelial / LPF FEW (*)    Bacteria, UA FEW (*)    All other components within normal limits  URINE CULTURE    Imaging Review No results found.    EKG Interpretation None      MDM   Final diagnoses:  UTI (lower urinary tract infection)    54yf with likely simple cystitis. Minimal surprapubic tenderness. It has been determined that no acute conditions requiring further emergency intervention are present at this time. The patient has been advised of the diagnosis and plan. I reviewed any labs and imaging including any potential incidental findings. We have discussed signs and symptoms that warrant return to the ED and they are listed in the discharge instructions.    Virgel Manifold, MD 07/25/15 [phone]

## 2015-07-15 NOTE — Discharge Instructions (Signed)

## 2015-07-15 NOTE — ED Notes (Signed)
Pt c/o lower back pain and pain with urination

## 2015-07-15 NOTE — ED Notes (Signed)
Pt unable to give enough urine for sample at present.

## 2015-07-18 LAB — URINE CULTURE: Culture: 40000

## 2015-07-19 ENCOUNTER — Telehealth (HOSPITAL_BASED_OUTPATIENT_CLINIC_OR_DEPARTMENT_OTHER): Payer: Self-pay | Admitting: Emergency Medicine

## 2015-07-19 NOTE — Telephone Encounter (Signed)
Post ED Visit - Positive Culture Follow-up  Culture report reviewed by antimicrobial stewardship pharmacist: []  Wes Dulaney, Pharm.D., BCPS []  Heide Guile, Pharm.D., BCPS []  Alycia Rossetti, Pharm.D., BCPS []  [place], Pharm.D., BCPS, AAHIVP []  Legrand Como, Pharm.D., BCPS, AAHIVP []  Isac Sarna, Pharm.D., BCPS Dimitri Ped PharmD  Positive urine culture E. coli Treated with  Bactrim ds, organism sensitive to the same and no further patient follow-up is required at this time.  Hazle Nordmann 07/19/2015, 9:17 AM

## 2015-11-11 ENCOUNTER — Emergency Department (HOSPITAL_COMMUNITY)
Admission: EM | Admit: 2015-11-11 | Discharge: 2015-11-11 | Disposition: A | Discharge status: Home / Self Care | Payer: Medicare HMO | Attending: Emergency Medicine | Admitting: Emergency Medicine

## 2015-11-11 ENCOUNTER — Encounter (HOSPITAL_COMMUNITY): Payer: Self-pay | Admitting: *Deleted

## 2015-11-11 DIAGNOSIS — L02211 Cutaneous abscess of abdominal wall: Secondary | ICD-10-CM | POA: Diagnosis present

## 2015-11-11 DIAGNOSIS — L0291 Cutaneous abscess, unspecified: Secondary | ICD-10-CM

## 2015-11-11 DIAGNOSIS — Z79899 Other long term (current) drug therapy: Secondary | ICD-10-CM | POA: Insufficient documentation

## 2015-11-11 DIAGNOSIS — Z8659 Personal history of other mental and behavioral disorders: Secondary | ICD-10-CM | POA: Diagnosis not present

## 2015-11-11 DIAGNOSIS — F17200 Nicotine dependence, unspecified, uncomplicated: Secondary | ICD-10-CM | POA: Diagnosis not present

## 2015-11-11 DIAGNOSIS — Z8619 Personal history of other infectious and parasitic diseases: Secondary | ICD-10-CM | POA: Diagnosis not present

## 2015-11-11 MED ORDER — SULFAMETHOXAZOLE-TRIMETHOPRIM 800-160 MG PO TABS
1.0000 | ORAL_TABLET | Freq: Once | ORAL | Status: AC
  Administered 2015-11-11: 1 via ORAL
  Filled 2015-11-11: qty 1

## 2015-11-11 MED ORDER — HYDROCODONE-ACETAMINOPHEN 5-325 MG PO TABS: ORAL_TABLET | ORAL | Status: DC

## 2015-11-11 MED ORDER — HYDROCODONE-ACETAMINOPHEN 5-325 MG PO TABS
1.0000 | ORAL_TABLET | Freq: Once | ORAL | Status: AC
  Administered 2015-11-11: 1 via ORAL
  Filled 2015-11-11: qty 1

## 2015-11-11 MED ORDER — SULFAMETHOXAZOLE-TRIMETHOPRIM 800-160 MG PO TABS: 1.0000 | ORAL_TABLET | Freq: Two times a day (BID) | ORAL | Status: AC

## 2015-11-11 NOTE — Discharge Instructions (Signed)
Abscess °An abscess (boil or furuncle) is an infected area on or under the skin. This area is filled with yellowish-white fluid (pus) and other material (debris). °HOME CARE  °· Only take medicines as told by your doctor. °· If you were given antibiotic medicine, take it as directed. Finish the medicine even if you start to feel better. °· If gauze is used, follow your doctor's directions for changing the gauze. °· To avoid spreading the infection: °¨ Keep your abscess covered with a bandage. °¨ Wash your hands well. °¨ Do not share personal care items, towels, or whirlpools with others. °¨ Avoid skin contact with others. °· Keep your skin and clothes clean around the abscess. °· Keep all doctor visits as told. °GET HELP RIGHT AWAY IF:  °· You have more pain, puffiness (swelling), or redness in the wound site. °· You have more fluid or blood coming from the wound site. °· You have muscle aches, chills, or you feel sick. °· You have a fever. °MAKE SURE YOU:  °· Understand these instructions. °· Will watch your condition. °· Will get help right away if you are not doing well or get worse. °  °This information is not intended to replace advice given to you by your health care provider. Make sure you discuss any questions you have with your health care provider. °  °Document Released: 04/27/2008 Document Revised: 05/10/2012 Document Reviewed: 01/23/2012 °Elsevier Interactive Patient Education ©2016 [employer] ° °

## 2015-11-11 NOTE — ED Notes (Signed)
Pt c/o ?abscess to lower abd area that started three days ago, pt reports that the area is draining but is not sure of what color the drainage is,

## 2015-11-12 NOTE — ED Provider Notes (Signed)
CSN: [id number]     Arrival date & time 11/11/15  2057 History   First MD Initiated Contact with Patient 11/11/15 2146     Chief Complaint  Patient presents with  . Abscess     (Consider location/radiation/quality/duration/timing/severity/associated sxs/prior Treatment) HPI   Diamond Adams is a 54 y.o. female who presents to the Emergency Department complaining of recurrent boil to her lower abdominal wall.  She states she noticed a "pimple" three days ago that has gotten larger and more painful.  She reports similar symptoms to her right breast that spontaneously drained and resolved.  She has not tried any therapies.  She denies fever, chills, N/V, and myalgias.    Past Medical History  Diagnosis Date  . Depression   . Fibromyalgia   . Herpes   . PONV (postoperative nausea and vomiting)    Past Surgical History  Procedure Laterality Date  . Abdominal hysterectomy  1994  . Tonsillectomy    . Tubal ligation    . Carpal tunnel release  2013,2011    right and left  . Thumb arthroscopy      right  . Excision nasal mass Left 09/25/2013    Procedure: EXCISION NASAL CANCER WITH SPLIT THICKNESS SKIN GRAFT FROM RIGHT THIGH;  Surgeon: Ascencion Dike, MD;  Location: [facility];  Service: ENT;  Laterality: Left;   No family history on file. Social History  Substance Use Topics  . Smoking status: Current Every Day Smoker -- 0.50 packs/day  . Smokeless tobacco: None  . Alcohol Use: No   OB History    No data available     Review of Systems  Constitutional: Negative for fever and chills.  Gastrointestinal: Negative for nausea, vomiting and abdominal pain.  Musculoskeletal: Negative for joint swelling and arthralgias.  Skin: Positive for color change.       Abscess   Hematological: Negative for adenopathy.  All other systems reviewed and are negative.     Allergies  Trazodone and nefazodone  Home Medications   Prior to Admission medications   Medication Sig  Start Date End Date Taking? Authorizing Provider  acetaminophen (TYLENOL) 500 MG tablet Take 500 mg by mouth every 6 (six) hours as needed for mild pain or moderate pain.    Historical Provider, MD  HYDROcodone-acetaminophen (NORCO/VICODIN) 5-325 MG tablet Take one-two tabs po q 4-6 hrs prn pain 11/11/15   Braley Luckenbaugh, PA-C  ibuprofen (ADVIL,MOTRIN) 200 MG tablet Take 600 mg by mouth every 6 (six) hours as needed for pain.    Historical Provider, MD  oxyCODONE-acetaminophen (PERCOCET/ROXICET) 5-325 MG per tablet Take 1-2 tablets by mouth every 4 (four) hours as needed. 07/15/15   Virgel Manifold, MD  pravastatin (PRAVACHOL) 20 MG tablet Take 20 mg by mouth daily.    Historical Provider, MD  sulfamethoxazole-trimethoprim (BACTRIM DS,SEPTRA DS) 800-160 MG tablet Take 1 tablet by mouth 2 (two) times daily. For 10 days 11/11/15 11/18/15  Mahika Vanvoorhis, PA-C  traMADol (ULTRAM) 50 MG tablet Take 50 mg by mouth every 6 (six) hours as needed for moderate pain or severe pain.    Historical Provider, MD   BP 167/71 mmHg  Pulse 72  Temp(Src) 98.3 F (36.8 C) (Oral)  Resp 16  Ht 4\' 11"  (1.499 m)  Wt 63.912 kg  BMI 28.44 kg/m2  SpO2 100% Physical Exam  Constitutional: She is oriented to person, place, and time. She appears well-developed and well-nourished. No distress.  HENT:  Head: Normocephalic and atraumatic.  Cardiovascular: Normal rate and regular rhythm.   No murmur heard. Pulmonary/Chest: Effort normal and breath sounds normal. No respiratory distress.  Abdominal: Soft. She exhibits no distension. There is no tenderness. There is no rebound.  Musculoskeletal: Normal range of motion.  Neurological: She is alert and oriented to person, place, and time. She exhibits normal muscle tone. Coordination normal.  Skin: Skin is warm and dry. There is erythema.  < 2 cm area of induration and erythema to the mid lower abdominal wall.  Mild purulent drainage.  No lymphangitis  Nursing note and vitals  reviewed.   ED Course  Procedures (including critical care time) Labs Review Labs Reviewed - No data to display  Imaging Review No results found. I have personally reviewed and evaluated these images and lab results as part of my medical decision-making.   EKG Interpretation None      MDM   Final diagnoses:  Abscess    <2 cm area of induration to the lower  Mid abdominal wall.  Small amt of purulent drainage present.  Pt offered I&D, but prefers to try abx and warm compresses.  Advised to return in 2-3 days if the sx's are not improving, she agrees.  Appears stable for d/c.  rx for vicodin and bactrim    Kem Parkinson, PA-C [id number]  Delora Fuel, MD [id number]

## 2016-02-17 ENCOUNTER — Encounter (INDEPENDENT_AMBULATORY_CARE_PROVIDER_SITE_OTHER): Payer: Self-pay | Admitting: Ophthalmology

## 2016-08-11 ENCOUNTER — Emergency Department (HOSPITAL_COMMUNITY): Payer: Medicare HMO

## 2016-08-11 ENCOUNTER — Encounter (HOSPITAL_COMMUNITY): Payer: Self-pay | Admitting: Emergency Medicine

## 2016-08-11 ENCOUNTER — Emergency Department (HOSPITAL_COMMUNITY)
Admission: EM | Admit: 2016-08-11 | Discharge: 2016-08-11 | Disposition: A | Discharge status: Home / Self Care | Payer: Medicare HMO | Attending: Emergency Medicine | Admitting: Emergency Medicine

## 2016-08-11 ENCOUNTER — Emergency Department (HOSPITAL_COMMUNITY)
Admission: EM | Admit: 2016-08-11 | Discharge: 2016-08-11 | Discharge status: Against Medical Advice | Payer: Medicare HMO

## 2016-08-11 DIAGNOSIS — F17200 Nicotine dependence, unspecified, uncomplicated: Secondary | ICD-10-CM | POA: Diagnosis not present

## 2016-08-11 DIAGNOSIS — Y999 Unspecified external cause status: Secondary | ICD-10-CM | POA: Insufficient documentation

## 2016-08-11 DIAGNOSIS — Y929 Unspecified place or not applicable: Secondary | ICD-10-CM | POA: Insufficient documentation

## 2016-08-11 DIAGNOSIS — Y939 Activity, unspecified: Secondary | ICD-10-CM | POA: Insufficient documentation

## 2016-08-11 DIAGNOSIS — M20012 Mallet finger of left finger(s): Secondary | ICD-10-CM | POA: Diagnosis not present

## 2016-08-11 DIAGNOSIS — S6992XA Unspecified injury of left wrist, hand and finger(s), initial encounter: Secondary | ICD-10-CM | POA: Diagnosis present

## 2016-08-11 DIAGNOSIS — W1839XA Other fall on same level, initial encounter: Secondary | ICD-10-CM | POA: Insufficient documentation

## 2016-08-11 MED ORDER — IBUPROFEN 800 MG PO TABS
800.0000 mg | ORAL_TABLET | Freq: Once | ORAL | Status: AC
  Administered 2016-08-11: 800 mg via ORAL
  Filled 2016-08-11: qty 1

## 2016-08-11 MED ORDER — NAPROXEN 375 MG PO TABS: 375.0000 mg | ORAL_TABLET | Freq: Two times a day (BID) | ORAL | 0 refills | Status: DC

## 2016-08-11 NOTE — Discharge Instructions (Signed)
Follow up with Dr. Azucena Freed IMMEDIATE MEDICAL CARE IF:  Even after loosening your splint, your finger is: Very red and swollen. White or blue. Numb or tingling.

## 2016-08-11 NOTE — ED Triage Notes (Signed)
Pt fell on porch today hurting 5th digit on left hand

## 2016-08-11 NOTE — ED Provider Notes (Signed)
[facility] DEPT Provider Note   CSN: [id number] Arrival date & time: 08/11/16  1959     History   Chief Complaint Chief Complaint  Patient presents with  . Finger Injury    HPI Diamond Adams is a 55 y.o. female.  SUBJECTIVE: Diamond Adams is a 55 y.o. female who sustained a left hand injury 4 hour(s) ago. Mechanism of injury: patient fell on a porch. Immediate symptoms: immediate pain, immediate swelling. Symptoms have been constant since that time. Prior history of related problems: no prior problems with this area in the past.     HPI  Past Medical History:  Diagnosis Date  . Depression   . Fibromyalgia   . Herpes   . PONV (postoperative nausea and vomiting)     There are no active problems to display for this patient.   Past Surgical History:  Procedure Laterality Date  . ABDOMINAL HYSTERECTOMY  1994  . CARPAL TUNNEL RELEASE  2013,2011   right and left  . EXCISION NASAL MASS Left 09/25/2013   Procedure: EXCISION NASAL CANCER WITH SPLIT THICKNESS SKIN GRAFT FROM RIGHT THIGH;  Surgeon: Ascencion Dike, MD;  Location: [facility];  Service: ENT;  Laterality: Left;  . THUMB ARTHROSCOPY     right  . TONSILLECTOMY    . TUBAL LIGATION      OB History    No data available       Home Medications    Prior to Admission medications   Medication Sig Start Date End Date Taking? Authorizing Provider  acetaminophen (TYLENOL) 500 MG tablet Take 500 mg by mouth every 6 (six) hours as needed for mild pain or moderate pain.    Historical Provider, MD  HYDROcodone-acetaminophen (NORCO/VICODIN) 5-325 MG tablet Take one-two tabs po q 4-6 hrs prn pain 11/11/15   Tammy Triplett, PA-C  ibuprofen (ADVIL,MOTRIN) 200 MG tablet Take 600 mg by mouth every 6 (six) hours as needed for pain.    Historical Provider, MD  naproxen (NAPROSYN) 375 MG tablet Take 1 tablet (375 mg total) by mouth 2 (two) times daily. 08/11/16   Margarita Mail, PA-C  oxyCODONE-acetaminophen  (PERCOCET/ROXICET) 5-325 MG per tablet Take 1-2 tablets by mouth every 4 (four) hours as needed. 07/15/15   Virgel Manifold, MD  pravastatin (PRAVACHOL) 20 MG tablet Take 20 mg by mouth daily.    Historical Provider, MD  traMADol (ULTRAM) 50 MG tablet Take 50 mg by mouth every 6 (six) hours as needed for moderate pain or severe pain.    Historical Provider, MD    Family History No family history on file.  Social History Social History  Substance Use Topics  . Smoking status: Current Every Day Smoker    Packs/day: 0.50  . Smokeless tobacco: Not on file  . Alcohol use No     Allergies   Trazodone and nefazodone   Review of Systems Review of Systems  Constitutional: Negative for fever.  Musculoskeletal: Positive for joint swelling.  Skin: Negative for wound.  Neurological: Negative for numbness.     Physical Exam Updated Vital Signs BP 185/93 (BP Location: Right Arm)   Pulse 64   Temp 98.2 F (36.8 C) (Oral)   Resp 20   Ht 4\' 11"  (1.499 m)   Wt 66.5 kg   SpO2 100%   BMI 29.61 kg/m   Physical Exam Physical Exam  Nursing note and vitals reviewed. Constitutional: She is oriented to person, place, and time. She appears well-developed and well-nourished.  No distress.  HENT:  Head: Normocephalic and atraumatic.  Eyes: Conjunctivae normal and EOM are normal. Pupils are equal, round, and reactive to light. No scleral icterus.  Neck: Normal range of motion.  Cardiovascular: Normal rate, regular rhythm and normal heart sounds.  Exam reveals no gallop and no friction rub.   No murmur heard. Pulmonary/Chest: Effort normal and breath sounds normal. No respiratory distress.  Abdominal: Soft. Bowel sounds are normal. She exhibits no distension and no mass. There is no tenderness. There is no guarding.  Musculoskeletal:Hand exam: Left pinky in flexion. Pain along the extensor sheath. Unable to extend the pinky at the dip.NVI    ED Treatments / Results  Labs (all labs ordered  are listed, but only abnormal results are displayed) Labs Reviewed - No data to display  EKG  EKG Interpretation None       Radiology Dg Hand Complete Left  Result Date: 08/11/2016 CLINICAL DATA:  [place] and injured fifth digit today. EXAM: LEFT HAND - COMPLETE 3+ VIEW COMPARISON:  09/02/2007 FINDINGS: The joint spaces are maintained. No acute fracture is identified. There appears to be a flexion deformity at the DIP joint of the fifth digit. This could be due to an extensor tendon injury. Recommend clinical correlation. No fracture is identified. IMPRESSION: No acute fracture. Flexion deformity at the DIP joint of the fifth finger could be due to an extensor tendon rupture. Recommend clinical correlation. Electronically Signed   By: Marijo Sanes M.D.   On: 08/11/2016 20:57    Procedures Procedures (including critical care time)  Medications Ordered in ED Medications  ibuprofen (ADVIL,MOTRIN) tablet 800 mg (800 mg Oral Given 08/11/16 2221)     Initial Impression / Assessment and Plan / ED Course  I have reviewed the triage vital signs and the nursing notes.  Pertinent labs & imaging results that were available during my care of the patient were reviewed by me and considered in my medical decision making (see chart for details).  Clinical Course    Patient with Left fifth digit mallet finger. She is placed in a splint in extension at the DIP. Patient advised to use supportive care. Cryotherapy. Follow-up with hand. She is safe for discharge at this time  Final Clinical Impressions(s) / ED Diagnoses   Final diagnoses:  Mallet finger of left hand    New Prescriptions New Prescriptions   NAPROXEN (NAPROSYN) 375 MG TABLET    Take 1 tablet (375 mg total) by mouth 2 (two) times daily.     Margarita Mail, PA-C 08/11/16 2258    Elnora Morrison, MD 08/12/16 [phone]

## 2017-03-24 ENCOUNTER — Encounter (INDEPENDENT_AMBULATORY_CARE_PROVIDER_SITE_OTHER): Payer: Medicare Other | Admitting: Ophthalmology

## 2017-03-24 DIAGNOSIS — H43813 Vitreous degeneration, bilateral: Secondary | ICD-10-CM | POA: Diagnosis not present

## 2017-03-24 DIAGNOSIS — H2513 Age-related nuclear cataract, bilateral: Secondary | ICD-10-CM

## 2017-03-24 DIAGNOSIS — H353111 Nonexudative age-related macular degeneration, right eye, early dry stage: Secondary | ICD-10-CM | POA: Diagnosis not present

## 2017-03-24 DIAGNOSIS — H47213 Primary optic atrophy, bilateral: Secondary | ICD-10-CM | POA: Diagnosis not present

## 2017-09-09 ENCOUNTER — Encounter: Payer: Medicare Other | Admitting: Obstetrics & Gynecology

## 2017-09-20 ENCOUNTER — Ambulatory Visit (INDEPENDENT_AMBULATORY_CARE_PROVIDER_SITE_OTHER): Payer: Medicare Other | Admitting: Obstetrics & Gynecology

## 2017-09-20 ENCOUNTER — Encounter (INDEPENDENT_AMBULATORY_CARE_PROVIDER_SITE_OTHER): Payer: Self-pay

## 2017-09-20 ENCOUNTER — Encounter: Payer: Self-pay | Admitting: Obstetrics & Gynecology

## 2017-09-20 VITALS — BP 132/82 | HR 60 | Ht <= 58 in | Wt 145.0 lb

## 2017-09-20 DIAGNOSIS — N952 Postmenopausal atrophic vaginitis: Secondary | ICD-10-CM

## 2017-09-20 DIAGNOSIS — N930 Postcoital and contact bleeding: Secondary | ICD-10-CM | POA: Diagnosis not present

## 2017-09-20 MED ORDER — ESTROGENS, CONJUGATED 0.625 MG/GM VA CREA: 1.0000 | TOPICAL_CREAM | Freq: Every day | VAGINAL | 12 refills | Status: DC

## 2017-09-20 NOTE — Progress Notes (Signed)
Chief Complaint  Patient presents with  . Vaginal Bleeding    one time; s/p hysterectomy      56 y.o. G3P3 No LMP recorded. Patient has had a hysterectomy. The current method of family planning is status post hysterectomy.  Outpatient Encounter Prescriptions as of 09/20/2017  Medication Sig  . acetaminophen (TYLENOL) 500 MG tablet Take 500 mg by mouth every 6 (six) hours as needed for mild pain or moderate pain.  Marland Kitchen FLUoxetine (PROZAC) 10 MG capsule Take 10 mg by mouth daily.  Marland Kitchen ibuprofen (ADVIL,MOTRIN) 200 MG tablet Take 600 mg by mouth every 6 (six) hours as needed for pain.  . naproxen (NAPROSYN) 375 MG tablet Take 1 tablet (375 mg total) by mouth 2 (two) times daily.  . pravastatin (PRAVACHOL) 40 MG tablet Take 40 mg by mouth daily.   Marland Kitchen conjugated estrogens (PREMARIN) vaginal cream Place 1 Applicatorful vaginally daily.  Marland Kitchen HYDROcodone-acetaminophen (NORCO/VICODIN) 5-325 MG tablet Take one-two tabs po q 4-6 hrs prn pain (Patient not taking: Reported on 09/20/2017)  . [DISCONTINUED] oxyCODONE-acetaminophen (PERCOCET/ROXICET) 5-325 MG per tablet Take 1-2 tablets by mouth every 4 (four) hours as needed. (Patient not taking: Reported on 09/20/2017)  . [DISCONTINUED] traMADol (ULTRAM) 50 MG tablet Take 50 mg by mouth every 6 (six) hours as needed for moderate pain or severe pain.   No facility-administered encounter medications on file as of 09/20/2017.     Subjective SUETTA HOFFMEISTER comes in today with complaint of vaginal bleeding about 2 days after having had intercourse and that was about 3 weeks ago She said since the initial bleeding episode she's not had any further recurrences and she did have intercourse once since then with no bleeding She states that time she does not feel it came from her bladder or her rectum based on where was in the pantiliner She's had crampy type pain associated and she was treated for bladder infection by her primary care doctor She had a  hysterectomy in 1994 She has not had any menopausal symptoms she does have 1 ovary She states the bleeding was heavy and lasted just that day again was about a day and a half or 2 days after intercourse and again no other associated signs or symptoms   Past Medical History:  Diagnosis Date  . Anxiety disorder   . Depression   . Fibromyalgia   . Herpes   . PONV (postoperative nausea and vomiting)     Past Surgical History:  Procedure Laterality Date  . ABDOMINAL HYSTERECTOMY  1994  . CARPAL TUNNEL RELEASE  2013,2011   right and left  . EXCISION NASAL MASS Left 09/25/2013   Procedure: EXCISION NASAL CANCER WITH SPLIT THICKNESS SKIN GRAFT FROM RIGHT THIGH;  Surgeon: Ascencion Dike, MD;  Location: [facility];  Service: ENT;  Laterality: Left;  . RIGHT OOPHORECTOMY    . THUMB ARTHROSCOPY     right  . TONSILLECTOMY    . TUBAL LIGATION      OB History    Gravida Para Term Preterm AB Living   3 3       3    SAB TAB Ectopic Multiple Live Births                  Allergies  Allergen Reactions  . Trazodone And Nefazodone Hives    Social History   Social History  . Marital status: Divorced    Spouse name: N/A  . Number of children:  N/A  . Years of education: N/A   Social History Main Topics  . Smoking status: Current Every Day Smoker    Packs/day: 0.50  . Smokeless tobacco: Never Used  . Alcohol use No  . Drug use: No  . Sexual activity: Yes    Birth control/ protection: Surgical   Other Topics Concern  . None   Social History Narrative  . None    Family History  Problem Relation Age of Onset  . Heart disease Father   . Emphysema Mother   . Cancer Sister        cervical    Medications:       Current Outpatient Prescriptions:  .  acetaminophen (TYLENOL) 500 MG tablet, Take 500 mg by mouth every 6 (six) hours as needed for mild pain or moderate pain., Disp: , Rfl:  .  FLUoxetine (PROZAC) 10 MG capsule, Take 10 mg by mouth daily., Disp: , Rfl:  .   ibuprofen (ADVIL,MOTRIN) 200 MG tablet, Take 600 mg by mouth every 6 (six) hours as needed for pain., Disp: , Rfl:  .  naproxen (NAPROSYN) 375 MG tablet, Take 1 tablet (375 mg total) by mouth 2 (two) times daily., Disp: 20 tablet, Rfl: 0 .  pravastatin (PRAVACHOL) 40 MG tablet, Take 40 mg by mouth daily. , Disp: , Rfl:  .  conjugated estrogens (PREMARIN) vaginal cream, Place 1 Applicatorful vaginally daily., Disp: 30 g, Rfl: 12 .  HYDROcodone-acetaminophen (NORCO/VICODIN) 5-325 MG tablet, Take one-two tabs po q 4-6 hrs prn pain (Patient not taking: Reported on 09/20/2017), Disp: 15 tablet, Rfl: 0  Objective Blood pressure 132/82, pulse 60, height 4\' 10"  (1.473 m), weight 145 lb (65.8 kg).  General WDWN female NAD Abdomen soft non tender no rebound no masses Vulva:  normal appearing vulva with no masses, tenderness or lesions Vagina:  normal mucosa, no discharge, no blood and no mucosal defects like a laceration Cervix:  absent Uterus:  uterus absent Adnexa: ovaries:,  normal adnexa in size, nontender and no masses   Pertinent ROS No burning with urination, frequency or urgency No nausea, vomiting or diarrhea Nor fever chills or other constitutional symptoms   Labs or studies Pt reports she had a normal CT scan    Impression Diagnoses this Encounter::   ICD-10-CM   1. PCB (post coital bleeding) N93.0   2. Vaginal atrophy N95.2     Established relevant diagnosis(es):   Plan/Recommendations: Meds ordered this encounter  Medications  . FLUoxetine (PROZAC) 10 MG capsule    Sig: Take 10 mg by mouth daily.  Marland Kitchen conjugated estrogens (PREMARIN) vaginal cream    Sig: Place 1 Applicatorful vaginally daily.    Dispense:  30 g    Refill:  12    Labs or Scans Ordered: No orders of the defined types were placed in this encounter.   Management:: The patient I think had postcoital bleeding from vaginal atrophy and probably had a vaginal laceration On exam there is no polyp or  granulation tissue present and no other lesions that are appreciable Cyclically her cuff is intact Is resolving giving her Premarin vaginal cream to use every other night and she will also use [place] warming when she has intercourse She has other episodes she is instructed to come in to see me and we will reevaluate her again has any other problems just to let me know otherwise I'll see her back as needed or for her yearly GYN exam  Follow up Return in about  1 year (around 09/20/2018), or if symptoms worsen or fail to improve, for Follow up, with Dr Elonda Husky.      All questions were answered.

## 2018-09-06 ENCOUNTER — Other Ambulatory Visit (HOSPITAL_COMMUNITY): Payer: Self-pay | Admitting: Internal Medicine

## 2018-09-06 DIAGNOSIS — Z1231 Encounter for screening mammogram for malignant neoplasm of breast: Secondary | ICD-10-CM

## 2018-09-19 ENCOUNTER — Ambulatory Visit (HOSPITAL_COMMUNITY): Payer: Medicare HMO

## 2018-09-26 ENCOUNTER — Ambulatory Visit (HOSPITAL_COMMUNITY): Payer: Medicare Other

## 2018-10-02 ENCOUNTER — Other Ambulatory Visit: Payer: Self-pay

## 2018-10-02 ENCOUNTER — Emergency Department (HOSPITAL_COMMUNITY)
Admission: EM | Admit: 2018-10-02 | Discharge: 2018-10-02 | Disposition: A | Discharge status: Home / Self Care | Payer: Medicare Other | Attending: Emergency Medicine | Admitting: Emergency Medicine

## 2018-10-02 ENCOUNTER — Encounter (HOSPITAL_COMMUNITY): Payer: Self-pay | Admitting: Emergency Medicine

## 2018-10-02 DIAGNOSIS — N390 Urinary tract infection, site not specified: Secondary | ICD-10-CM | POA: Diagnosis not present

## 2018-10-02 DIAGNOSIS — R300 Dysuria: Secondary | ICD-10-CM | POA: Diagnosis present

## 2018-10-02 DIAGNOSIS — Z79899 Other long term (current) drug therapy: Secondary | ICD-10-CM | POA: Insufficient documentation

## 2018-10-02 DIAGNOSIS — F17200 Nicotine dependence, unspecified, uncomplicated: Secondary | ICD-10-CM | POA: Diagnosis not present

## 2018-10-02 LAB — URINALYSIS, ROUTINE W REFLEX MICROSCOPIC
Bilirubin Urine: NEGATIVE
Glucose, UA: NEGATIVE mg/dL
Hgb urine dipstick: NEGATIVE
Ketones, ur: NEGATIVE mg/dL
Nitrite: NEGATIVE
PH: 6 (ref 5.0–8.0)
Protein, ur: NEGATIVE mg/dL
SPECIFIC GRAVITY, URINE: 1.016 (ref 1.005–1.030)

## 2018-10-02 MED ORDER — CEPHALEXIN 500 MG PO CAPS
500.0000 mg | ORAL_CAPSULE | Freq: Once | ORAL | Status: AC
  Administered 2018-10-02: 500 mg via ORAL
  Filled 2018-10-02: qty 1

## 2018-10-02 MED ORDER — CEPHALEXIN 500 MG PO CAPS: 500.0000 mg | ORAL_CAPSULE | Freq: Four times a day (QID) | ORAL | 0 refills | Status: DC

## 2018-10-02 MED ORDER — PHENAZOPYRIDINE HCL 100 MG PO TABS: 100.0000 mg | ORAL_TABLET | Freq: Three times a day (TID) | ORAL | 0 refills | Status: DC | PRN

## 2018-10-02 MED ORDER — PHENAZOPYRIDINE HCL 100 MG PO TABS
100.0000 mg | ORAL_TABLET | Freq: Once | ORAL | Status: AC
  Administered 2018-10-02: 100 mg via ORAL
  Filled 2018-10-02: qty 1

## 2018-10-02 NOTE — ED Triage Notes (Signed)
Patient c/o dysuria, frequency with little output, and urine being dark with odor. Per patient symptoms started yesterday. Unsure of any fevers. Denies any nausea, vomiting, or diarrhea.

## 2018-10-02 NOTE — Discharge Instructions (Addendum)
You have a urinary tract infection.  Increase fluids.  Prescription for antibiotic and medication to reduce urinary pain.

## 2018-10-02 NOTE — ED Provider Notes (Signed)
[facility] EMERGENCY DEPARTMENT Provider Note   CSN: [id number] Arrival date & time: 10/02/18  1317     History   Chief Complaint Chief Complaint  Patient presents with  . Dysuria    HPI Diamond Adams is a 57 y.o. female.  Dysuria, frequency for 24 hours.  No flank pain, fever, chills.  She has had a couple of urinary tract infections in her life.  No diabetes.  Severity of symptoms is mild.  Nothing makes symptoms better or worse.  She has taken nothing at home yet.     Past Medical History:  Diagnosis Date  . Anxiety disorder   . Depression   . Fibromyalgia   . Herpes   . PONV (postoperative nausea and vomiting)     There are no active problems to display for this patient.   Past Surgical History:  Procedure Laterality Date  . ABDOMINAL HYSTERECTOMY  1994  . CARPAL TUNNEL RELEASE  2013,2011   right and left  . EXCISION NASAL MASS Left 09/25/2013   Procedure: EXCISION NASAL CANCER WITH SPLIT THICKNESS SKIN GRAFT FROM RIGHT THIGH;  Surgeon: Ascencion Dike, MD;  Location: [facility];  Service: ENT;  Laterality: Left;  . RIGHT OOPHORECTOMY    . THUMB ARTHROSCOPY     right  . TONSILLECTOMY    . TUBAL LIGATION       OB History    Gravida  3   Para  3   Term      Preterm      AB      Living  3     SAB      TAB      Ectopic      Multiple      Live Births               Home Medications    Prior to Admission medications   Medication Sig Start Date End Date Taking? Authorizing Provider  acetaminophen (TYLENOL) 500 MG tablet Take 500 mg by mouth every 6 (six) hours as needed for mild pain or moderate pain.    [provider]  cephALEXin (KEFLEX) 500 MG capsule Take 1 capsule (500 mg total) by mouth 4 (four) times daily. 10/02/18   Nat Christen, MD  conjugated estrogens (PREMARIN) vaginal cream Place 1 Applicatorful vaginally daily. 09/20/17   Florian Buff, MD  FLUoxetine (PROZAC) 10 MG capsule Take 10 mg by mouth  daily.    [provider]  HYDROcodone-acetaminophen (NORCO/VICODIN) 5-325 MG tablet Take one-two tabs po q 4-6 hrs prn pain Patient not taking: Reported on 09/20/2017 11/11/15   Triplett, Tammy, PA-C  ibuprofen (ADVIL,MOTRIN) 200 MG tablet Take 600 mg by mouth every 6 (six) hours as needed for pain.    [provider]  naproxen (NAPROSYN) 375 MG tablet Take 1 tablet (375 mg total) by mouth 2 (two) times daily. 08/11/16   Margarita Mail, PA-C  phenazopyridine (PYRIDIUM) 100 MG tablet Take 1 tablet (100 mg total) by mouth 3 (three) times daily as needed for pain. 10/02/18   Nat Christen, MD  pravastatin (PRAVACHOL) 40 MG tablet Take 40 mg by mouth daily.     [provider]    Family History Family History  Problem Relation Age of Onset  . Heart disease Father   . Emphysema Mother   . Cancer Sister        cervical    Social History Social History   Tobacco Use  .  Smoking status: Current Every Day Smoker    Packs/day: 0.50  . Smokeless tobacco: Never Used  Substance Use Topics  . Alcohol use: No  . Drug use: No     Allergies   Trazodone and nefazodone   Review of Systems Review of Systems  All other systems reviewed and are negative.    Physical Exam Updated Vital Signs BP (!) 147/65 (BP Location: Right Arm)   Pulse 65   Temp 97.9 F (36.6 C) (Oral)   Resp 18   Ht 4\' 11"  (1.499 m)   Wt 65 kg   SpO2 98%   BMI 28.94 kg/m   Physical Exam  Constitutional: She is oriented to person, place, and time. She appears well-developed and well-nourished.  HENT:  Head: Normocephalic and atraumatic.  Eyes: Conjunctivae are normal.  Neck: Neck supple.  Cardiovascular: Normal rate and regular rhythm.  Pulmonary/Chest: Effort normal and breath sounds normal.  Abdominal:  Minimal suprapubic tenderness.  Genitourinary:  Genitourinary Comments: No flank tenderness  Musculoskeletal: Normal range of motion.  Neurological: She is alert and oriented to  person, place, and time.  Skin: Skin is warm and dry.  Psychiatric: She has a normal mood and affect. Her behavior is normal.  Nursing note and vitals reviewed.    ED Treatments / Results  Labs (all labs ordered are listed, but only abnormal results are displayed) Labs Reviewed  URINALYSIS, ROUTINE W REFLEX MICROSCOPIC - Abnormal; Notable for the following components:      Result Value   APPearance HAZY (*)    Leukocytes, UA SMALL (*)    Bacteria, UA RARE (*)    All other components within normal limits    EKG None  Radiology No results found.  Procedures Procedures (including critical care time)  Medications Ordered in ED Medications  cephALEXin (KEFLEX) capsule 500 mg (has no administration in time range)  phenazopyridine (PYRIDIUM) tablet 100 mg (has no administration in time range)     Initial Impression / Assessment and Plan / ED Course  I have reviewed the triage vital signs and the nursing notes.  Pertinent labs & imaging results that were available during my care of the patient were reviewed by me and considered in my medical decision making (see chart for details).     History and physical consistent with uncomplicated cystitis.  No evidence of pyelonephritis.  Will Rx cephalexin 500 mg and Macrobid 100 mg.  Increase fluids.  Final Clinical Impressions(s) / ED Diagnoses   Final diagnoses:  Urinary tract infection without hematuria, site unspecified    ED Discharge Orders         Ordered    cephALEXin (KEFLEX) 500 MG capsule  4 times daily     10/02/18 1549    phenazopyridine (PYRIDIUM) 100 MG tablet  3 times daily PRN     10/02/18 1549           Nat Christen, MD 10/02/18 1556

## 2018-10-04 LAB — URINE CULTURE: Culture: 40000 — AB

## 2018-10-05 ENCOUNTER — Telehealth: Payer: Self-pay | Admitting: Emergency Medicine

## 2018-10-05 NOTE — Progress Notes (Signed)
ED Antimicrobial Stewardship Positive Culture Follow Up   Diamond Adams is an 57 y.o. female who presented to [facility] on 10/02/2018 with a chief complaint of  Chief Complaint  Patient presents with  . Dysuria    Recent Results (from the past 720 hour(s))  Urine culture     Status: Abnormal   Collection Time: 10/02/18  3:10 PM  Result Value Ref Range Status   Specimen Description   Final    URINE, CLEAN CATCH Performed at [facility], [address] [place], [place] [address]    Special Requests   Final    NONE Performed at [facility], [address] [place], [place] [address]    Culture 40,000 COLONIES/mL ESCHERICHIA COLI (A)  Final   Report Status 10/04/2018 FINAL  Final   Organism ID, Bacteria ESCHERICHIA COLI (A)  Final      Susceptibility   Escherichia coli - MIC*    AMPICILLIN >=32 RESISTANT Resistant     CEFAZOLIN >=64 RESISTANT Resistant     CEFTRIAXONE <=1 SENSITIVE Sensitive     CIPROFLOXACIN <=0.25 SENSITIVE Sensitive     GENTAMICIN <=1 SENSITIVE Sensitive     IMIPENEM <=0.25 SENSITIVE Sensitive     NITROFURANTOIN <=16 SENSITIVE Sensitive     TRIMETH/SULFA <=20 SENSITIVE Sensitive     AMPICILLIN/SULBACTAM >=32 RESISTANT Resistant     Extended ESBL NEGATIVE Sensitive     * 40,000 COLONIES/mL ESCHERICHIA COLI    [x]  Treated with Cephalexin, organism resistant to prescribed antimicrobial  New antibiotic prescription: Bactrim DS 1 PO BID for 3 days  ED Provider: Lenn Sink, PA-C   Thank you for allowing pharmacy to be a part of this patient's care.  Tamela Gammon, PharmD 10/05/2018 8:36 AM PGY-1 Pharmacy Resident Monday - Friday phone -  [phone] Saturday - Sunday phone - [phone]

## 2018-10-05 NOTE — Telephone Encounter (Signed)
Post ED Visit - Positive Culture Follow-up: Successful Patient Follow-Up  Culture assessed and recommendations reviewed by:  []  Elenor Quinones, Pharm.D. []  Heide Guile, Pharm.D., BCPS AQ-ID []  Parks Neptune, Pharm.D., BCPS []  Alycia Rossetti, Pharm.D., BCPS []  [place], Pharm.D., BCPS, AAHIVP []  Legrand Como, Pharm.D., BCPS, AAHIVP []  Salome Arnt, PharmD, BCPS []  Johnnette Gourd, PharmD, BCPS []  Hughes Better, PharmD, BCPS []  Leeroy Cha, PharmD [facility] PharmD  Positive urine culture  []  Patient discharged without antimicrobial prescription and treatment is now indicated [x]  Organism is resistant to prescribed ED discharge antimicrobial []  Patient with positive blood cultures  Changes discussed with ED provider: Lenn Sink PA New antibiotic prescription discontinue Keflex, start Bactrim DS 1 bid po x 3 days Called to [facility] [phone]  Contacted patient, 10/05/2018 1027   Diamond Adams 10/05/2018, 10:27 AM

## 2018-12-11 ENCOUNTER — Encounter (HOSPITAL_COMMUNITY): Payer: Self-pay

## 2018-12-11 ENCOUNTER — Other Ambulatory Visit: Payer: Self-pay

## 2018-12-11 ENCOUNTER — Emergency Department (HOSPITAL_COMMUNITY)
Admission: EM | Admit: 2018-12-11 | Discharge: 2018-12-11 | Disposition: A | Discharge status: Home / Self Care | Payer: Medicare Other | Attending: Emergency Medicine | Admitting: Emergency Medicine

## 2018-12-11 DIAGNOSIS — Z79899 Other long term (current) drug therapy: Secondary | ICD-10-CM | POA: Insufficient documentation

## 2018-12-11 DIAGNOSIS — M79602 Pain in left arm: Secondary | ICD-10-CM | POA: Insufficient documentation

## 2018-12-11 DIAGNOSIS — F17210 Nicotine dependence, cigarettes, uncomplicated: Secondary | ICD-10-CM | POA: Insufficient documentation

## 2018-12-11 DIAGNOSIS — M797 Fibromyalgia: Secondary | ICD-10-CM | POA: Diagnosis not present

## 2018-12-11 MED ORDER — METHOCARBAMOL 500 MG PO TABS: 500.0000 mg | ORAL_TABLET | Freq: Two times a day (BID) | ORAL | 0 refills | Status: DC | PRN

## 2018-12-11 MED ORDER — IBUPROFEN 600 MG PO TABS: 600.0000 mg | ORAL_TABLET | Freq: Four times a day (QID) | ORAL | 0 refills | Status: DC | PRN

## 2018-12-11 NOTE — ED Triage Notes (Signed)
Pt states she woke up around 7am with left arm pain with radiating tightness up to neck.c/o SOB & palpitations but no chest pain. No n/v/d. Denies any cardiac hx.

## 2018-12-11 NOTE — ED Provider Notes (Signed)
[facility] EMERGENCY DEPARTMENT Provider Note   CSN: [id number] Arrival date & time: 12/11/18  1122     History   Chief Complaint Chief Complaint  Patient presents with  . Arm Pain    HPI Diamond Adams is a 58 y.o. female.  HPI  The patient is a 58 year old female, she has a known history of fibromyalgia as well as depression and anxiety, she does not have any history of cardiac disease.  She reports that several times over the last couple of months she has been awakened from sleep with pain in her left arm which radiates into the shoulder and the neck and sometimes onto the left upper chest.  This always occurs at the same time when she is waking up in the morning and is never associated with any exertion.  Usually she sits on the edge of the bed and this gets better over the next few minutes however today she felt like it lasted longer and it prompted her to take some medicine.  She took some aspirin and over the course of the morning it is gradually resolved.  She feels like she has some residual numbness in her hand but denies any other neurologic symptoms including changes in vision, speech, balance or strength in any of the 4 extremities.  She denies any difficulty with coordination.  She denies any chest pain or shortness of breath and states that the pain is completely resolved at this time.  Past Medical History:  Diagnosis Date  . Anxiety disorder   . Depression   . Fibromyalgia   . Herpes   . PONV (postoperative nausea and vomiting)     There are no active problems to display for this patient.   Past Surgical History:  Procedure Laterality Date  . ABDOMINAL HYSTERECTOMY  1994  . CARPAL TUNNEL RELEASE  2013,2011   right and left  . EXCISION NASAL MASS Left 09/25/2013   Procedure: EXCISION NASAL CANCER WITH SPLIT THICKNESS SKIN GRAFT FROM RIGHT THIGH;  Surgeon: Ascencion Dike, MD;  Location: [place];  Service: ENT;  Laterality: Left;  . RIGHT  OOPHORECTOMY    . THUMB ARTHROSCOPY     right  . TONSILLECTOMY    . TUBAL LIGATION       OB History    Gravida  3   Para  3   Term      Preterm      AB      Living  3     SAB      TAB      Ectopic      Multiple      Live Births               Home Medications    Prior to Admission medications   Medication Sig Start Date End Date Taking? Authorizing Provider  acetaminophen (TYLENOL) 500 MG tablet Take 500 mg by mouth every 6 (six) hours as needed for mild pain or moderate pain.    [provider]  conjugated estrogens (PREMARIN) vaginal cream Place 1 Applicatorful vaginally daily. 09/20/17   Florian Buff, MD  FLUoxetine (PROZAC) 10 MG capsule Take 10 mg by mouth daily.    [provider]  ibuprofen (ADVIL,MOTRIN) 600 MG tablet Take 1 tablet (600 mg total) by mouth every 6 (six) hours as needed. 12/11/18   Noemi Chapel, MD  methocarbamol (ROBAXIN) 500 MG tablet Take 1 tablet (500 mg total) by mouth 2 (  two) times daily as needed for muscle spasms. 12/11/18   Noemi Chapel, MD  pravastatin (PRAVACHOL) 40 MG tablet Take 40 mg by mouth daily.     [provider]    Family History Family History  Problem Relation Age of Onset  . Heart disease Father   . Emphysema Mother   . Cancer Sister        cervical    Social History Social History   Tobacco Use  . Smoking status: Current Every Day Smoker    Packs/day: 0.50  . Smokeless tobacco: Never Used  Substance Use Topics  . Alcohol use: No  . Drug use: No     Allergies   Trazodone and nefazodone   Review of Systems Review of Systems  All other systems reviewed and are negative.    Physical Exam Updated Vital Signs BP (!) 151/76   Pulse 63   Temp 97.7 F (36.5 C) (Oral)   Resp 15   Ht 1.473 m (4\' 10" )   Wt 64.4 kg   SpO2 100%   BMI 29.68 kg/m   Physical Exam Vitals signs and nursing note reviewed.  Constitutional:      General: She is not in acute  distress.    Appearance: She is well-developed.  HENT:     Head: Normocephalic and atraumatic.     Mouth/Throat:     Pharynx: No oropharyngeal exudate.  Eyes:     General: No scleral icterus.       Right eye: No discharge.        Left eye: No discharge.     Conjunctiva/sclera: Conjunctivae normal.     Pupils: Pupils are equal, round, and reactive to light.  Neck:     Musculoskeletal: Normal range of motion and neck supple.     Thyroid: No thyromegaly.     Vascular: No JVD.  Cardiovascular:     Rate and Rhythm: Normal rate and regular rhythm.     Heart sounds: Normal heart sounds. No murmur. No friction rub. No gallop.   Pulmonary:     Effort: Pulmonary effort is normal. No respiratory distress.     Breath sounds: Normal breath sounds. No wheezing or rales.  Abdominal:     General: Bowel sounds are normal. There is no distension.     Palpations: Abdomen is soft. There is no mass.     Tenderness: There is no abdominal tenderness.  Musculoskeletal: Normal range of motion.        General: No tenderness.  Lymphadenopathy:     Cervical: No cervical adenopathy.  Skin:    General: Skin is warm and dry.     Findings: No erythema or rash.  Neurological:     Mental Status: She is alert.     Coordination: Coordination normal.     Comments: Normal finger-nose-finger, normal strength and sensation of the bilateral upper and lower extremities, cranial nerves III through XII are normal  Psychiatric:        Behavior: Behavior normal.      ED Treatments / Results  Labs (all labs ordered are listed, but only abnormal results are displayed) Labs Reviewed - No data to display  EKG None  Radiology No results found.  Procedures Procedures (including critical care time)  Medications Ordered in ED Medications - No data to display   Initial Impression / Assessment and Plan / ED Course  I have reviewed the triage vital signs and the nursing notes.  Pertinent labs & imaging  results that were available during my care of the patient were reviewed by me and considered in my medical decision making (see chart for details).     There is no reproducible tenderness to palpation over the chest wall the shoulder or the neck.  She has normal range of motion of all the major joints with normal neurologic exam.  Her EKG is very normal as well and she does not have any exertional symptoms to suggest that this is a cardiac source.  The patient will be given an anti-inflammatory, muscle relaxant and asked to follow-up with her family doctor.  She is aware of the indications for return, stable for discharge.  Final Clinical Impressions(s) / ED Diagnoses   Final diagnoses:  Left arm pain    ED Discharge Orders         Ordered    methocarbamol (ROBAXIN) 500 MG tablet  2 times daily PRN     12/11/18 1223    ibuprofen (ADVIL,MOTRIN) 600 MG tablet  Every 6 hours PRN     12/11/18 1223           Noemi Chapel, MD 12/14/18 1206

## 2018-12-11 NOTE — Discharge Instructions (Signed)
Your EKG looks totally normal showing no signs of heart disease.  I would like for you to follow-up with your doctor in 1 week if you have any recurrent symptoms otherwise please take the following medications.  Robaxin, 500 mg by mouth twice a day as needed for muscle spasms or tightness  Ibuprofen up to 600 mg 3 times daily as needed for pain  Seek medical exam for increasing chest pain or difficulty breathing especially with exertion

## 2018-12-26 ENCOUNTER — Ambulatory Visit (HOSPITAL_COMMUNITY)
Admission: RE | Admit: 2018-12-26 | Discharge: 2018-12-26 | Disposition: A | Discharge status: Home / Self Care | Payer: Medicare Other | Source: Ambulatory Visit | Attending: Internal Medicine | Admitting: Internal Medicine

## 2018-12-26 DIAGNOSIS — Z1231 Encounter for screening mammogram for malignant neoplasm of breast: Secondary | ICD-10-CM | POA: Diagnosis not present

## 2020-02-06 DIAGNOSIS — F17210 Nicotine dependence, cigarettes, uncomplicated: Secondary | ICD-10-CM | POA: Diagnosis not present

## 2020-02-06 DIAGNOSIS — J449 Chronic obstructive pulmonary disease, unspecified: Secondary | ICD-10-CM | POA: Diagnosis not present

## 2020-02-06 DIAGNOSIS — Z299 Encounter for prophylactic measures, unspecified: Secondary | ICD-10-CM | POA: Diagnosis not present

## 2020-02-06 DIAGNOSIS — R300 Dysuria: Secondary | ICD-10-CM | POA: Diagnosis not present

## 2020-03-27 DIAGNOSIS — R202 Paresthesia of skin: Secondary | ICD-10-CM | POA: Diagnosis not present

## 2020-03-27 DIAGNOSIS — M797 Fibromyalgia: Secondary | ICD-10-CM | POA: Diagnosis not present

## 2020-03-27 DIAGNOSIS — M2550 Pain in unspecified joint: Secondary | ICD-10-CM | POA: Diagnosis not present

## 2020-03-27 DIAGNOSIS — Z299 Encounter for prophylactic measures, unspecified: Secondary | ICD-10-CM | POA: Diagnosis not present

## 2020-03-27 DIAGNOSIS — F17210 Nicotine dependence, cigarettes, uncomplicated: Secondary | ICD-10-CM | POA: Diagnosis not present

## 2020-04-03 DIAGNOSIS — F17210 Nicotine dependence, cigarettes, uncomplicated: Secondary | ICD-10-CM | POA: Diagnosis not present

## 2020-04-03 DIAGNOSIS — J449 Chronic obstructive pulmonary disease, unspecified: Secondary | ICD-10-CM | POA: Diagnosis not present

## 2020-04-03 DIAGNOSIS — E538 Deficiency of other specified B group vitamins: Secondary | ICD-10-CM | POA: Diagnosis not present

## 2020-04-03 DIAGNOSIS — Z299 Encounter for prophylactic measures, unspecified: Secondary | ICD-10-CM | POA: Diagnosis not present

## 2020-04-03 DIAGNOSIS — E559 Vitamin D deficiency, unspecified: Secondary | ICD-10-CM | POA: Diagnosis not present

## 2020-04-04 DIAGNOSIS — I70209 Unspecified atherosclerosis of native arteries of extremities, unspecified extremity: Secondary | ICD-10-CM | POA: Diagnosis not present

## 2020-04-04 DIAGNOSIS — J449 Chronic obstructive pulmonary disease, unspecified: Secondary | ICD-10-CM | POA: Diagnosis not present

## 2020-04-04 DIAGNOSIS — E538 Deficiency of other specified B group vitamins: Secondary | ICD-10-CM | POA: Diagnosis not present

## 2020-04-04 DIAGNOSIS — E559 Vitamin D deficiency, unspecified: Secondary | ICD-10-CM | POA: Diagnosis not present

## 2020-04-04 DIAGNOSIS — Z299 Encounter for prophylactic measures, unspecified: Secondary | ICD-10-CM | POA: Diagnosis not present

## 2020-04-05 DIAGNOSIS — E538 Deficiency of other specified B group vitamins: Secondary | ICD-10-CM | POA: Diagnosis not present

## 2020-04-17 DIAGNOSIS — Z299 Encounter for prophylactic measures, unspecified: Secondary | ICD-10-CM | POA: Diagnosis not present

## 2020-04-17 DIAGNOSIS — I70209 Unspecified atherosclerosis of native arteries of extremities, unspecified extremity: Secondary | ICD-10-CM | POA: Diagnosis not present

## 2020-04-17 DIAGNOSIS — E538 Deficiency of other specified B group vitamins: Secondary | ICD-10-CM | POA: Diagnosis not present

## 2020-04-17 DIAGNOSIS — J449 Chronic obstructive pulmonary disease, unspecified: Secondary | ICD-10-CM | POA: Diagnosis not present

## 2020-04-25 DIAGNOSIS — F17210 Nicotine dependence, cigarettes, uncomplicated: Secondary | ICD-10-CM | POA: Diagnosis not present

## 2020-04-25 DIAGNOSIS — E538 Deficiency of other specified B group vitamins: Secondary | ICD-10-CM | POA: Diagnosis not present

## 2020-04-25 DIAGNOSIS — Z299 Encounter for prophylactic measures, unspecified: Secondary | ICD-10-CM | POA: Diagnosis not present

## 2020-04-25 DIAGNOSIS — J449 Chronic obstructive pulmonary disease, unspecified: Secondary | ICD-10-CM | POA: Diagnosis not present

## 2020-04-25 DIAGNOSIS — I70209 Unspecified atherosclerosis of native arteries of extremities, unspecified extremity: Secondary | ICD-10-CM | POA: Diagnosis not present

## 2020-05-02 DIAGNOSIS — E538 Deficiency of other specified B group vitamins: Secondary | ICD-10-CM | POA: Diagnosis not present

## 2020-05-02 DIAGNOSIS — J449 Chronic obstructive pulmonary disease, unspecified: Secondary | ICD-10-CM | POA: Diagnosis not present

## 2020-05-02 DIAGNOSIS — I70209 Unspecified atherosclerosis of native arteries of extremities, unspecified extremity: Secondary | ICD-10-CM | POA: Diagnosis not present

## 2020-05-02 DIAGNOSIS — Z299 Encounter for prophylactic measures, unspecified: Secondary | ICD-10-CM | POA: Diagnosis not present

## 2020-06-04 DIAGNOSIS — J449 Chronic obstructive pulmonary disease, unspecified: Secondary | ICD-10-CM | POA: Diagnosis not present

## 2020-06-04 DIAGNOSIS — F17210 Nicotine dependence, cigarettes, uncomplicated: Secondary | ICD-10-CM | POA: Diagnosis not present

## 2020-06-04 DIAGNOSIS — Z299 Encounter for prophylactic measures, unspecified: Secondary | ICD-10-CM | POA: Diagnosis not present

## 2020-06-04 DIAGNOSIS — I70209 Unspecified atherosclerosis of native arteries of extremities, unspecified extremity: Secondary | ICD-10-CM | POA: Diagnosis not present

## 2020-06-04 DIAGNOSIS — E538 Deficiency of other specified B group vitamins: Secondary | ICD-10-CM | POA: Diagnosis not present

## 2020-06-10 DIAGNOSIS — F17210 Nicotine dependence, cigarettes, uncomplicated: Secondary | ICD-10-CM | POA: Diagnosis not present

## 2020-06-10 DIAGNOSIS — Z299 Encounter for prophylactic measures, unspecified: Secondary | ICD-10-CM | POA: Diagnosis not present

## 2020-06-10 DIAGNOSIS — Z713 Dietary counseling and surveillance: Secondary | ICD-10-CM | POA: Diagnosis not present

## 2020-06-10 DIAGNOSIS — M797 Fibromyalgia: Secondary | ICD-10-CM | POA: Diagnosis not present

## 2020-07-03 DIAGNOSIS — E538 Deficiency of other specified B group vitamins: Secondary | ICD-10-CM | POA: Diagnosis not present

## 2020-07-04 DIAGNOSIS — D582 Other hemoglobinopathies: Secondary | ICD-10-CM | POA: Diagnosis not present

## 2020-07-04 DIAGNOSIS — Z299 Encounter for prophylactic measures, unspecified: Secondary | ICD-10-CM | POA: Diagnosis not present

## 2020-07-04 DIAGNOSIS — E538 Deficiency of other specified B group vitamins: Secondary | ICD-10-CM | POA: Diagnosis not present

## 2020-07-04 DIAGNOSIS — J449 Chronic obstructive pulmonary disease, unspecified: Secondary | ICD-10-CM | POA: Diagnosis not present

## 2020-07-11 DIAGNOSIS — E538 Deficiency of other specified B group vitamins: Secondary | ICD-10-CM | POA: Diagnosis not present

## 2020-07-11 DIAGNOSIS — I70209 Unspecified atherosclerosis of native arteries of extremities, unspecified extremity: Secondary | ICD-10-CM | POA: Diagnosis not present

## 2020-07-11 DIAGNOSIS — Z299 Encounter for prophylactic measures, unspecified: Secondary | ICD-10-CM | POA: Diagnosis not present

## 2020-07-11 DIAGNOSIS — J449 Chronic obstructive pulmonary disease, unspecified: Secondary | ICD-10-CM | POA: Diagnosis not present

## 2020-07-18 DIAGNOSIS — J449 Chronic obstructive pulmonary disease, unspecified: Secondary | ICD-10-CM | POA: Diagnosis not present

## 2020-07-18 DIAGNOSIS — E538 Deficiency of other specified B group vitamins: Secondary | ICD-10-CM | POA: Diagnosis not present

## 2020-07-18 DIAGNOSIS — I70209 Unspecified atherosclerosis of native arteries of extremities, unspecified extremity: Secondary | ICD-10-CM | POA: Diagnosis not present

## 2020-07-18 DIAGNOSIS — Z299 Encounter for prophylactic measures, unspecified: Secondary | ICD-10-CM | POA: Diagnosis not present

## 2020-07-22 DIAGNOSIS — H0263 Xanthelasma of right eye, unspecified eyelid: Secondary | ICD-10-CM | POA: Diagnosis not present

## 2020-07-22 DIAGNOSIS — L821 Other seborrheic keratosis: Secondary | ICD-10-CM | POA: Diagnosis not present

## 2020-07-25 DIAGNOSIS — Z299 Encounter for prophylactic measures, unspecified: Secondary | ICD-10-CM | POA: Diagnosis not present

## 2020-07-25 DIAGNOSIS — E538 Deficiency of other specified B group vitamins: Secondary | ICD-10-CM | POA: Diagnosis not present

## 2020-07-25 DIAGNOSIS — I70209 Unspecified atherosclerosis of native arteries of extremities, unspecified extremity: Secondary | ICD-10-CM | POA: Diagnosis not present

## 2020-07-25 DIAGNOSIS — J449 Chronic obstructive pulmonary disease, unspecified: Secondary | ICD-10-CM | POA: Diagnosis not present

## 2020-08-01 DIAGNOSIS — I70209 Unspecified atherosclerosis of native arteries of extremities, unspecified extremity: Secondary | ICD-10-CM | POA: Diagnosis not present

## 2020-08-01 DIAGNOSIS — E538 Deficiency of other specified B group vitamins: Secondary | ICD-10-CM | POA: Diagnosis not present

## 2020-08-01 DIAGNOSIS — J449 Chronic obstructive pulmonary disease, unspecified: Secondary | ICD-10-CM | POA: Diagnosis not present

## 2020-08-01 DIAGNOSIS — Z299 Encounter for prophylactic measures, unspecified: Secondary | ICD-10-CM | POA: Diagnosis not present

## 2020-08-08 DIAGNOSIS — J449 Chronic obstructive pulmonary disease, unspecified: Secondary | ICD-10-CM | POA: Diagnosis not present

## 2020-08-08 DIAGNOSIS — I70209 Unspecified atherosclerosis of native arteries of extremities, unspecified extremity: Secondary | ICD-10-CM | POA: Diagnosis not present

## 2020-08-08 DIAGNOSIS — Z299 Encounter for prophylactic measures, unspecified: Secondary | ICD-10-CM | POA: Diagnosis not present

## 2020-08-08 DIAGNOSIS — E538 Deficiency of other specified B group vitamins: Secondary | ICD-10-CM | POA: Diagnosis not present

## 2020-08-13 DIAGNOSIS — R5383 Other fatigue: Secondary | ICD-10-CM | POA: Diagnosis not present

## 2020-08-13 DIAGNOSIS — Z79899 Other long term (current) drug therapy: Secondary | ICD-10-CM | POA: Diagnosis not present

## 2020-08-13 DIAGNOSIS — Z0000 Encounter for general adult medical examination without abnormal findings: Secondary | ICD-10-CM | POA: Diagnosis not present

## 2020-08-13 DIAGNOSIS — E559 Vitamin D deficiency, unspecified: Secondary | ICD-10-CM | POA: Diagnosis not present

## 2020-08-13 DIAGNOSIS — F17210 Nicotine dependence, cigarettes, uncomplicated: Secondary | ICD-10-CM | POA: Diagnosis not present

## 2020-08-13 DIAGNOSIS — Z299 Encounter for prophylactic measures, unspecified: Secondary | ICD-10-CM | POA: Diagnosis not present

## 2020-08-13 DIAGNOSIS — E7800 Pure hypercholesterolemia, unspecified: Secondary | ICD-10-CM | POA: Diagnosis not present

## 2020-08-13 DIAGNOSIS — Z7189 Other specified counseling: Secondary | ICD-10-CM | POA: Diagnosis not present

## 2020-08-13 DIAGNOSIS — E538 Deficiency of other specified B group vitamins: Secondary | ICD-10-CM | POA: Diagnosis not present

## 2020-08-20 ENCOUNTER — Other Ambulatory Visit (HOSPITAL_COMMUNITY): Payer: Self-pay | Admitting: Internal Medicine

## 2020-08-20 DIAGNOSIS — Z1231 Encounter for screening mammogram for malignant neoplasm of breast: Secondary | ICD-10-CM

## 2020-08-22 DIAGNOSIS — E7849 Other hyperlipidemia: Secondary | ICD-10-CM | POA: Diagnosis not present

## 2020-08-22 DIAGNOSIS — J449 Chronic obstructive pulmonary disease, unspecified: Secondary | ICD-10-CM | POA: Diagnosis not present

## 2020-08-22 DIAGNOSIS — Z720 Tobacco use: Secondary | ICD-10-CM | POA: Diagnosis not present

## 2020-08-23 ENCOUNTER — Ambulatory Visit (HOSPITAL_COMMUNITY): Payer: Medicare Other

## 2020-09-04 DIAGNOSIS — J449 Chronic obstructive pulmonary disease, unspecified: Secondary | ICD-10-CM | POA: Diagnosis not present

## 2020-09-04 DIAGNOSIS — Z299 Encounter for prophylactic measures, unspecified: Secondary | ICD-10-CM | POA: Diagnosis not present

## 2020-09-04 DIAGNOSIS — I70209 Unspecified atherosclerosis of native arteries of extremities, unspecified extremity: Secondary | ICD-10-CM | POA: Diagnosis not present

## 2020-09-04 DIAGNOSIS — K219 Gastro-esophageal reflux disease without esophagitis: Secondary | ICD-10-CM | POA: Diagnosis not present

## 2020-09-04 DIAGNOSIS — F17210 Nicotine dependence, cigarettes, uncomplicated: Secondary | ICD-10-CM | POA: Diagnosis not present

## 2020-09-05 ENCOUNTER — Other Ambulatory Visit: Payer: Self-pay

## 2020-09-05 ENCOUNTER — Ambulatory Visit (HOSPITAL_COMMUNITY)
Admission: RE | Admit: 2020-09-05 | Discharge: 2020-09-05 | Disposition: A | Discharge status: Home / Self Care | Payer: Medicare Other | Source: Ambulatory Visit | Attending: Internal Medicine | Admitting: Internal Medicine

## 2020-09-05 DIAGNOSIS — Z1231 Encounter for screening mammogram for malignant neoplasm of breast: Secondary | ICD-10-CM | POA: Insufficient documentation

## 2020-09-12 DIAGNOSIS — E538 Deficiency of other specified B group vitamins: Secondary | ICD-10-CM | POA: Diagnosis not present

## 2020-09-12 DIAGNOSIS — I70209 Unspecified atherosclerosis of native arteries of extremities, unspecified extremity: Secondary | ICD-10-CM | POA: Diagnosis not present

## 2020-09-12 DIAGNOSIS — F17210 Nicotine dependence, cigarettes, uncomplicated: Secondary | ICD-10-CM | POA: Diagnosis not present

## 2020-09-12 DIAGNOSIS — J449 Chronic obstructive pulmonary disease, unspecified: Secondary | ICD-10-CM | POA: Diagnosis not present

## 2020-09-12 DIAGNOSIS — Z299 Encounter for prophylactic measures, unspecified: Secondary | ICD-10-CM | POA: Diagnosis not present

## 2020-09-20 DIAGNOSIS — E7849 Other hyperlipidemia: Secondary | ICD-10-CM | POA: Diagnosis not present

## 2020-09-20 DIAGNOSIS — J449 Chronic obstructive pulmonary disease, unspecified: Secondary | ICD-10-CM | POA: Diagnosis not present

## 2020-09-20 DIAGNOSIS — Z720 Tobacco use: Secondary | ICD-10-CM | POA: Diagnosis not present

## 2020-10-02 DIAGNOSIS — H0264 Xanthelasma of left upper eyelid: Secondary | ICD-10-CM | POA: Diagnosis not present

## 2020-10-15 DIAGNOSIS — F17210 Nicotine dependence, cigarettes, uncomplicated: Secondary | ICD-10-CM | POA: Diagnosis not present

## 2020-10-15 DIAGNOSIS — J449 Chronic obstructive pulmonary disease, unspecified: Secondary | ICD-10-CM | POA: Diagnosis not present

## 2020-10-15 DIAGNOSIS — E538 Deficiency of other specified B group vitamins: Secondary | ICD-10-CM | POA: Diagnosis not present

## 2020-10-15 DIAGNOSIS — I70209 Unspecified atherosclerosis of native arteries of extremities, unspecified extremity: Secondary | ICD-10-CM | POA: Diagnosis not present

## 2020-10-15 DIAGNOSIS — Z299 Encounter for prophylactic measures, unspecified: Secondary | ICD-10-CM | POA: Diagnosis not present

## 2020-11-05 DIAGNOSIS — H0265 Xanthelasma of left lower eyelid: Secondary | ICD-10-CM | POA: Diagnosis not present

## 2020-11-05 DIAGNOSIS — D2239 Melanocytic nevi of other parts of face: Secondary | ICD-10-CM | POA: Diagnosis not present

## 2020-11-11 ENCOUNTER — Emergency Department (HOSPITAL_COMMUNITY)
Admission: EM | Admit: 2020-11-11 | Discharge: 2020-11-12 | Disposition: A | Discharge status: Home / Self Care | Payer: Medicare Other | Attending: Emergency Medicine | Admitting: Emergency Medicine

## 2020-11-11 ENCOUNTER — Encounter (HOSPITAL_COMMUNITY): Payer: Self-pay | Admitting: *Deleted

## 2020-11-11 ENCOUNTER — Other Ambulatory Visit: Payer: Self-pay

## 2020-11-11 DIAGNOSIS — Z79899 Other long term (current) drug therapy: Secondary | ICD-10-CM | POA: Insufficient documentation

## 2020-11-11 DIAGNOSIS — S46812A Strain of other muscles, fascia and tendons at shoulder and upper arm level, left arm, initial encounter: Secondary | ICD-10-CM | POA: Insufficient documentation

## 2020-11-11 DIAGNOSIS — F17200 Nicotine dependence, unspecified, uncomplicated: Secondary | ICD-10-CM | POA: Diagnosis not present

## 2020-11-11 DIAGNOSIS — X58XXXA Exposure to other specified factors, initial encounter: Secondary | ICD-10-CM | POA: Diagnosis not present

## 2020-11-11 DIAGNOSIS — S4992XA Unspecified injury of left shoulder and upper arm, initial encounter: Secondary | ICD-10-CM | POA: Diagnosis present

## 2020-11-11 MED ORDER — NAPROXEN 500 MG PO TABS: 500.0000 mg | ORAL_TABLET | Freq: Two times a day (BID) | ORAL | 0 refills | Status: DC

## 2020-11-11 MED ORDER — METHOCARBAMOL 500 MG PO TABS
500.0000 mg | ORAL_TABLET | Freq: Once | ORAL | Status: AC
Start: 2020-11-11 — End: 2020-11-11
  Administered 2020-11-11: 500 mg via ORAL
  Filled 2020-11-11: qty 1

## 2020-11-11 MED ORDER — METHOCARBAMOL 500 MG PO TABS: ORAL_TABLET | ORAL | 0 refills | Status: DC

## 2020-11-11 MED ORDER — KETOROLAC TROMETHAMINE 30 MG/ML IJ SOLN
30.0000 mg | Freq: Once | INTRAMUSCULAR | Status: AC
  Administered 2020-11-11: 30 mg via INTRAMUSCULAR
  Filled 2020-11-11: qty 1

## 2020-11-11 NOTE — ED Triage Notes (Signed)
States pain hurts more with concern movements.

## 2020-11-11 NOTE — ED Triage Notes (Signed)
Pt with upper left back pain that radiates down left arm and up left neck.  Pins and needles to left arm.  Ongoing x 3 days.

## 2020-11-11 NOTE — Discharge Instructions (Addendum)
Use ice and heat for comfort. Take the medications as prescribed.  Recheck with Dr. Aline Brochure if you are not improving over the next week.

## 2020-11-11 NOTE — ED Provider Notes (Signed)
[facility] EMERGENCY DEPARTMENT Provider Note   CSN: [id number] Arrival date & time: 11/11/20  2242   Time seen 11:15 PM  History Chief Complaint  Patient presents with  . Arm Pain    Diamond Adams is a 59 y.o. female.  HPI   Patient states 3 days ago she started getting pain around her left shoulder blade that now radiates up to her left side of her neck and into her left arm.  She states she has pins-and-needles feeling in her thumb and index finger.  She states if she moves her head backward that makes it worse.  Nothing makes it feel better.  She denies any change in her activity or injury.  She has never had this happen before.  Patient is right-handed.  She denies history of diabetes.    PCP Glenda Chroman, MD   Past Medical History:  Diagnosis Date  . Anxiety disorder   . Depression   . Fibromyalgia   . Herpes   . PONV (postoperative nausea and vomiting)     There are no problems to display for this patient.   Past Surgical History:  Procedure Laterality Date  . ABDOMINAL HYSTERECTOMY  1994  . CARPAL TUNNEL RELEASE  2013,2011   right and left  . EXCISION NASAL MASS Left 09/25/2013   Procedure: EXCISION NASAL CANCER WITH SPLIT THICKNESS SKIN GRAFT FROM RIGHT THIGH;  Surgeon: Ascencion Dike, MD;  Location: [place];  Service: ENT;  Laterality: Left;  . RIGHT OOPHORECTOMY    . THUMB ARTHROSCOPY     right  . TONSILLECTOMY    . TUBAL LIGATION       OB History    Gravida  3   Para  3   Term      Preterm      AB      Living  3     SAB      IAB      Ectopic      Multiple      Live Births              Family History  Problem Relation Age of Onset  . Heart disease Father   . Emphysema Mother   . Cancer Sister        cervical    Social History   Tobacco Use  . Smoking status: Current Every Day Smoker    Packs/day: 0.50  . Smokeless tobacco: Never Used  Vaping Use  . Vaping Use: Never used  Substance Use Topics   . Alcohol use: No  . Drug use: No    Home Medications Prior to Admission medications   Medication Sig Start Date End Date Taking? Authorizing Provider  acetaminophen (TYLENOL) 500 MG tablet Take 500 mg by mouth every 6 (six) hours as needed for mild pain or moderate pain.    [provider]  conjugated estrogens (PREMARIN) vaginal cream Place 1 Applicatorful vaginally daily. 09/20/17   Florian Buff, MD  FLUoxetine (PROZAC) 10 MG capsule Take 10 mg by mouth daily.    [provider]  ibuprofen (ADVIL,MOTRIN) 600 MG tablet Take 1 tablet (600 mg total) by mouth every 6 (six) hours as needed. 12/11/18   Noemi Chapel, MD  methocarbamol (ROBAXIN) 500 MG tablet Take 1 or 2 po Q 6hrs for muscle pain 11/11/20   Rolland Porter, MD  naproxen (NAPROSYN) 500 MG tablet Take 1 tablet (500 mg total) by mouth 2 (two)  times daily. 11/11/20   Rolland Porter, MD  pravastatin (PRAVACHOL) 40 MG tablet Take 40 mg by mouth daily.     [provider]    Allergies    Trazodone and nefazodone  Review of Systems   Review of Systems  All other systems reviewed and are negative.   Physical Exam Updated Vital Signs BP (!) 146/61   Pulse 63   Temp 97.7 F (36.5 C) (Oral)   Resp 16   Ht 4\' 10"  (1.473 m)   Wt 68 kg   SpO2 99%   BMI 31.35 kg/m   Physical Exam Vitals and nursing note reviewed.  Constitutional:      General: She is in acute distress.     Appearance: Normal appearance. She is obese. She is not ill-appearing or toxic-appearing.  HENT:     Head: Normocephalic and atraumatic.     Right Ear: External ear normal.     Left Ear: External ear normal.  Eyes:     Extraocular Movements: Extraocular movements intact.     Conjunctiva/sclera: Conjunctivae normal.     Pupils: Pupils are equal, round, and reactive to light.  Neck:   Cardiovascular:     Rate and Rhythm: Normal rate.  Pulmonary:     Effort: Pulmonary effort is normal. No respiratory distress.  Musculoskeletal:      Cervical back: Normal range of motion. No tenderness.       Back:     Comments: Patient is tender along the course of the left trapezius.  When I do range of motion of her head she has no pain looking to the left or right but if she extends her head backward it causes pain in that area.  With extreme forward flexion of her left upper extremity that causes some discomfort.  There is no pain with abduction.  She has good distal pulses and capillary refill.  Neurological:     General: No focal deficit present.     Mental Status: She is alert and oriented to person, place, and time.     Cranial Nerves: No cranial nerve deficit.  Psychiatric:        Mood and Affect: Mood normal.        Behavior: Behavior normal.        Thought Content: Thought content normal.     ED Results / Procedures / Treatments   Labs (all labs ordered are listed, but only abnormal results are displayed) Labs Reviewed - No data to display  EKG None  Radiology No results found.  Procedures Procedures (including critical care time)  Medications Ordered in ED Medications  ketorolac (TORADOL) 30 MG/ML injection 30 mg (has no administration in time range)  methocarbamol (ROBAXIN) tablet 500 mg (has no administration in time range)    ED Course  I have reviewed the triage vital signs and the nursing notes.  Pertinent labs & imaging results that were available during my care of the patient were reviewed by me and considered in my medical decision making (see chart for details).    MDM Rules/Calculators/A&P                          Patient appears to have some inflammation of her trapezius muscle on the left with trigger point around the medial aspect of the left scapula.  She denies any change in activity or injury.  I expect the tingling in her thumb and index finger are for  pressure on the nerve going through the inflamed muscle and should resolve.  She was advised to use ice and heat.  She states she is  on a muscle relaxer baclofen.  I am going to put her on a different muscle relaxer Robaxin.  She was given Toradol IM.  Patient drove herself to the ED.  She denies history of diabetes.  She was referred to Dr. Aline Brochure if she was not improving.   Final Clinical Impression(s) / ED Diagnoses Final diagnoses:  Trapezius muscle strain, left, initial encounter    Rx / DC Orders ED Discharge Orders         Ordered    naproxen (NAPROSYN) 500 MG tablet  2 times daily        11/11/20 2348    methocarbamol (ROBAXIN) 500 MG tablet        11/11/20 2348         Plan discharge  Rolland Porter, MD, Barbette Or, MD 11/11/20 [phone]

## 2020-11-14 DIAGNOSIS — M549 Dorsalgia, unspecified: Secondary | ICD-10-CM | POA: Diagnosis not present

## 2020-11-14 DIAGNOSIS — E538 Deficiency of other specified B group vitamins: Secondary | ICD-10-CM | POA: Diagnosis not present

## 2020-11-14 DIAGNOSIS — M62830 Muscle spasm of back: Secondary | ICD-10-CM | POA: Diagnosis not present

## 2020-11-14 DIAGNOSIS — Z299 Encounter for prophylactic measures, unspecified: Secondary | ICD-10-CM | POA: Diagnosis not present

## 2020-11-14 DIAGNOSIS — J449 Chronic obstructive pulmonary disease, unspecified: Secondary | ICD-10-CM | POA: Diagnosis not present

## 2020-11-14 DIAGNOSIS — F17210 Nicotine dependence, cigarettes, uncomplicated: Secondary | ICD-10-CM | POA: Diagnosis not present

## 2020-12-17 DIAGNOSIS — E538 Deficiency of other specified B group vitamins: Secondary | ICD-10-CM | POA: Diagnosis not present

## 2020-12-23 DIAGNOSIS — Z720 Tobacco use: Secondary | ICD-10-CM | POA: Diagnosis not present

## 2020-12-23 DIAGNOSIS — J449 Chronic obstructive pulmonary disease, unspecified: Secondary | ICD-10-CM | POA: Diagnosis not present

## 2020-12-23 DIAGNOSIS — E7849 Other hyperlipidemia: Secondary | ICD-10-CM | POA: Diagnosis not present

## 2021-01-20 DIAGNOSIS — Z299 Encounter for prophylactic measures, unspecified: Secondary | ICD-10-CM | POA: Diagnosis not present

## 2021-01-20 DIAGNOSIS — D582 Other hemoglobinopathies: Secondary | ICD-10-CM | POA: Diagnosis not present

## 2021-01-20 DIAGNOSIS — J449 Chronic obstructive pulmonary disease, unspecified: Secondary | ICD-10-CM | POA: Diagnosis not present

## 2021-01-20 DIAGNOSIS — E538 Deficiency of other specified B group vitamins: Secondary | ICD-10-CM | POA: Diagnosis not present

## 2021-02-11 DIAGNOSIS — H02151 Paralytic ectropion of right upper eyelid: Secondary | ICD-10-CM | POA: Diagnosis not present

## 2021-02-11 DIAGNOSIS — H0261 Xanthelasma of right upper eyelid: Secondary | ICD-10-CM | POA: Diagnosis not present

## 2021-03-04 DIAGNOSIS — Z299 Encounter for prophylactic measures, unspecified: Secondary | ICD-10-CM | POA: Diagnosis not present

## 2021-03-04 DIAGNOSIS — I70209 Unspecified atherosclerosis of native arteries of extremities, unspecified extremity: Secondary | ICD-10-CM | POA: Diagnosis not present

## 2021-03-04 DIAGNOSIS — J449 Chronic obstructive pulmonary disease, unspecified: Secondary | ICD-10-CM | POA: Diagnosis not present

## 2021-03-04 DIAGNOSIS — E538 Deficiency of other specified B group vitamins: Secondary | ICD-10-CM | POA: Diagnosis not present

## 2021-03-04 DIAGNOSIS — F17210 Nicotine dependence, cigarettes, uncomplicated: Secondary | ICD-10-CM | POA: Diagnosis not present

## 2021-04-09 DIAGNOSIS — Z299 Encounter for prophylactic measures, unspecified: Secondary | ICD-10-CM | POA: Diagnosis not present

## 2021-04-09 DIAGNOSIS — E538 Deficiency of other specified B group vitamins: Secondary | ICD-10-CM | POA: Diagnosis not present

## 2021-04-09 DIAGNOSIS — Z713 Dietary counseling and surveillance: Secondary | ICD-10-CM | POA: Diagnosis not present

## 2021-04-09 DIAGNOSIS — R5383 Other fatigue: Secondary | ICD-10-CM | POA: Diagnosis not present

## 2021-04-21 DIAGNOSIS — M810 Age-related osteoporosis without current pathological fracture: Secondary | ICD-10-CM | POA: Diagnosis not present

## 2021-04-21 DIAGNOSIS — E1165 Type 2 diabetes mellitus with hyperglycemia: Secondary | ICD-10-CM | POA: Diagnosis not present

## 2021-04-21 DIAGNOSIS — G4700 Insomnia, unspecified: Secondary | ICD-10-CM | POA: Diagnosis not present

## 2021-04-21 DIAGNOSIS — K219 Gastro-esophageal reflux disease without esophagitis: Secondary | ICD-10-CM | POA: Diagnosis not present

## 2021-06-03 DIAGNOSIS — R5383 Other fatigue: Secondary | ICD-10-CM | POA: Diagnosis not present

## 2021-06-03 DIAGNOSIS — Z713 Dietary counseling and surveillance: Secondary | ICD-10-CM | POA: Diagnosis not present

## 2021-06-03 DIAGNOSIS — Z299 Encounter for prophylactic measures, unspecified: Secondary | ICD-10-CM | POA: Diagnosis not present

## 2021-06-03 DIAGNOSIS — E538 Deficiency of other specified B group vitamins: Secondary | ICD-10-CM | POA: Diagnosis not present

## 2021-06-03 LAB — VITAMIN B12: Vitamin B-12: 379

## 2021-06-10 ENCOUNTER — Encounter (INDEPENDENT_AMBULATORY_CARE_PROVIDER_SITE_OTHER): Payer: Self-pay | Admitting: Family Medicine

## 2021-06-10 ENCOUNTER — Ambulatory Visit (INDEPENDENT_AMBULATORY_CARE_PROVIDER_SITE_OTHER): Payer: Medicare Other | Admitting: Family Medicine

## 2021-06-10 ENCOUNTER — Encounter (INDEPENDENT_AMBULATORY_CARE_PROVIDER_SITE_OTHER): Payer: Self-pay

## 2021-06-10 DIAGNOSIS — Z1331 Encounter for screening for depression: Secondary | ICD-10-CM

## 2021-06-10 DIAGNOSIS — R0602 Shortness of breath: Secondary | ICD-10-CM

## 2021-06-10 DIAGNOSIS — R5383 Other fatigue: Secondary | ICD-10-CM

## 2021-06-10 NOTE — Progress Notes (Signed)
Last B 12 was done on 06/03/2021 at [facility]

## 2021-06-17 DIAGNOSIS — J449 Chronic obstructive pulmonary disease, unspecified: Secondary | ICD-10-CM | POA: Diagnosis not present

## 2021-06-17 DIAGNOSIS — R5383 Other fatigue: Secondary | ICD-10-CM | POA: Diagnosis not present

## 2021-06-17 DIAGNOSIS — K5900 Constipation, unspecified: Secondary | ICD-10-CM | POA: Diagnosis not present

## 2021-06-17 DIAGNOSIS — J441 Chronic obstructive pulmonary disease with (acute) exacerbation: Secondary | ICD-10-CM | POA: Diagnosis not present

## 2021-06-17 DIAGNOSIS — R197 Diarrhea, unspecified: Secondary | ICD-10-CM | POA: Diagnosis not present

## 2021-06-17 DIAGNOSIS — Z299 Encounter for prophylactic measures, unspecified: Secondary | ICD-10-CM | POA: Diagnosis not present

## 2021-06-17 DIAGNOSIS — Z8719 Personal history of other diseases of the digestive system: Secondary | ICD-10-CM | POA: Diagnosis not present

## 2021-06-17 DIAGNOSIS — E538 Deficiency of other specified B group vitamins: Secondary | ICD-10-CM | POA: Diagnosis not present

## 2021-06-17 DIAGNOSIS — R109 Unspecified abdominal pain: Secondary | ICD-10-CM | POA: Diagnosis not present

## 2021-06-23 DIAGNOSIS — K5792 Diverticulitis of intestine, part unspecified, without perforation or abscess without bleeding: Secondary | ICD-10-CM | POA: Diagnosis not present

## 2021-06-23 DIAGNOSIS — Z299 Encounter for prophylactic measures, unspecified: Secondary | ICD-10-CM | POA: Diagnosis not present

## 2021-06-24 ENCOUNTER — Ambulatory Visit (INDEPENDENT_AMBULATORY_CARE_PROVIDER_SITE_OTHER): Payer: Medicare Other | Admitting: Family Medicine

## 2021-07-09 DIAGNOSIS — E538 Deficiency of other specified B group vitamins: Secondary | ICD-10-CM | POA: Diagnosis not present

## 2021-07-23 DIAGNOSIS — K219 Gastro-esophageal reflux disease without esophagitis: Secondary | ICD-10-CM | POA: Diagnosis not present

## 2021-07-23 DIAGNOSIS — M810 Age-related osteoporosis without current pathological fracture: Secondary | ICD-10-CM | POA: Diagnosis not present

## 2021-07-23 DIAGNOSIS — G4700 Insomnia, unspecified: Secondary | ICD-10-CM | POA: Diagnosis not present

## 2021-07-23 DIAGNOSIS — E1165 Type 2 diabetes mellitus with hyperglycemia: Secondary | ICD-10-CM | POA: Diagnosis not present

## 2021-07-23 DIAGNOSIS — E538 Deficiency of other specified B group vitamins: Secondary | ICD-10-CM | POA: Diagnosis not present

## 2021-08-06 DIAGNOSIS — E538 Deficiency of other specified B group vitamins: Secondary | ICD-10-CM | POA: Diagnosis not present

## 2021-08-21 DIAGNOSIS — D692 Other nonthrombocytopenic purpura: Secondary | ICD-10-CM | POA: Diagnosis not present

## 2021-08-21 DIAGNOSIS — E538 Deficiency of other specified B group vitamins: Secondary | ICD-10-CM | POA: Diagnosis not present

## 2021-08-21 DIAGNOSIS — E7800 Pure hypercholesterolemia, unspecified: Secondary | ICD-10-CM | POA: Diagnosis not present

## 2021-08-21 DIAGNOSIS — Z299 Encounter for prophylactic measures, unspecified: Secondary | ICD-10-CM | POA: Diagnosis not present

## 2021-08-21 DIAGNOSIS — F17210 Nicotine dependence, cigarettes, uncomplicated: Secondary | ICD-10-CM | POA: Diagnosis not present

## 2021-08-21 DIAGNOSIS — Z79899 Other long term (current) drug therapy: Secondary | ICD-10-CM | POA: Diagnosis not present

## 2021-08-21 DIAGNOSIS — R5383 Other fatigue: Secondary | ICD-10-CM | POA: Diagnosis not present

## 2021-08-21 DIAGNOSIS — Z2821 Immunization not carried out because of patient refusal: Secondary | ICD-10-CM | POA: Diagnosis not present

## 2021-08-21 DIAGNOSIS — Z7189 Other specified counseling: Secondary | ICD-10-CM | POA: Diagnosis not present

## 2021-08-21 DIAGNOSIS — Z0000 Encounter for general adult medical examination without abnormal findings: Secondary | ICD-10-CM | POA: Diagnosis not present

## 2021-08-22 DIAGNOSIS — M797 Fibromyalgia: Secondary | ICD-10-CM | POA: Diagnosis not present

## 2021-08-22 DIAGNOSIS — E7800 Pure hypercholesterolemia, unspecified: Secondary | ICD-10-CM | POA: Diagnosis not present

## 2021-08-26 DIAGNOSIS — Z1212 Encounter for screening for malignant neoplasm of rectum: Secondary | ICD-10-CM | POA: Diagnosis not present

## 2021-08-26 DIAGNOSIS — Z1211 Encounter for screening for malignant neoplasm of colon: Secondary | ICD-10-CM | POA: Diagnosis not present

## 2021-09-03 DIAGNOSIS — E538 Deficiency of other specified B group vitamins: Secondary | ICD-10-CM | POA: Diagnosis not present

## 2022-02-23 DIAGNOSIS — D582 Other hemoglobinopathies: Secondary | ICD-10-CM | POA: Diagnosis not present

## 2022-02-23 DIAGNOSIS — R42 Dizziness and giddiness: Secondary | ICD-10-CM | POA: Diagnosis not present

## 2022-02-23 DIAGNOSIS — Z299 Encounter for prophylactic measures, unspecified: Secondary | ICD-10-CM | POA: Diagnosis not present

## 2022-02-23 DIAGNOSIS — I70209 Unspecified atherosclerosis of native arteries of extremities, unspecified extremity: Secondary | ICD-10-CM | POA: Diagnosis not present

## 2022-02-23 DIAGNOSIS — F17210 Nicotine dependence, cigarettes, uncomplicated: Secondary | ICD-10-CM | POA: Diagnosis not present

## 2022-02-23 DIAGNOSIS — D692 Other nonthrombocytopenic purpura: Secondary | ICD-10-CM | POA: Diagnosis not present

## 2022-02-26 DIAGNOSIS — Z299 Encounter for prophylactic measures, unspecified: Secondary | ICD-10-CM | POA: Diagnosis not present

## 2022-02-26 DIAGNOSIS — E538 Deficiency of other specified B group vitamins: Secondary | ICD-10-CM | POA: Diagnosis not present

## 2022-02-26 DIAGNOSIS — F17210 Nicotine dependence, cigarettes, uncomplicated: Secondary | ICD-10-CM | POA: Diagnosis not present

## 2022-02-26 DIAGNOSIS — R5383 Other fatigue: Secondary | ICD-10-CM | POA: Diagnosis not present

## 2022-04-23 DIAGNOSIS — M545 Low back pain, unspecified: Secondary | ICD-10-CM | POA: Diagnosis not present

## 2022-04-23 DIAGNOSIS — Z299 Encounter for prophylactic measures, unspecified: Secondary | ICD-10-CM | POA: Diagnosis not present

## 2022-04-23 DIAGNOSIS — F17210 Nicotine dependence, cigarettes, uncomplicated: Secondary | ICD-10-CM | POA: Diagnosis not present

## 2022-04-25 ENCOUNTER — Encounter (HOSPITAL_COMMUNITY): Payer: Self-pay

## 2022-04-25 ENCOUNTER — Emergency Department (HOSPITAL_COMMUNITY)
Admission: EM | Admit: 2022-04-25 | Discharge: 2022-04-25 | Disposition: A | Discharge status: Home / Self Care | Payer: Medicare Other | Attending: Emergency Medicine | Admitting: Emergency Medicine

## 2022-04-25 ENCOUNTER — Other Ambulatory Visit: Payer: Self-pay

## 2022-04-25 ENCOUNTER — Emergency Department (HOSPITAL_COMMUNITY): Payer: Medicare Other

## 2022-04-25 DIAGNOSIS — D72829 Elevated white blood cell count, unspecified: Secondary | ICD-10-CM | POA: Diagnosis not present

## 2022-04-25 DIAGNOSIS — R42 Dizziness and giddiness: Secondary | ICD-10-CM | POA: Diagnosis not present

## 2022-04-25 DIAGNOSIS — I10 Essential (primary) hypertension: Secondary | ICD-10-CM | POA: Insufficient documentation

## 2022-04-25 DIAGNOSIS — R519 Headache, unspecified: Secondary | ICD-10-CM | POA: Diagnosis not present

## 2022-04-25 DIAGNOSIS — H538 Other visual disturbances: Secondary | ICD-10-CM | POA: Insufficient documentation

## 2022-04-25 LAB — CBC WITH DIFFERENTIAL/PLATELET
Abs Immature Granulocytes: 0.12 10*3/uL — ABNORMAL HIGH (ref 0.00–0.07)
Basophils Absolute: 0.1 10*3/uL (ref 0.0–0.1)
Basophils Relative: 0 %
Eosinophils Absolute: 0.1 10*3/uL (ref 0.0–0.5)
Eosinophils Relative: 0 %
HCT: 44.8 % (ref 36.0–46.0)
Hemoglobin: 15.4 g/dL — ABNORMAL HIGH (ref 12.0–15.0)
Immature Granulocytes: 1 %
Lymphocytes Relative: 22 %
Lymphs Abs: 5.3 10*3/uL — ABNORMAL HIGH (ref 0.7–4.0)
MCH: 29.4 pg (ref 26.0–34.0)
MCHC: 34.4 g/dL (ref 30.0–36.0)
MCV: 85.5 fL (ref 80.0–100.0)
Monocytes Absolute: 1.3 10*3/uL — ABNORMAL HIGH (ref 0.1–1.0)
Monocytes Relative: 5 %
Neutro Abs: 16.7 10*3/uL — ABNORMAL HIGH (ref 1.7–7.7)
Neutrophils Relative %: 72 %
Platelets: 297 10*3/uL (ref 150–400)
RBC: 5.24 MIL/uL — ABNORMAL HIGH (ref 3.87–5.11)
RDW: 13.5 % (ref 11.5–15.5)
WBC: 23.6 10*3/uL — ABNORMAL HIGH (ref 4.0–10.5)
nRBC: 0 % (ref 0.0–0.2)

## 2022-04-25 LAB — COMPREHENSIVE METABOLIC PANEL
ALT: 17 U/L (ref 0–44)
AST: 17 U/L (ref 15–41)
Albumin: 3.9 g/dL (ref 3.5–5.0)
Alkaline Phosphatase: 80 U/L (ref 38–126)
Anion gap: 6 (ref 5–15)
BUN: 12 mg/dL (ref 6–20)
CO2: 24 mmol/L (ref 22–32)
Calcium: 9 mg/dL (ref 8.9–10.3)
Chloride: 111 mmol/L (ref 98–111)
Creatinine, Ser: 0.65 mg/dL (ref 0.44–1.00)
GFR, Estimated: >60 mL/min (ref >60)
Glucose, Bld: 105 mg/dL — ABNORMAL HIGH (ref 70–99)
Potassium: 3.1 mmol/L — ABNORMAL LOW (ref 3.5–5.1)
Sodium: 141 mmol/L (ref 135–145)
Total Bilirubin: 0.4 mg/dL (ref 0.3–1.2)
Total Protein: 6.7 g/dL (ref 6.5–8.1)

## 2022-04-25 MED ORDER — METOCLOPRAMIDE HCL 5 MG/ML IJ SOLN
10.0000 mg | Freq: Once | INTRAMUSCULAR | Status: AC
  Administered 2022-04-25: 10 mg via INTRAVENOUS
  Filled 2022-04-25: qty 2

## 2022-04-25 MED ORDER — ACETAMINOPHEN 500 MG PO TABS
1000.0000 mg | ORAL_TABLET | Freq: Once | ORAL | Status: AC
  Administered 2022-04-25: 1000 mg via ORAL
  Filled 2022-04-25: qty 2

## 2022-04-25 MED ORDER — POTASSIUM CHLORIDE 20 MEQ PO PACK
60.0000 meq | PACK | Freq: Once | ORAL | Status: AC
  Administered 2022-04-25: 60 meq via ORAL
  Filled 2022-04-25: qty 3

## 2022-04-25 MED ORDER — SODIUM CHLORIDE 0.9 % IV BOLUS
1000.0000 mL | Freq: Once | INTRAVENOUS | Status: AC
  Administered 2022-04-25: 1000 mL via INTRAVENOUS

## 2022-04-25 MED ORDER — DIPHENHYDRAMINE HCL 50 MG/ML IJ SOLN
12.5000 mg | Freq: Once | INTRAMUSCULAR | Status: AC
  Administered 2022-04-25: 12.5 mg via INTRAVENOUS
  Filled 2022-04-25: qty 1

## 2022-04-25 NOTE — ED Triage Notes (Signed)
Pt reports h/a with dizziness that started about 1 hour ago.  Denies vomiting.  Resp even and unlabored.  Ambulatory to triage with steady gait.

## 2022-04-25 NOTE — ED Provider Notes (Signed)
[facility] EMERGENCY DEPARTMENT Provider Note   CSN: [id number] Arrival date & time: 04/25/22  1957     History  Chief Complaint  Patient presents with   Dizziness    Diamond Adams is a 61 y.o. female.  HPI Patient is a 61 year old female with history of anxiety, fibromyalgia, smoking, who presents to the emergency department due to lightheadedness.  States his symptoms started about 1 hour prior to arrival while folding clothes.  Reports associated frontal headache, blurry vision, nd episode of feeling flushed, as well as generalized weakness.  No chest pain, shortness of breath, syncope.  States her symptoms have improved mildly since coming to the emergency department.  Notes that she is currently being treated for back pain with prednisone but otherwise denies any recent illnesses.  No nausea, vomiting, or diarrhea.  Denies any recent heat exposure.  Denies a known history of hypertension.    Home Medications Prior to Admission medications   Medication Sig Start Date End Date Taking? Authorizing Provider  acetaminophen (TYLENOL) 500 MG tablet Take 500 mg by mouth every 6 (six) hours as needed for mild pain or moderate pain.    [provider]  conjugated estrogens (PREMARIN) vaginal cream Place 1 Applicatorful vaginally daily. 09/20/17   Florian Buff, MD  FLUoxetine (PROZAC) 10 MG capsule Take 10 mg by mouth daily.    [provider]  ibuprofen (ADVIL,MOTRIN) 600 MG tablet Take 1 tablet (600 mg total) by mouth every 6 (six) hours as needed. 12/11/18   Noemi Chapel, MD  methocarbamol (ROBAXIN) 500 MG tablet Take 1 or 2 po Q 6hrs for muscle pain 11/11/20   Rolland Porter, MD  naproxen (NAPROSYN) 500 MG tablet Take 1 tablet (500 mg total) by mouth 2 (two) times daily. 11/11/20   Rolland Porter, MD  pravastatin (PRAVACHOL) 40 MG tablet Take 40 mg by mouth daily.     [provider]     Allergies    Trazodone and nefazodone    Review of Systems   Review of  Systems  All other systems reviewed and are negative. Ten systems reviewed and are negative for acute change, except as noted in the HPI.   Physical Exam Updated Vital Signs BP (!) 200/88   Pulse 74   Temp 98.2 F (36.8 C) (Oral)   Resp 13   Ht '4\' 10"'$  (1.473 m)   Wt 66.7 kg   SpO2 97%   BMI 30.72 kg/m  Physical Exam Vitals and nursing note reviewed.  Constitutional:      General: She is not in acute distress.    Appearance: Normal appearance. She is not ill-appearing, toxic-appearing or diaphoretic.  HENT:     Head: Normocephalic and atraumatic.     Right Ear: External ear normal.     Left Ear: External ear normal.     Nose: Nose normal.     Mouth/Throat:     Mouth: Mucous membranes are moist.     Pharynx: Oropharynx is clear. No oropharyngeal exudate or posterior oropharyngeal erythema.  Eyes:     General: No scleral icterus.       Right eye: No discharge.        Left eye: No discharge.     Extraocular Movements: Extraocular movements intact.     Conjunctiva/sclera: Conjunctivae normal.     Pupils: Pupils are equal, round, and reactive to light.     Comments: PERRL.  EOMI.  Cardiovascular:     Rate and Rhythm:  Normal rate and regular rhythm.     Pulses: Normal pulses.     Heart sounds: Normal heart sounds. No murmur heard.   No friction rub. No gallop.  Pulmonary:     Effort: Pulmonary effort is normal. No respiratory distress.     Breath sounds: Normal breath sounds. No stridor. No wheezing, rhonchi or rales.  Abdominal:     General: Abdomen is flat.     Palpations: Abdomen is soft.     Tenderness: There is no abdominal tenderness.  Musculoskeletal:        General: Normal range of motion.     Cervical back: Normal range of motion and neck supple. No tenderness.  Skin:    General: Skin is warm and dry.  Neurological:     General: No focal deficit present.     Mental Status: She is alert and oriented to person, place, and time.     Comments: Patient is oriented  to person, place, and time. Patient phonates in clear, complete, and coherent sentences. Negative arm drift. Finger to nose intact bilaterally with no visible signs of dysmetria. Strength is 5/5 in all four extremities. Distal sensation intact in all four extremities.  Psychiatric:        Mood and Affect: Mood normal.        Behavior: Behavior normal.   ED Results / Procedures / Treatments   Labs (all labs ordered are listed, but only abnormal results are displayed) Labs Reviewed  COMPREHENSIVE METABOLIC PANEL - Abnormal; Notable for the following components:      Result Value   Potassium 3.1 (*)    Glucose, Bld 105 (*)    All other components within normal limits  CBC WITH DIFFERENTIAL/PLATELET - Abnormal; Notable for the following components:   WBC 23.6 (*)    RBC 5.24 (*)    Hemoglobin 15.4 (*)    Neutro Abs 16.7 (*)    Lymphs Abs 5.3 (*)    Monocytes Absolute 1.3 (*)    Abs Immature Granulocytes 0.12 (*)    All other components within normal limits   EKG EKG Interpretation  Date/Time:  Saturday April 25 2022 20:40:45 EDT Ventricular Rate:  59 PR Interval:  166 QRS Duration: 104 QT Interval:  484 QTC Calculation: 480 R Axis:   8 Text Interpretation: Sinus rhythm RSR' in V1 or V2, probably normal variant Confirmed by Noemi Chapel [phone]) on 04/25/2022 9:27:45 PM  Radiology CT Head Wo Contrast  Result Date: 04/25/2022 CLINICAL DATA:  Headache dizziness EXAM: CT HEAD WITHOUT CONTRAST TECHNIQUE: Contiguous axial images were obtained from the base of the skull through the vertex without intravenous contrast. RADIATION DOSE REDUCTION: This exam was performed according to the departmental dose-optimization program which includes automated exposure control, adjustment of the mA and/or kV according to patient size and/or use of iterative reconstruction technique. COMPARISON:  CT brain 06/25/2009 FINDINGS: Brain: No evidence of acute infarction, hemorrhage, hydrocephalus, extra-axial  collection or mass lesion/mass effect. Vascular: No hyperdense vessel or unexpected calcification. Mild carotid vascular calcification Skull: Normal. Negative for fracture or focal lesion. Sinuses/Orbits: No acute finding. Other: None IMPRESSION: Negative non contrasted CT appearance of the brain Electronically Signed   By: Donavan Foil M.D.   On: 04/25/2022 22:20    Procedures Procedures   Medications Ordered in ED Medications  potassium chloride (KLOR-CON) packet 60 mEq (has no administration in time range)  sodium chloride 0.9 % bolus 1,000 mL (1,000 mLs Intravenous New Bag/Given 04/25/22 2137)  diphenhydrAMINE (  BENADRYL) injection 12.5 mg (12.5 mg Intravenous Given 04/25/22 2137)  metoCLOPramide (REGLAN) injection 10 mg (10 mg Intravenous Given 04/25/22 2136)  acetaminophen (TYLENOL) tablet 1,000 mg (1,000 mg Oral Given 04/25/22 2137)   ED Course/ Medical Decision Making/ A&P Clinical Course as of 04/25/22 2238  Sat Apr 25, 2022  2214 CBC with Differential(!) Significant leukocytosis.  Currently taking prednisone for back pain.  Likely demargination. [LJ]    Clinical Course User Index [LJ] Rayna Sexton, PA-C                           Medical Decision Making Amount and/or Complexity of Data Reviewed Labs: ordered. Decision-making details documented in ED Course. Radiology: ordered.  Risk OTC drugs. Prescription drug management.   Pt is a 61 y.o. female who presents to the emergency department due to frontal headache, lightheadedness, blurry vision, as well as generalized weakness that started about 1 hour prior to arrival.  Labs: CBC with a white count of 23.6, hemoglobin of 15.4, RBCs of 5.24, neutrophils of 16.7, lymphocytes of 5.3, monocytes of 1.3, absolute immature granulocytes 0.12. CMP with potassium of 3.1 and a glucose of 105.  Imaging: CT scan of the head without contrast shows negative noncontrasted CT appearance of the brain.  I, Rayna Sexton, PA-C, personally  reviewed and evaluated these images and lab results as part of my medical decision-making.  On my exam heart is regular rate and rhythm without murmurs, rubs, or gallops.  Lungs are clear to auscultation bilaterally.  Abdomen is soft and nontender.  Neurological exam appears benign.  Given patient's symptoms I obtained basic lab work, CT imaging of the head.  CT scan appears reassuring.  Lab work with abnormalities as noted above.  Hypokalemic at 3.1 which was repleted with Klor-Con.  Patient with significant leukocytosis of 23.6.  After further discussion she notes that she is currently taking prednisone for back pain.  Likely demargination.  Patient afebrile and nontoxic-appearing.  Doubt infectious etiology at this time.  Patient's symptoms were treated with migraine cocktail including IV fluids, Reglan, Benadryl, as well as Tylenol.  On reassessment, symptoms and blood pressure have improved significantly.  Patient is lying comfortably in bed.  She appears stable for discharge at this time and is agreeable.  We discussed return precautions in length.  Discussed adequate hydration.  Her questions were answered and she was amicable at the time of discharge.  Note: Portions of this report may have been transcribed using voice recognition software. Every effort was made to ensure accuracy; however, inadvertent computerized transcription errors may be present.   Final Clinical Impression(s) / ED Diagnoses Final diagnoses:  Bad headache   Rx / DC Orders ED Discharge Orders     None         Rayna Sexton, PA-C 04/25/22 2240    Noemi Chapel, MD 04/27/22 1223

## 2022-04-25 NOTE — Discharge Instructions (Signed)
Like we discussed, please make sure you are staying adequately hydrated.  I recommend at least 64 ounces of water per day.  Please continue to monitor your symptoms closely and return to the emergency department with any new or worsening symptoms.

## 2022-04-25 NOTE — ED Notes (Signed)
Patient transported to CT 

## 2022-04-28 DIAGNOSIS — I10 Essential (primary) hypertension: Secondary | ICD-10-CM | POA: Diagnosis not present

## 2022-04-28 DIAGNOSIS — Z7689 Persons encountering health services in other specified circumstances: Secondary | ICD-10-CM | POA: Diagnosis not present

## 2022-04-28 DIAGNOSIS — Z789 Other specified health status: Secondary | ICD-10-CM | POA: Diagnosis not present

## 2022-04-28 DIAGNOSIS — Z299 Encounter for prophylactic measures, unspecified: Secondary | ICD-10-CM | POA: Diagnosis not present

## 2022-04-28 DIAGNOSIS — E876 Hypokalemia: Secondary | ICD-10-CM | POA: Diagnosis not present

## 2022-05-01 DIAGNOSIS — F17210 Nicotine dependence, cigarettes, uncomplicated: Secondary | ICD-10-CM | POA: Diagnosis not present

## 2022-05-01 DIAGNOSIS — E876 Hypokalemia: Secondary | ICD-10-CM | POA: Diagnosis not present

## 2022-05-01 DIAGNOSIS — Z713 Dietary counseling and surveillance: Secondary | ICD-10-CM | POA: Diagnosis not present

## 2022-05-01 DIAGNOSIS — Z299 Encounter for prophylactic measures, unspecified: Secondary | ICD-10-CM | POA: Diagnosis not present

## 2022-05-01 DIAGNOSIS — I10 Essential (primary) hypertension: Secondary | ICD-10-CM | POA: Diagnosis not present

## 2022-05-04 DIAGNOSIS — Z299 Encounter for prophylactic measures, unspecified: Secondary | ICD-10-CM | POA: Diagnosis not present

## 2022-05-04 DIAGNOSIS — I10 Essential (primary) hypertension: Secondary | ICD-10-CM | POA: Diagnosis not present

## 2022-05-06 DIAGNOSIS — H43393 Other vitreous opacities, bilateral: Secondary | ICD-10-CM | POA: Diagnosis not present

## 2022-05-06 DIAGNOSIS — H5319 Other subjective visual disturbances: Secondary | ICD-10-CM | POA: Diagnosis not present

## 2022-05-06 DIAGNOSIS — H4720 Unspecified optic atrophy: Secondary | ICD-10-CM | POA: Diagnosis not present

## 2022-05-06 DIAGNOSIS — H25813 Combined forms of age-related cataract, bilateral: Secondary | ICD-10-CM | POA: Diagnosis not present

## 2022-05-12 DIAGNOSIS — J449 Chronic obstructive pulmonary disease, unspecified: Secondary | ICD-10-CM | POA: Diagnosis not present

## 2022-05-12 DIAGNOSIS — Z299 Encounter for prophylactic measures, unspecified: Secondary | ICD-10-CM | POA: Diagnosis not present

## 2022-05-12 DIAGNOSIS — I70209 Unspecified atherosclerosis of native arteries of extremities, unspecified extremity: Secondary | ICD-10-CM | POA: Diagnosis not present

## 2022-05-12 DIAGNOSIS — I10 Essential (primary) hypertension: Secondary | ICD-10-CM | POA: Diagnosis not present

## 2022-05-12 DIAGNOSIS — R252 Cramp and spasm: Secondary | ICD-10-CM | POA: Diagnosis not present

## 2022-05-12 DIAGNOSIS — F17210 Nicotine dependence, cigarettes, uncomplicated: Secondary | ICD-10-CM | POA: Diagnosis not present

## 2022-05-17 ENCOUNTER — Inpatient Hospital Stay (HOSPITAL_COMMUNITY)
Admission: EM | Admit: 2022-05-17 | Discharge: 2022-05-21 | DRG: 064 | Disposition: A | Discharge status: Rehabilitation Facility | Payer: Medicare Other | Attending: Family Medicine | Admitting: Family Medicine

## 2022-05-17 ENCOUNTER — Encounter (HOSPITAL_COMMUNITY): Payer: Self-pay

## 2022-05-17 DIAGNOSIS — I6523 Occlusion and stenosis of bilateral carotid arteries: Secondary | ICD-10-CM | POA: Diagnosis not present

## 2022-05-17 DIAGNOSIS — E782 Mixed hyperlipidemia: Secondary | ICD-10-CM | POA: Diagnosis not present

## 2022-05-17 DIAGNOSIS — Z87891 Personal history of nicotine dependence: Secondary | ICD-10-CM | POA: Diagnosis not present

## 2022-05-17 DIAGNOSIS — I10 Essential (primary) hypertension: Secondary | ICD-10-CM | POA: Diagnosis not present

## 2022-05-17 DIAGNOSIS — I6389 Other cerebral infarction: Secondary | ICD-10-CM | POA: Diagnosis not present

## 2022-05-17 DIAGNOSIS — I63231 Cerebral infarction due to unspecified occlusion or stenosis of right carotid arteries: Secondary | ICD-10-CM | POA: Diagnosis not present

## 2022-05-17 DIAGNOSIS — H4720 Unspecified optic atrophy: Secondary | ICD-10-CM | POA: Diagnosis present

## 2022-05-17 DIAGNOSIS — G8191 Hemiplegia, unspecified affecting right dominant side: Secondary | ICD-10-CM | POA: Diagnosis not present

## 2022-05-17 DIAGNOSIS — F419 Anxiety disorder, unspecified: Secondary | ICD-10-CM | POA: Diagnosis present

## 2022-05-17 DIAGNOSIS — R531 Weakness: Secondary | ICD-10-CM

## 2022-05-17 DIAGNOSIS — E785 Hyperlipidemia, unspecified: Secondary | ICD-10-CM | POA: Diagnosis present

## 2022-05-17 DIAGNOSIS — Z8249 Family history of ischemic heart disease and other diseases of the circulatory system: Secondary | ICD-10-CM

## 2022-05-17 DIAGNOSIS — R471 Dysarthria and anarthria: Secondary | ICD-10-CM | POA: Diagnosis not present

## 2022-05-17 DIAGNOSIS — I6529 Occlusion and stenosis of unspecified carotid artery: Secondary | ICD-10-CM | POA: Diagnosis present

## 2022-05-17 DIAGNOSIS — M6281 Muscle weakness (generalized): Secondary | ICD-10-CM | POA: Diagnosis not present

## 2022-05-17 DIAGNOSIS — G8194 Hemiplegia, unspecified affecting left nondominant side: Secondary | ICD-10-CM | POA: Diagnosis present

## 2022-05-17 DIAGNOSIS — Z79899 Other long term (current) drug therapy: Secondary | ICD-10-CM

## 2022-05-17 DIAGNOSIS — I639 Cerebral infarction, unspecified: Secondary | ICD-10-CM | POA: Diagnosis not present

## 2022-05-17 DIAGNOSIS — R4020 Unspecified coma: Secondary | ICD-10-CM | POA: Diagnosis not present

## 2022-05-17 DIAGNOSIS — I7771 Dissection of carotid artery: Secondary | ICD-10-CM | POA: Diagnosis not present

## 2022-05-17 DIAGNOSIS — E876 Hypokalemia: Secondary | ICD-10-CM | POA: Diagnosis not present

## 2022-05-17 DIAGNOSIS — Z743 Need for continuous supervision: Secondary | ICD-10-CM | POA: Diagnosis not present

## 2022-05-17 DIAGNOSIS — F17210 Nicotine dependence, cigarettes, uncomplicated: Secondary | ICD-10-CM | POA: Diagnosis not present

## 2022-05-17 DIAGNOSIS — T148XXA Other injury of unspecified body region, initial encounter: Secondary | ICD-10-CM

## 2022-05-17 DIAGNOSIS — D72829 Elevated white blood cell count, unspecified: Secondary | ICD-10-CM | POA: Diagnosis not present

## 2022-05-17 DIAGNOSIS — Z888 Allergy status to other drugs, medicaments and biological substances status: Secondary | ICD-10-CM

## 2022-05-17 DIAGNOSIS — Z043 Encounter for examination and observation following other accident: Secondary | ICD-10-CM | POA: Diagnosis not present

## 2022-05-17 DIAGNOSIS — F32A Depression, unspecified: Secondary | ICD-10-CM | POA: Diagnosis not present

## 2022-05-17 DIAGNOSIS — R5383 Other fatigue: Secondary | ICD-10-CM

## 2022-05-17 DIAGNOSIS — M797 Fibromyalgia: Secondary | ICD-10-CM | POA: Diagnosis not present

## 2022-05-17 DIAGNOSIS — F17200 Nicotine dependence, unspecified, uncomplicated: Secondary | ICD-10-CM

## 2022-05-17 DIAGNOSIS — I160 Hypertensive urgency: Secondary | ICD-10-CM | POA: Diagnosis not present

## 2022-05-17 DIAGNOSIS — I63511 Cerebral infarction due to unspecified occlusion or stenosis of right middle cerebral artery: Secondary | ICD-10-CM | POA: Diagnosis not present

## 2022-05-17 DIAGNOSIS — Z28310 Unvaccinated for covid-19: Secondary | ICD-10-CM

## 2022-05-17 DIAGNOSIS — I6521 Occlusion and stenosis of right carotid artery: Secondary | ICD-10-CM | POA: Diagnosis not present

## 2022-05-17 DIAGNOSIS — R29700 NIHSS score 0: Secondary | ICD-10-CM | POA: Diagnosis present

## 2022-05-17 DIAGNOSIS — F411 Generalized anxiety disorder: Secondary | ICD-10-CM | POA: Diagnosis not present

## 2022-05-17 DIAGNOSIS — R29818 Other symptoms and signs involving the nervous system: Secondary | ICD-10-CM | POA: Diagnosis not present

## 2022-05-17 DIAGNOSIS — Z79818 Long term (current) use of other agents affecting estrogen receptors and estrogen levels: Secondary | ICD-10-CM

## 2022-05-17 DIAGNOSIS — R42 Dizziness and giddiness: Secondary | ICD-10-CM | POA: Diagnosis not present

## 2022-05-17 HISTORY — DX: Cerebral infarction, unspecified: I63.9

## 2022-05-17 HISTORY — DX: Essential (primary) hypertension: I10

## 2022-05-17 NOTE — ED Triage Notes (Signed)
Pt arrived from home by [facility] with complaints of a fall that happened after patient had dizziness and numbness in all 4 extremities. Says she also has blurred vision. Pt has had weakness for the past 2 weeks.

## 2022-05-18 ENCOUNTER — Other Ambulatory Visit (HOSPITAL_COMMUNITY): Payer: Self-pay | Admitting: *Deleted

## 2022-05-18 ENCOUNTER — Other Ambulatory Visit (HOSPITAL_COMMUNITY): Payer: Medicare Other

## 2022-05-18 ENCOUNTER — Emergency Department (HOSPITAL_COMMUNITY): Payer: Medicare Other

## 2022-05-18 ENCOUNTER — Inpatient Hospital Stay (HOSPITAL_COMMUNITY): Payer: Medicare Other

## 2022-05-18 ENCOUNTER — Encounter (HOSPITAL_COMMUNITY): Payer: Self-pay | Admitting: Family Medicine

## 2022-05-18 ENCOUNTER — Other Ambulatory Visit: Payer: Self-pay

## 2022-05-18 DIAGNOSIS — F411 Generalized anxiety disorder: Secondary | ICD-10-CM | POA: Diagnosis not present

## 2022-05-18 DIAGNOSIS — R5381 Other malaise: Secondary | ICD-10-CM | POA: Diagnosis not present

## 2022-05-18 DIAGNOSIS — F17200 Nicotine dependence, unspecified, uncomplicated: Secondary | ICD-10-CM

## 2022-05-18 DIAGNOSIS — F17210 Nicotine dependence, cigarettes, uncomplicated: Secondary | ICD-10-CM | POA: Diagnosis not present

## 2022-05-18 DIAGNOSIS — I63511 Cerebral infarction due to unspecified occlusion or stenosis of right middle cerebral artery: Secondary | ICD-10-CM | POA: Diagnosis not present

## 2022-05-18 DIAGNOSIS — Z79899 Other long term (current) drug therapy: Secondary | ICD-10-CM | POA: Diagnosis not present

## 2022-05-18 DIAGNOSIS — R531 Weakness: Secondary | ICD-10-CM

## 2022-05-18 DIAGNOSIS — I7771 Dissection of carotid artery: Secondary | ICD-10-CM | POA: Diagnosis not present

## 2022-05-18 DIAGNOSIS — Z043 Encounter for examination and observation following other accident: Secondary | ICD-10-CM | POA: Diagnosis not present

## 2022-05-18 DIAGNOSIS — R42 Dizziness and giddiness: Secondary | ICD-10-CM | POA: Diagnosis not present

## 2022-05-18 DIAGNOSIS — R1031 Right lower quadrant pain: Secondary | ICD-10-CM | POA: Diagnosis not present

## 2022-05-18 DIAGNOSIS — L7632 Postprocedural hematoma of skin and subcutaneous tissue following other procedure: Secondary | ICD-10-CM | POA: Diagnosis not present

## 2022-05-18 DIAGNOSIS — Z823 Family history of stroke: Secondary | ICD-10-CM | POA: Diagnosis not present

## 2022-05-18 DIAGNOSIS — Z28310 Unvaccinated for covid-19: Secondary | ICD-10-CM | POA: Diagnosis not present

## 2022-05-18 DIAGNOSIS — M797 Fibromyalgia: Secondary | ICD-10-CM | POA: Diagnosis not present

## 2022-05-18 DIAGNOSIS — E785 Hyperlipidemia, unspecified: Secondary | ICD-10-CM

## 2022-05-18 DIAGNOSIS — R29818 Other symptoms and signs involving the nervous system: Secondary | ICD-10-CM | POA: Diagnosis not present

## 2022-05-18 DIAGNOSIS — G8194 Hemiplegia, unspecified affecting left nondominant side: Secondary | ICD-10-CM | POA: Diagnosis not present

## 2022-05-18 DIAGNOSIS — D72829 Elevated white blood cell count, unspecified: Secondary | ICD-10-CM | POA: Diagnosis not present

## 2022-05-18 DIAGNOSIS — I10 Essential (primary) hypertension: Secondary | ICD-10-CM | POA: Diagnosis not present

## 2022-05-18 DIAGNOSIS — I160 Hypertensive urgency: Secondary | ICD-10-CM | POA: Diagnosis not present

## 2022-05-18 DIAGNOSIS — I69354 Hemiplegia and hemiparesis following cerebral infarction affecting left non-dominant side: Secondary | ICD-10-CM | POA: Diagnosis not present

## 2022-05-18 DIAGNOSIS — E876 Hypokalemia: Secondary | ICD-10-CM

## 2022-05-18 DIAGNOSIS — R200 Anesthesia of skin: Secondary | ICD-10-CM | POA: Diagnosis not present

## 2022-05-18 DIAGNOSIS — E782 Mixed hyperlipidemia: Secondary | ICD-10-CM

## 2022-05-18 DIAGNOSIS — I6389 Other cerebral infarction: Secondary | ICD-10-CM

## 2022-05-18 DIAGNOSIS — H4720 Unspecified optic atrophy: Secondary | ICD-10-CM | POA: Diagnosis not present

## 2022-05-18 DIAGNOSIS — R5383 Other fatigue: Secondary | ICD-10-CM

## 2022-05-18 DIAGNOSIS — K219 Gastro-esophageal reflux disease without esophagitis: Secondary | ICD-10-CM | POA: Diagnosis not present

## 2022-05-18 DIAGNOSIS — F32A Depression, unspecified: Secondary | ICD-10-CM | POA: Diagnosis not present

## 2022-05-18 DIAGNOSIS — R29700 NIHSS score 0: Secondary | ICD-10-CM | POA: Diagnosis not present

## 2022-05-18 DIAGNOSIS — M6281 Muscle weakness (generalized): Secondary | ICD-10-CM | POA: Diagnosis not present

## 2022-05-18 DIAGNOSIS — Z1321 Encounter for screening for nutritional disorder: Secondary | ICD-10-CM | POA: Diagnosis not present

## 2022-05-18 DIAGNOSIS — I63231 Cerebral infarction due to unspecified occlusion or stenosis of right carotid arteries: Secondary | ICD-10-CM | POA: Diagnosis not present

## 2022-05-18 DIAGNOSIS — I69398 Other sequelae of cerebral infarction: Secondary | ICD-10-CM | POA: Diagnosis not present

## 2022-05-18 DIAGNOSIS — D649 Anemia, unspecified: Secondary | ICD-10-CM | POA: Diagnosis not present

## 2022-05-18 DIAGNOSIS — Z888 Allergy status to other drugs, medicaments and biological substances status: Secondary | ICD-10-CM | POA: Diagnosis not present

## 2022-05-18 DIAGNOSIS — I639 Cerebral infarction, unspecified: Secondary | ICD-10-CM

## 2022-05-18 DIAGNOSIS — I6521 Occlusion and stenosis of right carotid artery: Secondary | ICD-10-CM | POA: Diagnosis not present

## 2022-05-18 DIAGNOSIS — I6523 Occlusion and stenosis of bilateral carotid arteries: Secondary | ICD-10-CM | POA: Diagnosis not present

## 2022-05-18 DIAGNOSIS — R471 Dysarthria and anarthria: Secondary | ICD-10-CM | POA: Diagnosis not present

## 2022-05-18 DIAGNOSIS — Z8249 Family history of ischemic heart disease and other diseases of the circulatory system: Secondary | ICD-10-CM | POA: Diagnosis not present

## 2022-05-18 DIAGNOSIS — G8191 Hemiplegia, unspecified affecting right dominant side: Secondary | ICD-10-CM | POA: Diagnosis not present

## 2022-05-18 DIAGNOSIS — Z825 Family history of asthma and other chronic lower respiratory diseases: Secondary | ICD-10-CM | POA: Diagnosis not present

## 2022-05-18 DIAGNOSIS — I6529 Occlusion and stenosis of unspecified carotid artery: Secondary | ICD-10-CM | POA: Diagnosis present

## 2022-05-18 DIAGNOSIS — Z79818 Long term (current) use of other agents affecting estrogen receptors and estrogen levels: Secondary | ICD-10-CM | POA: Diagnosis not present

## 2022-05-18 DIAGNOSIS — F419 Anxiety disorder, unspecified: Secondary | ICD-10-CM | POA: Diagnosis present

## 2022-05-18 DIAGNOSIS — Z9181 History of falling: Secondary | ICD-10-CM | POA: Diagnosis not present

## 2022-05-18 LAB — CBC
HCT: 42 % (ref 36.0–46.0)
Hemoglobin: 14.2 g/dL (ref 12.0–15.0)
MCH: 28.9 pg (ref 26.0–34.0)
MCHC: 33.8 g/dL (ref 30.0–36.0)
MCV: 85.5 fL (ref 80.0–100.0)
Platelets: 273 10*3/uL (ref 150–400)
RBC: 4.91 MIL/uL (ref 3.87–5.11)
RDW: 13.1 % (ref 11.5–15.5)
WBC: 17.6 10*3/uL — ABNORMAL HIGH (ref 4.0–10.5)
nRBC: 0 % (ref 0.0–0.2)

## 2022-05-18 LAB — RAPID URINE DRUG SCREEN, HOSP PERFORMED
Amphetamines: NOT DETECTED
Barbiturates: NOT DETECTED
Benzodiazepines: NOT DETECTED
Cocaine: NOT DETECTED
Opiates: NOT DETECTED
Tetrahydrocannabinol: NOT DETECTED

## 2022-05-18 LAB — CBC WITH DIFFERENTIAL/PLATELET
Abs Immature Granulocytes: 0.33 10*3/uL — ABNORMAL HIGH (ref 0.00–0.07)
Basophils Absolute: 0.1 10*3/uL (ref 0.0–0.1)
Basophils Relative: 1 %
Eosinophils Absolute: 0.1 10*3/uL (ref 0.0–0.5)
Eosinophils Relative: 1 %
HCT: 44.5 % (ref 36.0–46.0)
Hemoglobin: 15.6 g/dL — ABNORMAL HIGH (ref 12.0–15.0)
Immature Granulocytes: 2 %
Lymphocytes Relative: 18 %
Lymphs Abs: 3.2 10*3/uL (ref 0.7–4.0)
MCH: 29.5 pg (ref 26.0–34.0)
MCHC: 35.1 g/dL (ref 30.0–36.0)
MCV: 84.3 fL (ref 80.0–100.0)
Monocytes Absolute: 1.1 10*3/uL — ABNORMAL HIGH (ref 0.1–1.0)
Monocytes Relative: 6 %
Neutro Abs: 13.1 10*3/uL — ABNORMAL HIGH (ref 1.7–7.7)
Neutrophils Relative %: 72 %
Platelets: 264 10*3/uL (ref 150–400)
RBC: 5.28 MIL/uL — ABNORMAL HIGH (ref 3.87–5.11)
RDW: 12.9 % (ref 11.5–15.5)
WBC: 17.9 10*3/uL — ABNORMAL HIGH (ref 4.0–10.5)
nRBC: 0 % (ref 0.0–0.2)

## 2022-05-18 LAB — URINALYSIS, ROUTINE W REFLEX MICROSCOPIC
Bilirubin Urine: NEGATIVE
Glucose, UA: NEGATIVE mg/dL
Hgb urine dipstick: NEGATIVE
Ketones, ur: NEGATIVE mg/dL
Leukocytes,Ua: NEGATIVE
Nitrite: NEGATIVE
Protein, ur: NEGATIVE mg/dL
Specific Gravity, Urine: 1.005 (ref 1.005–1.030)
pH: 7 (ref 5.0–8.0)

## 2022-05-18 LAB — LIPID PANEL
Cholesterol: 138 mg/dL (ref 0–200)
HDL: 40 mg/dL — ABNORMAL LOW (ref >40)
LDL Cholesterol: 83 mg/dL (ref 0–99)
Total CHOL/HDL Ratio: 3.5 RATIO
Triglycerides: 75 mg/dL (ref <150)
VLDL: 15 mg/dL (ref 0–40)

## 2022-05-18 LAB — COMPREHENSIVE METABOLIC PANEL
ALT: 16 U/L (ref 0–44)
ALT: 19 U/L (ref 0–44)
AST: 12 U/L — ABNORMAL LOW (ref 15–41)
AST: 16 U/L (ref 15–41)
Albumin: 3.3 g/dL — ABNORMAL LOW (ref 3.5–5.0)
Albumin: 3.9 g/dL (ref 3.5–5.0)
Alkaline Phosphatase: 75 U/L (ref 38–126)
Alkaline Phosphatase: 89 U/L (ref 38–126)
Anion gap: 8 (ref 5–15)
Anion gap: 9 (ref 5–15)
BUN: 10 mg/dL (ref 6–20)
BUN: 12 mg/dL (ref 6–20)
CO2: 24 mmol/L (ref 22–32)
CO2: 24 mmol/L (ref 22–32)
Calcium: 8.6 mg/dL — ABNORMAL LOW (ref 8.9–10.3)
Calcium: 9 mg/dL (ref 8.9–10.3)
Chloride: 101 mmol/L (ref 98–111)
Chloride: 104 mmol/L (ref 98–111)
Creatinine, Ser: 0.56 mg/dL (ref 0.44–1.00)
Creatinine, Ser: 0.57 mg/dL (ref 0.44–1.00)
GFR, Estimated: >60 mL/min (ref >60)
GFR, Estimated: >60 mL/min (ref >60)
Glucose, Bld: 107 mg/dL — ABNORMAL HIGH (ref 70–99)
Glucose, Bld: 96 mg/dL (ref 70–99)
Potassium: 3.1 mmol/L — ABNORMAL LOW (ref 3.5–5.1)
Potassium: 3.4 mmol/L — ABNORMAL LOW (ref 3.5–5.1)
Sodium: 134 mmol/L — ABNORMAL LOW (ref 135–145)
Sodium: 136 mmol/L (ref 135–145)
Total Bilirubin: 0.4 mg/dL (ref 0.3–1.2)
Total Bilirubin: 0.6 mg/dL (ref 0.3–1.2)
Total Protein: 6.2 g/dL — ABNORMAL LOW (ref 6.5–8.1)
Total Protein: 7.4 g/dL (ref 6.5–8.1)

## 2022-05-18 LAB — TROPONIN I (HIGH SENSITIVITY)
Troponin I (High Sensitivity): 3 ng/L (ref <18)
Troponin I (High Sensitivity): 3 ng/L (ref <18)

## 2022-05-18 LAB — HIV ANTIBODY (ROUTINE TESTING W REFLEX): HIV Screen 4th Generation wRfx: NONREACTIVE

## 2022-05-18 LAB — VITAMIN B12: Vitamin B-12: 475 pg/mL (ref 180–914)

## 2022-05-18 LAB — ETHANOL: Alcohol, Ethyl (B): <10 mg/dL (ref <10)

## 2022-05-18 LAB — SEDIMENTATION RATE: Sed Rate: 32 mm/hr — ABNORMAL HIGH (ref 0–22)

## 2022-05-18 LAB — HEMOGLOBIN A1C
Hgb A1c MFr Bld: 5.4 % (ref 4.8–5.6)
Mean Plasma Glucose: 108.28 mg/dL

## 2022-05-18 LAB — C-REACTIVE PROTEIN: CRP: 2.6 mg/dL — ABNORMAL HIGH (ref <1.0)

## 2022-05-18 LAB — MAGNESIUM: Magnesium: 2 mg/dL (ref 1.7–2.4)

## 2022-05-18 LAB — TSH: TSH: 1.362 u[IU]/mL (ref 0.350–4.500)

## 2022-05-18 LAB — LACTIC ACID, PLASMA: Lactic Acid, Venous: 1.4 mmol/L (ref 0.5–1.9)

## 2022-05-18 LAB — FOLATE: Folate: 7.2 ng/mL (ref >5.9)

## 2022-05-18 MED ORDER — ONDANSETRON HCL 4 MG/2ML IJ SOLN
4.0000 mg | Freq: Four times a day (QID) | INTRAMUSCULAR | Status: DC | PRN
  Administered 2022-05-19: 4 mg via INTRAVENOUS
  Filled 2022-05-18: qty 2

## 2022-05-18 MED ORDER — ACETAMINOPHEN 500 MG PO TABS: 500.0000 mg | ORAL_TABLET | Freq: Four times a day (QID) | ORAL | Status: DC | PRN

## 2022-05-18 MED ORDER — ASPIRIN 81 MG PO CHEW
324.0000 mg | CHEWABLE_TABLET | Freq: Once | ORAL | Status: AC
  Administered 2022-05-18: 324 mg via ORAL
  Filled 2022-05-18: qty 4

## 2022-05-18 MED ORDER — POTASSIUM CHLORIDE 20 MEQ PO PACK
40.0000 meq | PACK | Freq: Once | ORAL | Status: AC
  Administered 2022-05-18: 40 meq via ORAL
  Filled 2022-05-18: qty 2

## 2022-05-18 MED ORDER — ACETAMINOPHEN 160 MG/5ML PO SOLN: 650.0000 mg | ORAL | Status: DC | PRN

## 2022-05-18 MED ORDER — FLUOXETINE HCL 10 MG PO CAPS
10.0000 mg | ORAL_CAPSULE | Freq: Every day | ORAL | Status: DC
  Administered 2022-05-18 – 2022-05-21 (×3): 10 mg via ORAL
  Filled 2022-05-18 (×4): qty 1

## 2022-05-18 MED ORDER — STROKE: EARLY STAGES OF RECOVERY BOOK
Freq: Once | Status: AC
  Filled 2022-05-18 (×2): qty 1

## 2022-05-18 MED ORDER — LACTATED RINGERS IV BOLUS
1000.0000 mL | Freq: Once | INTRAVENOUS | Status: AC
  Administered 2022-05-18: 1000 mL via INTRAVENOUS

## 2022-05-18 MED ORDER — ASPIRIN 81 MG PO TBEC
81.0000 mg | DELAYED_RELEASE_TABLET | Freq: Every day | ORAL | Status: DC
  Administered 2022-05-18: 81 mg via ORAL
  Filled 2022-05-18: qty 1

## 2022-05-18 MED ORDER — PRAVASTATIN SODIUM 40 MG PO TABS
40.0000 mg | ORAL_TABLET | Freq: Every day | ORAL | Status: DC
  Administered 2022-05-18: 40 mg via ORAL
  Filled 2022-05-18: qty 1

## 2022-05-18 MED ORDER — HEPARIN SODIUM (PORCINE) 5000 UNIT/ML IJ SOLN
5000.0000 [IU] | Freq: Three times a day (TID) | INTRAMUSCULAR | Status: DC
  Administered 2022-05-18 – 2022-05-21 (×9): 5000 [IU] via SUBCUTANEOUS
  Filled 2022-05-18 (×10): qty 1

## 2022-05-18 MED ORDER — ACETAMINOPHEN 650 MG RE SUPP: 650.0000 mg | RECTAL | Status: DC | PRN

## 2022-05-18 MED ORDER — SENNOSIDES-DOCUSATE SODIUM 8.6-50 MG PO TABS: 1.0000 | ORAL_TABLET | Freq: Every evening | ORAL | Status: DC | PRN

## 2022-05-18 MED ORDER — HYDRALAZINE HCL 20 MG/ML IJ SOLN: 10.0000 mg | Freq: Four times a day (QID) | INTRAMUSCULAR | Status: DC | PRN

## 2022-05-18 MED ORDER — ACETAMINOPHEN 325 MG PO TABS
650.0000 mg | ORAL_TABLET | ORAL | Status: DC | PRN
  Administered 2022-05-20 (×2): 650 mg via ORAL
  Filled 2022-05-18: qty 2

## 2022-05-18 MED ORDER — CLOPIDOGREL BISULFATE 75 MG PO TABS
75.0000 mg | ORAL_TABLET | Freq: Every day | ORAL | Status: DC
  Administered 2022-05-19 – 2022-05-21 (×3): 75 mg via ORAL
  Filled 2022-05-18 (×3): qty 1

## 2022-05-18 MED ORDER — ASPIRIN 81 MG PO CHEW: 81.0000 mg | CHEWABLE_TABLET | Freq: Once | ORAL | Status: DC

## 2022-05-18 MED ORDER — IOHEXOL 350 MG/ML SOLN
75.0000 mL | Freq: Once | INTRAVENOUS | Status: AC | PRN
  Administered 2022-05-18: 75 mL via INTRAVENOUS

## 2022-05-18 MED ORDER — GADOBUTROL 1 MMOL/ML IV SOLN
7.0000 mL | Freq: Once | INTRAVENOUS | Status: AC | PRN
  Administered 2022-05-18: 7 mL via INTRAVENOUS

## 2022-05-18 MED ORDER — CLOPIDOGREL BISULFATE 75 MG PO TABS
300.0000 mg | ORAL_TABLET | Freq: Every day | ORAL | Status: AC
  Administered 2022-05-18: 300 mg via ORAL
  Filled 2022-05-18: qty 4

## 2022-05-18 MED ORDER — CLOPIDOGREL BISULFATE 75 MG PO TABS: 300.0000 mg | ORAL_TABLET | Freq: Every day | ORAL | Status: DC

## 2022-05-18 NOTE — Consult Note (Signed)
NEURO HOSPITALIST CONSULT NOTE   Requestig physician: Dr. Lucianne Muss  Reason for Consult: CT head positive for subacute-appearing right parietal lobe stroke  History obtained from:  Patient and Chart     HPI:                                                                                                                                          Diamond Adams is an 61 y.o. female with a PMHx of smoking, anxiety disorder, depression, fibromyalgia, herpes and HTN who presented to the [facility] on Sunday night with a chief complaint of generalized weakness x 2 weeks, in addition to sudden onset of left-sided weakness on Sunday. She initially reported to EDP that she was also having intermittent episodes of bilateral upper and lower extremity weakness with paresthesias. In addition to the acute onset of left sided weakness and dizziness, which resulted in a fall, she also complained of bilateral blurred vision diffusely, without describing any field cut. Also had experienced some dysarthria after her fall which had resolved. She endorsed having had kaleidoscope vision in both eyes transiently. By the time she was evaluated in the [facility] ED, she had returned to baseline. CT head revealed a wedge-shaped area in the right posterior parietal lobe consistent with an evolving infarct, appearing acute to subacute in nature.     She was evaluated by Teleneurology at [facility]. NIHSS on their exam was 0. WBC was 17.9 at the [facility] ED.   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 Reconstitution of the right ICA was noted at the distal petrous segment, likely due to collateral flow across the circle-of-Willis. No intracranial arterial occlusion or hemodynamically significant stenosis was seen.  Vascular surgery was consulted and reports that they almost  never do surgery on this, but to start aspirin and they will see patient when she arrives.  Neurology was consulted and advised to load with Plavix.   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   CTA shows carotid dissection.    Past Medical History:  Diagnosis Date   Anxiety disorder    Depression    Fibromyalgia    Herpes    Hypertension    PONV (postoperative nausea and vomiting)     Past Surgical History:  Procedure Laterality Date   ABDOMINAL HYSTERECTOMY  1994   CARPAL TUNNEL RELEASE  2013,2011   right and left   EXCISION NASAL MASS Left 09/25/2013   Procedure: EXCISION NASAL CANCER WITH SPLIT THICKNESS SKIN GRAFT FROM RIGHT THIGH;  Surgeon: Darletta Moll, MD;  Location: [facility]  [place];  Service: ENT;  Laterality: Left;   RIGHT OOPHORECTOMY     THUMB ARTHROSCOPY     right   TONSILLECTOMY     TUBAL LIGATION      Family History  Problem Relation Age of Onset   Heart disease Father    Emphysema Mother    Cancer Sister        cervical             Social History:  reports that she has been smoking cigarettes. She has been smoking an average of .5 packs per day. She has never used smokeless tobacco. She reports that she does not drink alcohol and does not use drugs.  Allergies  Allergen Reactions   Trazodone And Nefazodone Hives    MEDICATIONS:                                                                                                                     Prior to Admission:  Medications Prior to Admission  Medication Sig Dispense Refill Last Dose   acetaminophen (TYLENOL) 500 MG tablet Take 500 mg by mouth every 6 (six) hours as needed for mild pain or moderate pain.   UNK   lisinopril-hydrochlorothiazide (ZESTORETIC) 20-25 MG tablet Take 1 tablet by mouth in the morning and at bedtime.   05/17/2022   meclizine (ANTIVERT) 25 MG tablet Take 25 mg by mouth 2 (two) times daily  as needed for dizziness.   UNK   metoprolol succinate (TOPROL-XL) 25 MG 24 hr tablet Take 12.5 mg by mouth daily.   05/17/2022 at 1700   pantoprazole (PROTONIX) 40 MG tablet Take 40 mg by mouth daily as needed (reflux).   Past Month   pravastatin (PRAVACHOL) 40 MG tablet Take 40 mg by mouth daily.    05/16/2022   Scheduled:   stroke: early stages of recovery book   Does not apply Once   aspirin EC  81 mg Oral Daily   clopidogrel  75 mg Oral Daily   FLUoxetine  10 mg Oral Daily   heparin  5,000 Units Subcutaneous Q8H   pravastatin  40 mg Oral Daily    ROS:                                                                                                                                       As per HPI.    Blood  pressure (!) 141/53, pulse 68, temperature 97.9 F (36.6 C), temperature source Oral, resp. rate 18, height 4\' 10"  (1.473 m), weight 59.9 kg, SpO2 98 %.   General Examination:                                                                                                       Physical Exam  HEENT-  [place]/AT  Lungs- Respirations unlabored Extremities- No edema  Neurological Examination Mental Status: Awake and alert. Oriented x 5. Speech fluent with intact naming and comprehension.  Cranial Nerves: II: Visual fields intact bilaterally. No extinction to DSS.   III,IV, VI: No ptosis. EOMI. No nystagmus.  V: Temp sensation equal bilaterally VII: Smile symmetric VIII: Hearing intact to conversation IX,X: No hoarseness or hypophonia XI: Symmetric XII: Midline tongue extension Motor: Right : Upper extremity   5/5    Left:     Upper extremity   5/5  Lower extremity   5/5     Lower extremity   5/5 No pronator drift Sensory: Temp and light touch intact throughout, bilaterally. No extinction to DSS.  Deep Tendon Reflexes: 2+ and symmetric throughout Cerebellar: No ataxia with FNF bilaterally Gait: Deferred    Lab Results: Basic Metabolic Panel: Recent Labs  Lab 05/18/22 0011  05/18/22 0410  NA 134* 136  K 3.1* 3.4*  CL 101 104  CO2 24 24  GLUCOSE 107* 96  BUN 12 10  CREATININE 0.57 0.56  CALCIUM 9.0 8.6*  MG 2.0  --     CBC: Recent Labs  Lab 05/18/22 0011 05/18/22 0410  WBC 17.9* 17.6*  NEUTROABS 13.1*  --   HGB 15.6* 14.2  HCT 44.5 42.0  MCV 84.3 85.5  PLT 264 273    Cardiac Enzymes: No results for input(s): "CKTOTAL", "CKMB", "CKMBINDEX", "TROPONINI" in the last 168 hours.  Lipid Panel: Recent Labs  Lab 05/18/22 0410  CHOL 138  TRIG 75  HDL 40*  CHOLHDL 3.5  VLDL 15  LDLCALC 83    Imaging: VAS US CAROTID  Result Date: 05/18/2022 Carotid Arterial Duplex Study Patient Name:  Diamond Adams  Date of Exam:   05/18/2022 Medical Rec #: [id number]      Accession #:    [id number] Date of Birth: Mar 25, 1961      Patient Gender: F Patient Age:   61 years Exam Location:  [facility] Procedure:      VAS US CAROTID Referring Phys: Sherald Hess --------------------------------------------------------------------------------  Indications:      Question right ICA dissection and possible occlusion seen on                   CTA. Risk Factors:     Hypertension. Comparison Study: No prior study Performing Technologist: Gertie Fey MHA, RDMS, RVT, RDCS  Examination Guidelines: A complete evaluation includes B-mode imaging, spectral Doppler, color Doppler, and power Doppler as needed of all accessible portions of each vessel. Bilateral testing is considered an integral part of a complete examination. Limited examinations for reoccurring indications may be performed as noted.  Right Carotid  Findings: +----------+-------+--------+--------+------------------------+----------------+           PSV    EDV cm/sStenosisPlaque Description      Comments                   cm/s                                                            +----------+-------+--------+--------+------------------------+----------------+ CCA Prox  47     4                                                         +----------+-------+--------+--------+------------------------+----------------+ CCA Distal40     7                                                        +----------+-------+--------+--------+------------------------+----------------+ ICA Prox  360    94      80-99%  heterogenous and                                                          irregular                                +----------+-------+--------+--------+------------------------+----------------+ ICA Mid   77                                             thumped waveform +----------+-------+--------+--------+------------------------+----------------+ ICA Distal49     8                                                        +----------+-------+--------+--------+------------------------+----------------+ ECA       144    23                                                       +----------+-------+--------+--------+------------------------+----------------+ +----------+--------+-------+----------------+-------------------+           PSV cm/sEDV cmsDescribe        Arm Pressure (mmHG) +----------+--------+-------+----------------+-------------------+ [id number]            Multiphasic, WNL                    +----------+--------+-------+----------------+-------------------+ +---------+--------+--+--------+--+---------+ VertebralPSV cm/s84EDV cm/s17Antegrade +---------+--------+--+--------+--+---------+  Left Carotid Findings: +----------+--------+--------+--------+-----------------------+--------+  PSV cm/sEDV cm/sStenosisPlaque Description     Comments +----------+--------+--------+--------+-----------------------+--------+ CCA Prox  103     33                                              +----------+--------+--------+--------+-----------------------+--------+ CCA Distal109     29                                               +----------+--------+--------+--------+-----------------------+--------+ ICA Prox  274     75      60-79%  smooth and heterogenous         +----------+--------+--------+--------+-----------------------+--------+ ICA Mid   165     24                                              +----------+--------+--------+--------+-----------------------+--------+ ICA Distal154     42                                              +----------+--------+--------+--------+-----------------------+--------+ ECA       129     12                                              +----------+--------+--------+--------+-----------------------+--------+ +----------+--------+--------+----------------+-------------------+           PSV cm/sEDV cm/sDescribe        Arm Pressure (mmHG) +----------+--------+--------+----------------+-------------------+ Subclavian171             Multiphasic, WNL                    +----------+--------+--------+----------------+-------------------+ +---------+--------+--+--------+--+---------+ VertebralPSV cm/s72EDV cm/s20Antegrade +---------+--------+--+--------+--+---------+   Summary: Right Carotid: Velocities in the right ICA are consistent with a 80-99% stenosis                by peak systolic velocities and plaque morphology. No obvious                evidence of dissection. Left Carotid: Velocities in the left ICA are consistent with a 60-79% stenosis,               however this may be compensatory flow. Vertebrals:  Bilateral vertebral arteries demonstrate antegrade flow. Subclavians: Normal flow hemodynamics were seen in bilateral subclavian              arteries. *See table(s) above for measurements and observations.     Preliminary    EEG adult  Result Date: 05/18/2022 Charlsie Quest, MD     05/18/2022  8:21 PM Patient Name: Diamond Adams MRN: [id number] Epilepsy Attending: Charlsie Quest Referring Physician/Provider: Lilyan Gilford, DO Date:  05/18/2022 Duration: 25.48 mins Patient history: 54 YOF presetned with compalint of intermittent numbness/tinlging on left side, weakness all over, left facial symptoms, fall/dizziness symptoms. EEG to evaluate for seizure. Level of alertness: Awake AEDs during EEG study: None Technical aspects: This EEG study was  done with scalp electrodes positioned according to the 10-20 International system of electrode placement. Electrical activity was acquired at a sampling rate of 500Hz  and reviewed with a high frequency filter of 70Hz  and a low frequency filter of 1Hz . EEG data were recorded continuously and digitally stored. Description: The posterior dominant rhythm consists of 9 Hz activity of moderate voltage (25-35 uV) seen predominantly in posterior head regions, symmetric and reactive to eye opening and eye closing. EEG showed intermittent 3 to 6 Hz theta-delta slowing in right temporal region. Physiologic photic driving was not seen during photic stimulation.  Hyperventilation was not performed.   ABNORMALITY - Intermittent slow, right temporal region. IMPRESSION: This study is suggestive of cortical dysfunction arising from right temporal region, likely secondary to underlying structural abnormality. No seizures or epileptiform discharges were seen throughout the recording. Charlsie Quest   MR BRAIN W WO CONTRAST  Result Date: 05/18/2022 CLINICAL DATA:  Dizziness with numbness in all 4 extremities.  Fall. EXAM: MRI HEAD WITHOUT AND WITH CONTRAST TECHNIQUE: Multiplanar, multiecho pulse sequences of the brain and surrounding structures were obtained without and with intravenous contrast. CONTRAST:  7mL GADAVIST GADOBUTROL 1 MMOL/ML IV SOLN COMPARISON:  Head CT and CTA from earlier today FINDINGS: Brain: Patchy acute infarcts in the right cerebral hemisphere, most confluent in the parietal cortex and subcortical white matter but small infarcts also seen roughly aligned along the right MCA/ACA watershed, in the  right periatrial white matter, and near the anterior commissure. Mild mineralization at the parietal cortex infarct which is also enhancing. Some of the watershed type infarcts in the right corona radiata are enhancing as well. No hydrocephalus or masslike finding. No prior gray matter infarcts. Vascular: Absent right ICA flow void in the neck and skull base, known from prior CTA. Skull and upper cervical spine: Normal marrow signal. Sinuses/Orbits: Negative IMPRESSION: 1. 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 2. Known poor or absent flow in the right ICA from prior CTA. Electronically Signed   By: Tiburcio Pea M.D.   On: 05/18/2022 07:34   CT ANGIO HEAD NECK W WO CM  Result Date: 05/18/2022 CLINICAL DATA:  Dizziness and fall EXAM: CT ANGIOGRAPHY HEAD AND NECK TECHNIQUE: Multidetector CT imaging of the head and neck was performed using the standard protocol during bolus administration of intravenous contrast. Multiplanar CT image reconstructions and MIPs were obtained to evaluate the vascular anatomy. Carotid stenosis measurements (when applicable) are obtained utilizing NASCET criteria, using the distal internal carotid diameter as the denominator. RADIATION DOSE REDUCTION: This exam was performed according to the departmental dose-optimization program which includes automated exposure control, adjustment of the mA and/or kV according to patient size and/or use of iterative reconstruction technique. CONTRAST:  75mL OMNIPAQUE IOHEXOL 350 MG/ML SOLN COMPARISON:  None Available. FINDINGS: CTA NECK FINDINGS SKELETON: There is no bony spinal canal stenosis. No lytic or blastic lesion. OTHER NECK: Normal pharynx, larynx and major salivary glands. No cervical lymphadenopathy. Unremarkable thyroid gland. UPPER CHEST: No pneumothorax or pleural effusion. No nodules or masses. AORTIC ARCH: There is no calcific atherosclerosis  of the aortic arch. There is no aneurysm, dissection or hemodynamically significant stenosis of the visualized portion of the aorta. Conventional 3 vessel aortic branching pattern. The visualized proximal subclavian arteries are widely patent. RIGHT CAROTID SYSTEM: There is severe stenosis of the right internal carotid artery along its entire length, progressively worsening until it becomes occluded  proximal to the skull base. LEFT CAROTID SYSTEM: No dissection, occlusion or aneurysm. Mild atherosclerotic calcification at the carotid bifurcation without hemodynamically significant stenosis. VERTEBRAL ARTERIES: Codominant configuration. Both origins are clearly patent. There is no dissection, occlusion or flow-limiting stenosis to the skull base (V1-V3 segments). CTA HEAD FINDINGS POSTERIOR CIRCULATION: --Vertebral arteries: Normal V4 segments. --Inferior cerebellar arteries: Normal. --Basilar artery: Normal. --Superior cerebellar arteries: Normal. --Posterior cerebral arteries (PCA): Normal. ANTERIOR CIRCULATION: --Intracranial internal carotid arteries: There is reconstitution of the right ICA at the distal petrous segment, likely due to collateral flow across the circle-of-Willis. --Anterior cerebral arteries (ACA): Normal. Both A1 segments are present. Patent anterior communicating artery (a-comm). --Middle cerebral arteries (MCA): Normal. VENOUS SINUSES: As permitted by contrast timing, patent. ANATOMIC VARIANTS: Fetal origin of the left posterior cerebral artery. Review of the MIP images confirms the above findings. IMPRESSION: 1. 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 2. Reconstitution of the right ICA at the distal petrous segment, likely due to collateral flow across the circle-of-Willis. 3. No intracranial arterial occlusion or hemodynamically significant stenosis. Critical Value/emergent results were called by  telephone at the time of interpretation on 05/18/2022 at 3:15 am to provider [facility] , who verbally acknowledged these results. Electronically Signed   By: Deatra Robinson M.D.   On: 05/18/2022 03:16   CT Head Wo Contrast  Result Date: 05/18/2022 CLINICAL DATA:  Altered mental status and dizziness EXAM: CT HEAD WITHOUT CONTRAST TECHNIQUE: Contiguous axial images were obtained from the base of the skull through the vertex without intravenous contrast. RADIATION DOSE REDUCTION: This exam was performed according to the departmental dose-optimization program which includes automated exposure control, adjustment of the mA and/or kV according to patient size and/or use of iterative reconstruction technique. COMPARISON:  04/25/2022 FINDINGS: Brain: There is wedge-shaped area of decreased attenuation identified in the right posterior parietal lobe consistent with infarct in evolution. No other focal area of ischemia is noted. No hemorrhage is seen. Vascular: No hyperdense vessel or unexpected calcification. Skull: Normal. Negative for fracture or focal lesion. Sinuses/Orbits: No acute finding. Other: None. IMPRESSION: Wedge-shaped area in the right posterior parietal lobe consistent with an evolving infarct. This appears acute to subacute in nature. Electronically Signed   By: Alcide Clever M.D.   On: 05/18/2022 00:52   DG Chest Portable 1 View  Result Date: 05/18/2022 CLINICAL DATA:  Recent fall EXAM: PORTABLE CHEST 1 VIEW COMPARISON:  02/23/2014 FINDINGS: The heart size and mediastinal contours are within normal limits. Both lungs are clear. The visualized skeletal structures are unremarkable. IMPRESSION: No active disease. Electronically Signed   By: Alcide Clever M.D.   On: 05/18/2022 00:50     Assessment: 61 year old female presenting in transfer from OSH for evaluation of  1. Neurological exam is nonfocal.  2. CT of brain: Wedge-shaped area in the right posterior parietal lobe consistent with an evolving  infarct. This appears acute to subacute in nature. 3. 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 Reconstitution of the right ICA at the distal petrous segment, likely due to collateral flow across the circle-of-Willis. No intracranial arterial occlusion or hemodynamically significant stenosis. 4. EEG: Intermittent slowing in the right temporal region. This finding is consistent with the stroke seen on CT.  5. MRI brain: Acute and subacute infarcts in the right MCA  distribution, the largest area being a subacute infarct in the right parietal cortex. Known poor or absent flow in the right ICA from prior CTA. 6. EKG: Sinus rhythm. Abnormal R-wave progression, early transition. 7. Vascular surgery has seen the patient and they have reviewed her CTA; their note states that although a dissection flap is not definitively seen, she does have evidence of distal ICA occlusion at the skull base. Vascular Surgery has ordered a carotid ultrasound to confirm distal ICA occlusion on the right.  Recommendations: 1. Carotid ultrasound is pending 2. Continue ASA and Plavix.  3. BP goal. SBP of 120-140 by gradual reduction (15% per day). Out of permissive HTN time window given that the age of the stroke as seen on CT is most likely > 24 hours.  4. TTE 5. HgbA1c, fasting lipid panel 6. MRI of the brain without contrast 7. PT consult, OT consult, Speech consult 8. Statin 9. Risk factor modification. Advised on the importance of smoking cessation 10. Telemetry monitoring 11. Frequent neuro checks 12 NPO until passes stroke swallow screen    Addendum: Carotid ultrasound: Velocities in the right ICA are consistent with a 80-99%  stenosis. No obvious evidence of dissection. Velocities in the left ICA are consistent with a 60-79% stenosis, however this may be compensatory flow. Bilateral  vertebral arteries demonstrate antegrade flow. Normal flow hemodynamics were seen in bilateral subclavian arteries.   Electronically signed: Dr. Caryl Pina 05/18/2022, 11:54 PM

## 2022-05-18 NOTE — Assessment & Plan Note (Addendum)
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

## 2022-05-18 NOTE — Consult Note (Signed)
[facility] TeleNeurology Consult Services  Stat Consult  Patient Name:   Diamond Adams, Diamond Adams Date of Birth:   [birth year] Identification Number:   MRN - [id number] Date of Service:   05/18/2022 01:09:43  Diagnosis: I63.9 - Cerebrovascular accident (CVA), unspecified mechanism (HCC) R56.9 - Seizures R51.9 - Headache, unspecified  Impression Pt is a 61 YOF with PMH of ANXIETY/DEPRESSION, FIBROMYALGIA who presetned with compalint of intermittent numbness/tinlging on left side, weakness all over, left facial symptoms, fall/dizziness symptoms. NIHSS: 0. Deferred thromoblytics as LKN not clear. Her timeline and complaints are not totally fitting stroke, but will workup for various possibilities. Admit for further workup for stroke vs malignancy vs autoimmune process vs TOXIC/METABOLIC/INFECTIOUS process.  Monitor neuro checks/VS q4h with telemetry. Recommend fall precautions and seizure precautions. Goal SBP b/w 160-180 today, 140-160 -> 100-140 afterwards. Start ASA + STATIN if no contraindications. Get MRI BRAIN W/ and W/O, routine CTA HEAD/NECK, EEG, and ECHO. Get COVID, UDS, ETOH, ESR/CRP, TROP, CK, TSH, B12, BNP, LACTIC ACID, LIPID PANEL, and A1C. Get WORKUP for TOXIC/METABOLIC/INFECTIOUS causes. PT/OT/ST eval. Give Lorazepam 2 mg IV PRN for seizures > 3 min or > 3 episodes in one hour. Don't give more than 6-8 mg total in 24 hour period. Please avoid aminophylline, theophylline, isoniazid, lindane, metronidazole, nalidixic acid, meropenem/imipenem, cefepime, TCAs, bupropion, cyclosporine, chlorambucil, tramadol, clozapine, or other drugs which can lower seizure threshold.    Recommendations: Our recommendations are outlined below.  Diagnostic Studies : MRI brain w/wo contrast Routine CTA head and neck with contrast Transthoracic ECHO Routine EEG  Laboratory Studies : Check B12/folateTSHblood cultures x 2ESR/CRPHIVLipid PanelHbA1cCOIVD test  Nursing  Recommendations : Delirium precautions: Blinds open during the day, closed at night, frequent reorientation, minimize nighttime interruptionsWhen possible avoid benzodiazepines, opioid pain medications, and anticholinergic medications  Consultations : Toxic metabolic work up per primary team  DVT Prophylaxis : Choice of Primary TeamSCDs, Pneumatic Compression  Disposition : Neurology will follow  ----------------------------------------------------------------------------------------------------    Metrics: [facility] Notification Time: 05/18/2022 01:08:33 Stamp Time: 05/18/2022 01:09:43 Callback Response Time: 05/18/2022 01:10:27   CT HEAD: Wedge-shaped area in the right posterior parietal lobe consistent with an evolving infarct. This appears acute to subacute in nature.      Labs -WBC: 17.9, Na: 134, K: 3.1,   ----------------------------------------------------------------------------------------------------  Chief Complaint: intermittent numbness/tinlging on left side, weakness all over, left facial symptoms, fall/dizziness  History of Present Illness: Patient is a 61 year old Female. Pt is a 61 YOF with PMH of ANXIETY/DEPRESSION, FIBROMYALGIA who presetned with compalint of intermittent numbness/tinlging on left side, weakness all over, left facial symptoms, fall/dizziness symptoms. She had a fall and dizziness, and kalidoscope vision in both eyes, no stiffening/shaking, no tongue biting, no BI/UI with event. She had dizziness and headache on 04/25/22. She noted she's had issues for last couple of weeks and getting worse. She smokes 1pk over 2 days. No prior strokes or malignancy. She denied any hormone meds.     Past Medical History:      Hypertension      Hyperlipidemia  Medications:  No Anticoagulant use  No Antiplatelet use Reviewed EMR for current medications  Allergies:  Reviewed  Social History: Smoking: Yes Alcohol Use: No Drug Use:  No  Family History:  There is no family history of premature cerebrovascular disease pertinent to this consultation  ROS : 14 Points Review of Systems was performed and was negative except mentioned in HPI.  Past Surgical History: There Is No Surgical History Contributory To Today's Visit  Examination: BP(167/85), Pulse(73), Blood Glucose(107) 1A: Level of Consciousness - Alert; keenly responsive + 0 1B: Ask Month and Age - Both Questions Right + 0 1C: Blink Eyes & Squeeze Hands - Performs Both Tasks + 0 2: Test Horizontal Extraocular Movements - Normal + 0 3: Test Visual Fields - No Visual Loss + 0 4: Test Facial Palsy (Use Grimace if Obtunded) - Normal symmetry + 0 5A: Test Left Arm Motor Drift - No Drift for 10 Seconds + 0 5B: Test Right Arm Motor Drift - No Drift for 10 Seconds + 0 6A: Test Left Leg Motor Drift - No Drift for 5 Seconds + 0 6B: Test Right Leg Motor Drift - No Drift for 5 Seconds + 0 7: Test Limb Ataxia (FNF/Heel-Shin) - No Ataxia + 0 8: Test Sensation - Normal; No sensory loss + 0 9: Test Language/Aphasia - Normal; No aphasia + 0 10: Test Dysarthria - Normal + 0 11: Test Extinction/Inattention - No abnormality + 0  NIHSS Score: 0     Patient / Family was informed the Neurology Consult would occur via TeleHealth consult by way of interactive audio and video telecommunications and consented to receiving care in this manner.  Patient is being evaluated for possible acute neurologic impairment and high probability of imminent or life - threatening deterioration.I spent total of 35 minutes providing care to this patient, including time for face to face visit via telemedicine, review of medical records, imaging studies and discussion of findings with providers, the patient and / or family.   Dr Marcene Corning   [facility] [phone]  Case [id number]

## 2022-05-18 NOTE — Assessment & Plan Note (Addendum)
Per vascular Planning TCAR 7/6

## 2022-05-18 NOTE — Assessment & Plan Note (Addendum)
Continue statin. 

## 2022-05-18 NOTE — Assessment & Plan Note (Addendum)
Advised on the importance of cessation

## 2022-05-18 NOTE — Progress Notes (Signed)
PROGRESS NOTE    Patient: Diamond Adams                            PCP: Ignatius Specking, MD                    DOB: July 03, 1961            DOA: 05/17/2022 PIR:[id number]             DOS: 05/18/2022, 10:07 AM   LOS: 0 days   Date of Service: The patient was seen and examined on 05/18/2022  Subjective:   The patient was seen and examined this morning. Stable at this time. Still complaining of :  Otherwise no issues overnight .  Brief Narrative:    Diamond Adams is a 61 y.o. female with medical history significant of anxiety disorder, depression, fibromyalgia, hypertension, and more presents the ED with a chief complaint of weakness and fall.   Patient reports that for several weeks, more than 2 maybe not quite 4 weeks, patient has had weakness in changes in her vision.  Patient reports that if she tries to focus on something she becomes generally weak.  She has weakness in both her upper extremities and lower extremities as well as paresthesias in her hands and feet.  Patient reports the symptoms last for 30 minutes at a time and are intermittent.  She describes her change in vision as seeing white flashes and kaleidoscope patterns in her vision.  She does not report that it is double vision or blurry vision.  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  Patient reports that today she had intermittent numbness in her tongue, left face, and her throat.  She denies any facial droop, drooling, difficulty swallowing, but her throat now feels sore.  She also developed a dry cough today, and is repeating the try to clear her throat.  Patient is protecting her airway, maintaining oxygen saturations on room air.  Subacute infarct was found on CT that did not really explain her symptoms.    Tele-neurology recommended stroke work-up as well as metabolic work-up.  Part of the stroke work-up was to have a CTA.  CTA shows carotid  dissection.  Vascular surgery consulted and reports that they almost never do surgery on this, but to start aspirin and they will see patient when she arrives.  Neurology was consulted and advises to load with Plavix, and they will see when she arrives.  Patient being transferred to [facility].  ED Temp 98.9, HR 61-85, RR 14-20, BP 149/68, satting at 99% Patient has a leukocytosis of 17.9, hemoglobin 15.6 Chemistry reveals a hypokalemia CT head shows a wedge-shaped area in the right posterior parietal lobe consistent with an evolving infarct or subacute infarct Teleneurology was consulted and recommended starting aspirin and doing a stroke and metabolic work-up CTA came back with carotid dissection-vascular consulted and recommends aspirin as well, and transferred to [facility].  Neurology was consulted and recommends loading with Plavix and transferred to [facility]    Assessment & Plan:   Principal Problem:   Carotid artery dissection (HCC) Active Problems:   Generalized weakness   Hypokalemia   Depression   HLD (hyperlipidemia)   CVA (cerebral vascular accident) (HCC)   Tobacco use disorder   Leukocytosis     Assessment and Plan: *  Carotid artery dissection (HCC) -Per CT findings -Currently stable -Vasc surgeon advises xfer to [facility], [facility],  -Vascular surgery consulted, to be seen on arrival at [facility]  -BP goal: SBP<180 -Neurology consulted: load with plavix and continue plavix daily, MRI upon arrival - admit to progressive -RN communication to notify vascular and neuro upon arrival to [facility] -[facility] and plavix started  -Continue plavix -continue to monitor on progressive  Leukocytosis - Acute phase reaction in the setting of stroke and carotid dissection -Dry cough started today, afebrile, - chest x-ray -negative for any infiltrate -UA - is not indicative of UTI -No other infectious symptoms -Monitoring closely (afebrile, normotensive) -Blood cultures been obtained  Tobacco  use disorder - Advised on the importance of cessation  CVA (cerebral vascular accident) (HCC) -Likely subacute CVA -Echo >> -MRI >>> -PT/OT/speech -consulted -CTA shows carotid dissection --> see plan for carotid dissection -BP goal: SBP <180 -Lipid panel >>> LDL 83, HDL 40, TG 75 -Hemoglobin Z6X 5.4 TSH 1.36  -Continue asa and plavix -Neurology consulted follow accordingly   HLD (hyperlipidemia) -Continue statin  Depression -Continue prozac  Hypokalemia -Replace and recheck  Generalized weakness -Atypical presentation -CVA and Carotid dissection both contributing -r/o nutritional deficiencies and other metabolic etiologies  -Pending PT/OT evaluation -Continue to monitor      ----------------------------------------------------------------------------------------------------------------------------------------------- Nutritional status:  The patient's BMI is: Body mass index is 27.59 kg/m. I agree with the assessment and plan as outlined ----------------------------------------------------------------------------------------------------------------------------------------------- Cultures; Blood Cultures x 2 >> NGT  ----------------------------------------------------------------------------------------------------------------------------------------------  DVT prophylaxis:  heparin injection 5,000 Units Start: 05/18/22 0600 SCD's Start: 05/18/22 0329   Code Status:   Code Status: Full Code  Family Communication: No family member present at bedside- attempt will be made to update daily The above findings and plan of care has been discussed with patient (and family)  in detail,  they expressed understanding and agreement of above. -Advance care planning has been discussed.   Admission status:   Status is: Inpatient Remains inpatient appropriate because: Needing close observation, neuro neurovascular consultation evaluation, neuro/VA  work-up     Procedures:   No admission procedures for hospital encounter.   Antimicrobials:  Anti-infectives (From admission, onward)    None        Medication:   [START ON 05/19/2022]  stroke: early stages of recovery book   Does not apply Once   aspirin EC  81 mg Oral Daily   [START ON 05/19/2022] clopidogrel  75 mg Oral Daily   FLUoxetine  10 mg Oral Daily   heparin  5,000 Units Subcutaneous Q8H   pravastatin  40 mg Oral Daily    acetaminophen **OR** acetaminophen (TYLENOL) oral liquid 160 mg/5 mL **OR** acetaminophen, hydrALAZINE, ondansetron (ZOFRAN) IV, senna-docusate   Objective:   Vitals:   05/18/22 0600 05/18/22 0630 05/18/22 0919 05/18/22 0920  BP: (!) 127/48 138/71 138/68   Pulse: (!) 53 (!) 51  61  Resp: (!) 21 20  15   Temp:      SpO2: 94% 94%  95%  Weight:      Height:       No intake or output data in the 24 hours ending 05/18/22 1007 Filed Weights   05/17/22 2334  Weight: 59.9 kg     Examination:   Physical Exam  Constitution:  Alert, cooperative, no distress,  Appears calm and comfortable  Psychiatric:   Normal and stable mood and affect, cognition intact,   HEENT:        Normocephalic, PERRL, otherwise with in  Normal limits  Chest:         Chest symmetric Cardio vascular:  S1/S2, RRR, No murmure, No Rubs or Gallops  pulmonary: Clear to auscultation bilaterally, respirations unlabored, negative wheezes / crackles Abdomen: Soft, non-tender, non-distended, bowel sounds,no masses, no organomegaly Muscular skeletal: Limited exam - in bed, able to move all 4 extremities,   Neuro: CNII-XII intact. , normal motor and sensation, reflexes intact  Extremities: No pitting edema lower extremities, +2 pulses  Skin: Dry, warm to touch, negative for any Rashes, No open wounds Wounds: per nursing documentation   ------------------------------------------------------------------------------------------------------------------------------------------     LABs:     Latest Ref Rng & Units 05/18/2022    4:10 AM 05/18/2022   12:11 AM 04/25/2022    9:39 PM  CBC  WBC 4.0 - 10.5 K/uL 17.6  17.9  23.6   Hemoglobin 12.0 - 15.0 g/dL 16.1  09.6  04.5   Hematocrit 36.0 - 46.0 % 42.0  44.5  44.8   Platelets 150 - 400 K/uL 273  264  297       Latest Ref Rng & Units 05/18/2022    4:10 AM 05/18/2022   12:11 AM 04/25/2022    9:39 PM  CMP  Glucose 70 - 99 mg/dL 96  409  811   BUN 6 - 20 mg/dL 10  12  12    Creatinine 0.44 - 1.00 mg/dL 9.14  7.82  9.56   Sodium 135 - 145 mmol/L 136  134  141   Potassium 3.5 - 5.1 mmol/L 3.4  3.1  3.1   Chloride 98 - 111 mmol/L 104  101  111   CO2 22 - 32 mmol/L 24  24  24    Calcium 8.9 - 10.3 mg/dL 8.6  9.0  9.0   Total Protein 6.5 - 8.1 g/dL 6.2  7.4  6.7   Total Bilirubin 0.3 - 1.2 mg/dL 0.6  0.4  0.4   Alkaline Phos 38 - 126 U/L 75  89  80   AST 15 - 41 U/L 12  16  17    ALT 0 - 44 U/L 16  19  17         Micro Results Recent Results (from the past 240 hour(s))  Culture, blood (Routine X 2) w Reflex to ID Panel     Status: None (Preliminary result)   Collection Time: 05/18/22  4:10 AM   Specimen: Vein; Blood  Result Value Ref Range Status   Specimen Description   Final    BLOOD LEFT HAND BOTTLES DRAWN AEROBIC AND ANAEROBIC   Special Requests   Final    Blood Culture adequate volume Performed at [facility], [address], [place], [place] [address]    Culture PENDING  Incomplete   Report Status PENDING  Incomplete  Culture, blood (Routine X 2) w Reflex to ID Panel     Status: None (Preliminary result)   Collection Time: 05/18/22  4:10 AM   Specimen: Vein; Blood  Result Value Ref Range Status   Specimen Description   Final    BLOOD RIGHT HAND BOTTLES DRAWN AEROBIC AND ANAEROBIC   Special Requests   Final    Blood Culture adequate volume Performed at [facility], [address], [place], [place] [address]    Culture PENDING  Incomplete   Report Status PENDING  Incomplete    Radiology  Reports MR BRAIN W WO CONTRAST  Result Date: 05/18/2022 CLINICAL DATA:  Dizziness with numbness in all 4 extremities.  Fall. EXAM: MRI HEAD WITHOUT AND WITH CONTRAST TECHNIQUE: Multiplanar, multiecho pulse sequences of the brain and surrounding structures were obtained without and with intravenous contrast. CONTRAST:  7mL GADAVIST GADOBUTROL 1 MMOL/ML IV SOLN COMPARISON:  Head CT and CTA from earlier today FINDINGS: Brain: Patchy acute infarcts in the right cerebral hemisphere, most confluent in the parietal cortex and subcortical white matter but small infarcts also seen roughly aligned along the right MCA/ACA watershed, in the right periatrial white matter, and near the anterior commissure. Mild mineralization at the parietal cortex infarct which is also enhancing. Some of the watershed type infarcts in the right corona radiata are enhancing as well. No hydrocephalus or masslike finding. No prior gray matter infarcts. Vascular: Absent right ICA flow void in the neck and skull base, known from prior CTA. Skull and upper cervical spine: Normal marrow signal. Sinuses/Orbits: Negative IMPRESSION: 1. 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 2. Known poor or absent flow in the right ICA from prior CTA. Electronically Signed   By: Tiburcio Pea M.D.   On: 05/18/2022 07:34   CT ANGIO HEAD NECK W WO CM  Result Date: 05/18/2022 CLINICAL DATA:  Dizziness and fall EXAM: CT ANGIOGRAPHY HEAD AND NECK TECHNIQUE: Multidetector CT imaging of the head and neck was performed using the standard protocol during bolus administration of intravenous contrast. Multiplanar CT image reconstructions and MIPs were obtained to evaluate the vascular anatomy. Carotid stenosis measurements (when applicable) are obtained utilizing NASCET criteria, using the distal internal carotid diameter as the denominator. RADIATION DOSE REDUCTION: This exam was  performed according to the departmental dose-optimization program which includes automated exposure control, adjustment of the mA and/or kV according to patient size and/or use of iterative reconstruction technique. CONTRAST:  75mL OMNIPAQUE IOHEXOL 350 MG/ML SOLN COMPARISON:  None Available. FINDINGS: CTA NECK FINDINGS SKELETON: There is no bony spinal canal stenosis. No lytic or blastic lesion. OTHER NECK: Normal pharynx, larynx and major salivary glands. No cervical lymphadenopathy. Unremarkable thyroid gland. UPPER CHEST: No pneumothorax or pleural effusion. No nodules or masses. AORTIC ARCH: There is no calcific atherosclerosis of the aortic arch. There is no aneurysm, dissection or hemodynamically significant stenosis of the visualized portion of the aorta. Conventional 3 vessel aortic branching pattern. The visualized proximal subclavian arteries are widely patent. RIGHT CAROTID SYSTEM: There is severe stenosis of the right internal carotid artery along its entire length, progressively worsening until it becomes occluded proximal to the skull base. LEFT CAROTID SYSTEM: No dissection, occlusion or aneurysm. Mild atherosclerotic calcification at the carotid bifurcation without hemodynamically significant stenosis. VERTEBRAL ARTERIES: Codominant configuration. Both origins are clearly patent. There is no dissection, occlusion or flow-limiting stenosis to the skull base (V1-V3 segments). CTA HEAD FINDINGS POSTERIOR CIRCULATION: --Vertebral arteries: Normal V4 segments. --Inferior cerebellar arteries: Normal. --Basilar artery: Normal. --Superior cerebellar arteries: Normal. --Posterior cerebral arteries (PCA): Normal. ANTERIOR CIRCULATION: --Intracranial internal carotid arteries: There is reconstitution of the right ICA at the distal petrous segment, likely due to collateral flow across the circle-of-Willis. --Anterior cerebral arteries (ACA): Normal. Both A1 segments are present. Patent anterior communicating  artery (a-comm). --Middle cerebral arteries (MCA): Normal. VENOUS SINUSES: As permitted by contrast timing, patent. ANATOMIC VARIANTS: Fetal origin of the left posterior cerebral artery. Review of the MIP images confirms the above findings. IMPRESSION: 1. 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 2.  Reconstitution of the right ICA at the distal petrous segment, likely due to collateral flow across the circle-of-Willis. 3. No intracranial arterial occlusion or hemodynamically significant stenosis. Critical Value/emergent results were called by telephone at the time of interpretation on 05/18/2022 at 3:15 am to provider [facility] , who verbally acknowledged these results. Electronically Signed   By: Deatra Robinson M.D.   On: 05/18/2022 03:16   CT Head Wo Contrast  Result Date: 05/18/2022 CLINICAL DATA:  Altered mental status and dizziness EXAM: CT HEAD WITHOUT CONTRAST TECHNIQUE: Contiguous axial images were obtained from the base of the skull through the vertex without intravenous contrast. RADIATION DOSE REDUCTION: This exam was performed according to the departmental dose-optimization program which includes automated exposure control, adjustment of the mA and/or kV according to patient size and/or use of iterative reconstruction technique. COMPARISON:  04/25/2022 FINDINGS: Brain: There is wedge-shaped area of decreased attenuation identified in the right posterior parietal lobe consistent with infarct in evolution. No other focal area of ischemia is noted. No hemorrhage is seen. Vascular: No hyperdense vessel or unexpected calcification. Skull: Normal. Negative for fracture or focal lesion. Sinuses/Orbits: No acute finding. Other: None. IMPRESSION: Wedge-shaped area in the right posterior parietal lobe consistent with an evolving infarct. This appears acute to subacute in nature. Electronically Signed   By: Alcide Clever  M.D.   On: 05/18/2022 00:52   DG Chest Portable 1 View  Result Date: 05/18/2022 CLINICAL DATA:  Recent fall EXAM: PORTABLE CHEST 1 VIEW COMPARISON:  02/23/2014 FINDINGS: The heart size and mediastinal contours are within normal limits. Both lungs are clear. The visualized skeletal structures are unremarkable. IMPRESSION: No active disease. Electronically Signed   By: Alcide Clever M.D.   On: 05/18/2022 00:50    SIGNED: Kendell Bane, MD, FHM. [facility],  Pager (please use [email or web address] to page/text) Please use Epic Secure Chat for non-urgent communication (7AM-7PM)  If 7PM-7AM, please contact night-coverage [email or web address], 05/18/2022, 10:07 AM

## 2022-05-18 NOTE — Assessment & Plan Note (Addendum)
PT/OT

## 2022-05-18 NOTE — Assessment & Plan Note (Addendum)
Mild, downtrending, follow CXR without acute disease Afebrile Blood cx no growth  UA not c/w UTI Follow

## 2022-05-18 NOTE — Assessment & Plan Note (Addendum)
Continue prozac. 

## 2022-05-18 NOTE — ED Notes (Signed)
Report given to Nick RN

## 2022-05-18 NOTE — ED Notes (Signed)
Pt taken to bathroom via wheelchair and assisted back to bed; pt given breakfast tray

## 2022-05-18 NOTE — ED Notes (Signed)
Vascular Dr Chestine Spore paged to Dr Clayborne Dana @ [phone]

## 2022-05-18 NOTE — Procedures (Signed)
Patient Name: Diamond Adams  MRN: [id number]  Epilepsy Attending: Charlsie Quest  Referring Physician/Provider: Lilyan Gilford, DO  Date: 05/18/2022  Duration: 25.48 mins  Patient history: 59 YOF presetned with compalint of intermittent numbness/tinlging on left side, weakness all over, left facial symptoms, fall/dizziness symptoms. EEG to evaluate for seizure.  Level of alertness: Awake  AEDs during EEG study: None  Technical aspects: This EEG study was done with scalp electrodes positioned according to the 10-20 International system of electrode placement. Electrical activity was acquired at a sampling rate of 500Hz  and reviewed with a high frequency filter of 70Hz  and a low frequency filter of 1Hz . EEG data were recorded continuously and digitally stored.   Description: The posterior dominant rhythm consists of 9 Hz activity of moderate voltage (25-35 uV) seen predominantly in posterior head regions, symmetric and reactive to eye opening and eye closing. EEG showed intermittent 3 to 6 Hz theta-delta slowing in right temporal region. Physiologic photic driving was not seen during photic stimulation.  Hyperventilation was not performed.     ABNORMALITY - Intermittent slow, right temporal region.  IMPRESSION: This study is suggestive of cortical dysfunction arising from right temporal region, likely secondary to underlying structural abnormality. No seizures or epileptiform discharges were seen throughout the recording.  [name]

## 2022-05-18 NOTE — Hospital Course (Addendum)
This 61 years old female with PMH significant for anxiety disorder, depression, fibromyalgia, hypertension presents in the  [facility] ED with c/o: generalized weakness x 2 weeks and subsequent fall.  Patient reports intermittent episodes of bilateral upper and lower extremity weakness with paresthesia. She also reports bilateral blurred vision.  All her symptoms returned to baseline.  CT head revealed Riven Beebe wedge-shaped area in the right posterior parietal lobe consistent with evolving infarct.  Patient was seen by telemetry neurology, recommended CVA work-up.  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  Vascular surgery was consulted recommended transfer to [facility] for further evaluation.   She had MRI showing acute and subacute infarcts in the R MCA distribution.  The largest area being Gayla Benn subacute infarct in the right parietal cortex.  Now s/p angiogram which showed greater than 90% stenosis at the ostia of the right internal carotid artery.  Plan at this point is for TCAR on 7/6.  Discharge to CIR today.  See below for additional details

## 2022-05-18 NOTE — ED Notes (Signed)
Patient back from CT.

## 2022-05-18 NOTE — ED Notes (Signed)
Pt assisted to bathroom via wheelchair and back to bed 

## 2022-05-18 NOTE — Assessment & Plan Note (Addendum)
Replace and recheck 

## 2022-05-19 ENCOUNTER — Inpatient Hospital Stay (HOSPITAL_COMMUNITY): Payer: Medicare Other

## 2022-05-19 DIAGNOSIS — I6523 Occlusion and stenosis of bilateral carotid arteries: Secondary | ICD-10-CM

## 2022-05-19 DIAGNOSIS — I6389 Other cerebral infarction: Secondary | ICD-10-CM

## 2022-05-19 DIAGNOSIS — I63231 Cerebral infarction due to unspecified occlusion or stenosis of right carotid arteries: Secondary | ICD-10-CM

## 2022-05-19 DIAGNOSIS — E782 Mixed hyperlipidemia: Secondary | ICD-10-CM | POA: Diagnosis not present

## 2022-05-19 DIAGNOSIS — I7771 Dissection of carotid artery: Secondary | ICD-10-CM | POA: Diagnosis not present

## 2022-05-19 DIAGNOSIS — M6281 Muscle weakness (generalized): Secondary | ICD-10-CM | POA: Diagnosis not present

## 2022-05-19 DIAGNOSIS — F17200 Nicotine dependence, unspecified, uncomplicated: Secondary | ICD-10-CM | POA: Diagnosis not present

## 2022-05-19 DIAGNOSIS — D72829 Elevated white blood cell count, unspecified: Secondary | ICD-10-CM | POA: Diagnosis not present

## 2022-05-19 LAB — ECHOCARDIOGRAM COMPLETE
AR max vel: 1.42 cm2
AV Area VTI: 1.28 cm2
AV Area mean vel: 1.36 cm2
AV Mean grad: 8 mmHg
AV Peak grad: 16.5 mmHg
Ao pk vel: 2.03 m/s
Area-P 1/2: 3.56 cm2
Height: 58 in
S' Lateral: 2 cm
Weight: 2112 oz

## 2022-05-19 MED ORDER — ASPIRIN 325 MG PO TBEC
325.0000 mg | DELAYED_RELEASE_TABLET | Freq: Every day | ORAL | Status: DC
  Administered 2022-05-19 – 2022-05-21 (×2): 325 mg via ORAL
  Filled 2022-05-19 (×2): qty 1

## 2022-05-19 MED ORDER — ROSUVASTATIN CALCIUM 20 MG PO TABS
20.0000 mg | ORAL_TABLET | Freq: Every day | ORAL | Status: DC
  Administered 2022-05-19 – 2022-05-21 (×3): 20 mg via ORAL
  Filled 2022-05-19 (×3): qty 1

## 2022-05-19 MED ORDER — ALPRAZOLAM 0.25 MG PO TABS
0.2500 mg | ORAL_TABLET | Freq: Every day | ORAL | Status: DC
  Administered 2022-05-19 – 2022-05-21 (×2): 0.25 mg via ORAL
  Filled 2022-05-19 (×3): qty 1

## 2022-05-19 NOTE — PMR Pre-admission (Signed)
PMR Admission Coordinator Pre-Admission Assessment  Patient: Diamond Adams is an 61 y.o., female MRN: [id number] DOB: July 14, 1961 Height: _0  (147.3 cm) Weight: 59.9 kg  Insurance Information HMO: yes    PPO:      PCP:      IPA:      80/20:      OTHER:  PRIMARY: [employer] medicare      Policy#: [id number]      Subscriber: pt CM Name:       Phone#: [phone]     Fax#: [phone] Pre-Cert#: [id number] auth for CIR from [employer] with updates due to fax listed above on 7/5      Employer:  Benefits:  Phone #: [phone]     Name:  Eff. Date: 11/23/21     Deduct: $0      Out of Pocket Max: $8300 (met $170.99)      Life Max:  CIR: $1556/admission      SNF: 20 full days  Outpatient: 80%     Co-Pay: 20% Home Health: 100%      Co-Pay:  DME: 80%     Co-Pay: 20% Providers:  SECONDARY:       Policy#:      Phone#:   Development worker, community:       Phone#:   The Therapist, art Information Summary" for patients in Inpatient Rehabilitation Facilities with attached "Privacy Act [place] Records" was provided and verbally reviewed with: Patient  Emergency Contact Information Contact Information     Name Relation Home Work Mobile   [place] Daughter [phone]         Current Medical History  Patient Admitting Diagnosis: CVA History of Present Illness:  Pt is a 61 y/o F with PMH consisting of  anxiety disorder, depression, fibromyalgia, hypertension, and more presenting to [facility] ED on 6/25 after fall with dizziness/numbness in all extremities and blurred vision. 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 Reconstitution of the right ICA was noted at the distal petrous segment, likely due to collateral flow across the circle-of-Willis. No intracranial arterial occlusion or hemodynamically  significant stenosis was seen. Vascular not planning to perform surgery. EKG: Sinus rhythm. Abnormal R-wave progression, early transition CT head revealing area in R posterior parietal lobe consistent with evolving/subactue infarct. MRI revealing acute/subacture infarcts in R MCA distribution -- largest in parietal cortex. Patient transferred to [facility] 05/18/22. EEG: Intermittent slowing in the right temporal region. This finding is consistent with the stroke seen on CT. EKG: Sinus rhythm. Abnormal R-wave progression, early transition. Patient seen by PT, OT and Speech who recommend CIR to assist with return to prior level of function.    Complete NIHSS TOTAL: 0  [facility]'s [facility] record from [facility] has been reviewed by the rehabilitation admission coordinator and physician.  Past Medical History  Past Medical History:  Diagnosis Date   Anxiety disorder    Depression    Fibromyalgia    Herpes    Hypertension    PONV (postoperative nausea and vomiting)     Has the patient had major surgery during 100 days prior to admission? No  Family History   family history includes Cancer in her sister; Emphysema in her mother; Heart disease in her father.  Current Medications  Current Facility-Administered Medications:    0.9 %  sodium chloride infusion, , Intravenous,  Continuous, Broadus John, MD, Last Rate: 100 mL/hr at 05/20/22 0553, New Bag at 05/20/22 0553   acetaminophen (TYLENOL) tablet 650 mg, 650 mg, Oral, Q4H PRN, 650 mg at 05/20/22 2212 **OR** [DISCONTINUED] acetaminophen (TYLENOL) 160 MG/5ML solution 650 mg, 650 mg, Per Tube, Q4H PRN **OR** acetaminophen (TYLENOL) suppository 650 mg, 650 mg, Rectal, Q4H PRN, Zierle-Ghosh, Asia B, DO   ALPRAZolam (XANAX) tablet 0.25 mg, 0.25 mg, Oral, QHS, Shawna Clamp, MD, 0.25 mg at 05/21/22 6962   aspirin EC tablet 325 mg, 325 mg, Oral, Daily, Rosalin Hawking, MD, 325 mg at 05/21/22 1013   clopidogrel (PLAVIX) tablet 75 mg, 75 mg,  Oral, Daily, Zierle-Ghosh, Asia B, DO, 75 mg at 05/21/22 1013   FLUoxetine (PROZAC) capsule 10 mg, 10 mg, Oral, Daily, Zierle-Ghosh, Asia B, DO, 10 mg at 05/21/22 1013   heparin injection 5,000 Units, 5,000 Units, Subcutaneous, Q8H, Zierle-Ghosh, Asia B, DO, 5,000 Units at 05/21/22 0620   hydrALAZINE (APRESOLINE) injection 10 mg, 10 mg, Intravenous, Q6H PRN, Zierle-Ghosh, Asia B, DO   hydrALAZINE (APRESOLINE) injection 5 mg, 5 mg, Intravenous, Q20 Min PRN, Broadus John, MD   labetalol (NORMODYNE) injection 10 mg, 10 mg, Intravenous, Q10 min PRN, Broadus John, MD   morphine (PF) 2 MG/ML injection 2 mg, 2 mg, Intravenous, Q4H PRN, Elodia Florence., MD, 2 mg at 05/20/22 1407   ondansetron (ZOFRAN) injection 4 mg, 4 mg, Intravenous, Q6H PRN, Zierle-Ghosh, Asia B, DO, 4 mg at 05/19/22 1211   ondansetron (ZOFRAN) injection 4 mg, 4 mg, Intravenous, Q6H PRN, Broadus John, MD   oxyCODONE (Oxy IR/ROXICODONE) immediate release tablet 5 mg, 5 mg, Oral, Q4H PRN, Elodia Florence., MD, 5 mg at 05/21/22 1013   pantoprazole (PROTONIX) EC tablet 40 mg, 40 mg, Oral, Daily, Rosalin Hawking, MD, 40 mg at 05/21/22 1013   rosuvastatin (CRESTOR) tablet 20 mg, 20 mg, Oral, Daily, Rosalin Hawking, MD, 20 mg at 05/21/22 1013   senna-docusate (Senokot-S) tablet 1 tablet, 1 tablet, Oral, QHS PRN, Zierle-Ghosh, Asia B, DO  Patients Current Diet:  Diet Order             Diet regular Room service appropriate? Yes; Fluid consistency: Thin  Diet effective now                   Precautions / Restrictions Precautions Precautions: Fall Restrictions Weight Bearing Restrictions: No   Has the patient had 2 or more falls or a fall with injury in the past year? No  Prior Activity Level Household: home ambulation without AD prior Limited Community (1-2x/wk): ambulated outside of home for MD appointments  Prior Functional Level Self Care: Did the patient need help bathing, dressing, using the toilet or  eating? Independent  Indoor Mobility: Did the patient need assistance with walking from room to room (with or without device)? Independent  Stairs: Did the patient need assistance with internal or external stairs (with or without device)? Independent  Functional Cognition: Did the patient need help planning regular tasks such as shopping or remembering to take medications? Independent  Patient Information Are you of Hispanic, Latino/a,or Spanish origin?: A. No, not of Hispanic, Latino/a, or Spanish origin What is your race?: A. White Do you need or want an interpreter to communicate with a doctor or health care staff?: 0. No  Patient's Response To:  Health Literacy and Transportation Is the patient able to respond to health literacy and transportation needs?: Yes Health Literacy - How often  do you need to have someone help you when you read instructions, pamphlets, or other written material from your doctor or pharmacy?: Sometimes In the past 12 months, has lack of transportation kept you from medical appointments or from getting medications?: Yes In the past 12 months, has lack of transportation kept you from meetings, work, or from getting things needed for daily living?: Yes  Home Assistive Devices / [place] Devices/Equipment: None Home Equipment: None  Prior Device Use: Indicate devices/aids used by the patient prior to current illness, exacerbation or injury? None of the above  Current Functional Level Cognition  Overall Cognitive Status: Impaired/Different from baseline Current Attention Level: Selective Orientation Level: Oriented X4 Following Commands: Follows one step commands with increased time, Follows one step commands consistently Attention: Focused, Sustained Focused Attention: Appears intact Sustained Attention: Appears intact Memory: Impaired Memory Impairment: Decreased short term memory Decreased Short Term Memory: Verbal basic Problem  Solving: Impaired Problem Solving Impairment: Functional basic Executive Function: Reasoning Reasoning: Appears intact    Extremity Assessment (includes Sensation/Coordination)  Upper Extremity Assessment: Defer to OT evaluation  Lower Extremity Assessment: Generalized weakness, Overall WFL for tasks assessed, LLE deficits/detail (>4/5) LLE Deficits / Details: mild incoordination with gait.    ADLs  Overall ADL's : Needs assistance/impaired Eating/Feeding: Set up, Sitting Grooming: Min guard, Sitting Upper Body Bathing: Minimal assistance, Sitting Lower Body Bathing: Minimal assistance, Sitting/lateral leans Upper Body Dressing : Minimal assistance, Sitting Lower Body Dressing: Minimal assistance, Sitting/lateral leans, Sit to/from stand Toilet Transfer: Min guard, Rolling walker (2 wheels) Toileting- Clothing Manipulation and Hygiene: Min guard, Sitting/lateral lean Functional mobility during ADLs: Min guard, Rolling walker (2 wheels)    Mobility  Overal bed mobility: Needs Assistance Bed Mobility: Supine to Sit, Sit to Supine Supine to sit: Supervision Sit to supine: Supervision    Transfers  Overall transfer level: Needs assistance Equipment used: Rolling walker (2 wheels) Transfers: Sit to/from Stand Sit to Stand: Min guard, Supervision    Ambulation / Gait / Stairs / Wheelchair Mobility  Ambulation/Gait Ambulation/Gait assistance: Counsellor (Feet): 20 Feet (x2) Assistive device: Rolling walker (2 wheels) Gait Pattern/deviations: Step-to pattern, Decreased step length - right, Decreased step length - left, Decreased stride length General Gait Details: L LE mildly uncoordinated with swing through and stance, Generally appears for guarded than unsteady Gait velocity interpretation: <1.8 ft/sec, indicate of risk for recurrent falls    Posture / Balance Balance Overall balance assessment: Needs assistance Sitting-balance support: Feet supported, Bilateral  upper extremity supported Sitting balance-Leahy Scale: Good Standing balance support: During functional activity, Reliant on assistive device for balance Standing balance-Leahy Scale: Fair Standing balance comment: can static stand without UE support to pull up pants and wash hands at the sink    Special needs/care consideration Special service needs plan TCAR with [facility] on 7/6   Previous Home Environment (from acute therapy documentation) Living Arrangements: Children (daughter works outside the home, job doesn't allow working from home.)  Lives With: Daughter Available Help at Discharge: Available PRN/intermittently Type of Home: [place]: Two level, Able to live on main level with bedroom/bathroom Home Access: Stairs to enter Technical brewer of Steps: 3 Bathroom Shower/Tub: Chiropodist: Handicapped height [place]: No  Discharge [place] for Discharge Living Setting: House, Lives with (comment) (daughter) Type of Home at Discharge: [place]: Two level, Able to live on main level with bedroom/bathroom Alternate Level Stairs-Number of Steps: flight Discharge Home Access:  Stairs to enter Entrance Stairs-Rails: None Entrance Stairs-Number of Steps: 3 Discharge Bathroom Shower/Tub: Tub/shower unit Discharge Bathroom Toilet: Handicapped height Does the patient have any problems obtaining your medications?: No  Social/Family/Support Systems Patient Roles: Parent, Caregiver (watches grandchildren) Anticipated Caregiver: Daughter, Maryln Manuel Anticipated Caregiver's Contact Information: [phone] Ability/Limitations of Caregiver: Daughter works Building control surveyor Availability: Intermittent  Goals Patient/Family Goal for Rehab: Mod I PT/OT, SLP supervision Expected length of stay: 5-7 days  Barriers to Discharge: Lack of/limited family support, Insurance for SNF coverage  Decrease burden of Care through IP  rehab admission: Specialzed equipment needs, Decrease number of caregivers, and Patient/family education  Possible need for SNF placement upon discharge: Not anticipated  Patient Condition: I have reviewed medical records from [facility], spoken with  TOC , and patient. I met with patient at the bedside for inpatient rehabilitation assessment.  Patient will benefit from ongoing PT, OT, and SLP, can actively participate in 3 hours of therapy a day 5 days of the week, and can make measurable gains during the admission.  Patient will also benefit from the coordinated team approach during an Inpatient Acute Rehabilitation admission.  The patient will receive intensive therapy as well as Rehabilitation physician, nursing, social worker, and care management interventions.  Due to safety, skin/wound care, disease management, medication administration, and patient education the patient requires 24 hour a day rehabilitation nursing.  The patient is currently min assist to min guard with mobility and basic ADLs.  Discharge setting and therapy post discharge at home with home health is anticipated.  Patient has agreed to participate in the Acute Inpatient Rehabilitation Program and will admit today.  Preadmission Screen Completed By: Amanda Cockayne, PT, with day of admit updates by  Michel Santee, 05/21/2022 10:20 AM ______________________________________________________________________   Discussed status with Dr. Dagoberto Ligas on 05/21/22  at 10:20 AM and received approval for admission today.  Admission Coordinator: Amanda Cockayne, PT, and Michel Santee, PT, time 10:20 AM Sudie Grumbling 05/21/22    Assessment/Plan: Diagnosis: Does the need for close, 24 hr/day Medical supervision in concert with the patient's rehab needs make it unreasonable for this patient to be served in a less intensive setting? Yes Co-Morbidities requiring supervision/potential complications: FMS, anxiety, depression, CVA, carotid dissection Due  to bladder management, bowel management, safety, skin/wound care, disease management, medication administration, pain management, and patient education, does the patient require 24 hr/day rehab nursing? Yes Does the patient require coordinated care of a physician, rehab nurse, PT, OT, and SLP to address physical and functional deficits in the context of the above medical diagnosis(es)? Yes Addressing deficits in the following areas: balance, endurance, locomotion, strength, transferring, bowel/bladder control, bathing, dressing, feeding, grooming, toileting, cognition, and speech Can the patient actively participate in an intensive therapy program of at least 3 hrs of therapy 5 days a week? Yes The potential for patient to make measurable gains while on inpatient rehab is good Anticipated functional outcomes upon discharge from inpatient rehab: modified independent and supervision PT, modified independent and supervision OT, supervision SLP Estimated rehab length of stay to reach the above functional goals is: 5-7 days Anticipated discharge destination: Home 10. Overall Rehab/Functional Prognosis: good   MD Signature:

## 2022-05-20 ENCOUNTER — Encounter (HOSPITAL_COMMUNITY)
Admission: EM | Disposition: A | Discharge status: Rehabilitation Facility | Payer: Self-pay | Source: Home / Self Care | Attending: Family Medicine

## 2022-05-20 ENCOUNTER — Ambulatory Visit (HOSPITAL_COMMUNITY): Admission: RE | Admit: 2022-05-20 | Payer: Medicare Other | Source: Home / Self Care | Admitting: Vascular Surgery

## 2022-05-20 ENCOUNTER — Encounter (HOSPITAL_COMMUNITY): Payer: Self-pay | Admitting: Vascular Surgery

## 2022-05-20 DIAGNOSIS — F17200 Nicotine dependence, unspecified, uncomplicated: Secondary | ICD-10-CM | POA: Diagnosis not present

## 2022-05-20 DIAGNOSIS — I63231 Cerebral infarction due to unspecified occlusion or stenosis of right carotid arteries: Secondary | ICD-10-CM | POA: Diagnosis not present

## 2022-05-20 DIAGNOSIS — D72829 Elevated white blood cell count, unspecified: Secondary | ICD-10-CM | POA: Diagnosis not present

## 2022-05-20 DIAGNOSIS — E782 Mixed hyperlipidemia: Secondary | ICD-10-CM | POA: Diagnosis not present

## 2022-05-20 DIAGNOSIS — I10 Essential (primary) hypertension: Secondary | ICD-10-CM

## 2022-05-20 DIAGNOSIS — I6521 Occlusion and stenosis of right carotid artery: Secondary | ICD-10-CM

## 2022-05-20 HISTORY — PX: CAROTID ANGIOGRAPHY: CATH118230

## 2022-05-20 HISTORY — PX: AORTIC ARCH ANGIOGRAPHY: CATH118224

## 2022-05-20 LAB — CBC
HCT: 42.3 % (ref 36.0–46.0)
Hemoglobin: 14.4 g/dL (ref 12.0–15.0)
MCH: 29 pg (ref 26.0–34.0)
MCHC: 34 g/dL (ref 30.0–36.0)
MCV: 85.1 fL (ref 80.0–100.0)
Platelets: 274 10*3/uL (ref 150–400)
RBC: 4.97 MIL/uL (ref 3.87–5.11)
RDW: 12.9 % (ref 11.5–15.5)
WBC: 12.8 10*3/uL — ABNORMAL HIGH (ref 4.0–10.5)
nRBC: 0 % (ref 0.0–0.2)

## 2022-05-20 LAB — BASIC METABOLIC PANEL
Anion gap: 13 (ref 5–15)
BUN: 10 mg/dL (ref 6–20)
CO2: 20 mmol/L — ABNORMAL LOW (ref 22–32)
Calcium: 9.2 mg/dL (ref 8.9–10.3)
Chloride: 107 mmol/L (ref 98–111)
Creatinine, Ser: 0.57 mg/dL (ref 0.44–1.00)
GFR, Estimated: >60 mL/min (ref >60)
Glucose, Bld: 128 mg/dL — ABNORMAL HIGH (ref 70–99)
Potassium: 3.5 mmol/L (ref 3.5–5.1)
Sodium: 140 mmol/L (ref 135–145)

## 2022-05-20 LAB — VITAMIN B1: Vitamin B1 (Thiamine): 146.8 nmol/L (ref 66.5–200.0)

## 2022-05-20 SURGERY — CAROTID ANGIOGRAPHY
Anesthesia: LOCAL

## 2022-05-20 MED ORDER — SODIUM CHLORIDE 0.9 % IV SOLN: INTRAVENOUS | Status: DC

## 2022-05-20 MED ORDER — OXYCODONE HCL 5 MG PO TABS
5.0000 mg | ORAL_TABLET | ORAL | Status: DC | PRN
  Administered 2022-05-20 – 2022-05-21 (×2): 5 mg via ORAL
  Filled 2022-05-20 (×2): qty 1

## 2022-05-20 MED ORDER — HEPARIN SODIUM (PORCINE) 1000 UNIT/ML IJ SOLN
INTRAMUSCULAR | Status: DC | PRN
  Administered 2022-05-20: 5000 [IU] via INTRAVENOUS

## 2022-05-20 MED ORDER — ACETAMINOPHEN 325 MG PO TABS
ORAL_TABLET | ORAL | Status: AC
  Filled 2022-05-20: qty 2

## 2022-05-20 MED ORDER — ONDANSETRON HCL 4 MG/2ML IJ SOLN: 4.0000 mg | Freq: Four times a day (QID) | INTRAMUSCULAR | Status: DC | PRN

## 2022-05-20 MED ORDER — LABETALOL HCL 5 MG/ML IV SOLN: 10.0000 mg | INTRAVENOUS | Status: DC | PRN

## 2022-05-20 MED ORDER — FENTANYL CITRATE (PF) 100 MCG/2ML IJ SOLN
INTRAMUSCULAR | Status: DC | PRN
  Administered 2022-05-20: 50 ug via INTRAVENOUS

## 2022-05-20 MED ORDER — HYDRALAZINE HCL 20 MG/ML IJ SOLN: 5.0000 mg | INTRAMUSCULAR | Status: DC | PRN

## 2022-05-20 MED ORDER — MORPHINE SULFATE (PF) 2 MG/ML IV SOLN
2.0000 mg | INTRAVENOUS | Status: DC | PRN
  Administered 2022-05-20: 2 mg via INTRAVENOUS
  Filled 2022-05-20: qty 1

## 2022-05-20 MED ORDER — MIDAZOLAM HCL 2 MG/2ML IJ SOLN
INTRAMUSCULAR | Status: AC
  Filled 2022-05-20: qty 2

## 2022-05-20 MED ORDER — FENTANYL CITRATE (PF) 100 MCG/2ML IJ SOLN
INTRAMUSCULAR | Status: AC
  Filled 2022-05-20: qty 2

## 2022-05-20 MED ORDER — HEPARIN (PORCINE) IN NACL 1000-0.9 UT/500ML-% IV SOLN
INTRAVENOUS | Status: AC
  Filled 2022-05-20: qty 1000

## 2022-05-20 MED ORDER — SODIUM CHLORIDE 0.9 % WEIGHT BASED INFUSION
1.0000 mL/kg/h | INTRAVENOUS | Status: AC
  Administered 2022-05-20: 1 mL/kg/h via INTRAVENOUS

## 2022-05-20 MED ORDER — LIDOCAINE HCL (PF) 1 % IJ SOLN
INTRAMUSCULAR | Status: AC
  Filled 2022-05-20: qty 30

## 2022-05-20 MED ORDER — PANTOPRAZOLE SODIUM 40 MG PO TBEC
40.0000 mg | DELAYED_RELEASE_TABLET | Freq: Every day | ORAL | Status: DC
  Administered 2022-05-20 – 2022-05-21 (×2): 40 mg via ORAL
  Filled 2022-05-20 (×2): qty 1

## 2022-05-20 MED ORDER — IODIXANOL 320 MG/ML IV SOLN
INTRAVENOUS | Status: DC | PRN
  Administered 2022-05-20: 65 mL

## 2022-05-20 MED ORDER — HEPARIN (PORCINE) IN NACL 1000-0.9 UT/500ML-% IV SOLN
INTRAVENOUS | Status: DC | PRN
  Administered 2022-05-20 (×2): 500 mL

## 2022-05-20 MED ORDER — MIDAZOLAM HCL 2 MG/2ML IJ SOLN
INTRAMUSCULAR | Status: DC | PRN
  Administered 2022-05-20: 1 mg via INTRAVENOUS

## 2022-05-20 MED ORDER — LIDOCAINE HCL (PF) 1 % IJ SOLN
INTRAMUSCULAR | Status: DC | PRN
  Administered 2022-05-20: 15 mL

## 2022-05-20 MED ORDER — HEPARIN SODIUM (PORCINE) 1000 UNIT/ML IJ SOLN
INTRAMUSCULAR | Status: AC
  Filled 2022-05-20: qty 10

## 2022-05-20 SURGICAL SUPPLY — 15 items
CATH ANGIO 5F PIGTAIL 100CM (CATHETERS) ×1 IMPLANT
CATH ANGIO 5F SIM1 100CM (CATHETERS) ×1 IMPLANT
CATH INFINITI VERT 5FR 125CM (CATHETERS) ×1 IMPLANT
CATH OMNI FLUSH 5F 65CM (CATHETERS) IMPLANT
DEVICE CLOSURE MYNXGRIP 5F (Vascular Products) ×1 IMPLANT
GLIDEWIRE ADV .035X260CM (WIRE) ×1 IMPLANT
KIT MICROPUNCTURE NIT STIFF (SHEATH) ×1 IMPLANT
KIT PV (KITS) ×3 IMPLANT
SHEATH PINNACLE 5F 10CM (SHEATH) ×1 IMPLANT
STOPCOCK MORSE 400PSI 3WAY (MISCELLANEOUS) ×1 IMPLANT
SYR MEDRAD MARK V 150ML (SYRINGE) ×1 IMPLANT
TRANSDUCER W/STOPCOCK (MISCELLANEOUS) ×3 IMPLANT
TRAY PV CATH (CUSTOM PROCEDURE TRAY) ×3 IMPLANT
TUBING CIL FLEX 10 FLL-RA (TUBING) ×1 IMPLANT
WIRE BENTSON .035X145CM (WIRE) ×1 IMPLANT

## 2022-05-20 NOTE — Progress Notes (Addendum)
  Progress Note    05/20/2022 8:49 AM Hospital Day 1  Subjective:  Similar chest heaviness as compared to yesterday morning. Less severe. EKG obtained Complaining of generalized weakness since stroke    Vitals:   05/20/22 0329 05/20/22 0738  BP: (!) 138/52 (!) 147/59  Pulse: 61 62  Resp: 20 16  Temp: 98.6 F (37 C) 98 F (36.7 C)  SpO2: 94% 95%    Physical Exam:  General:  anxious  Lungs:  non labored Extremities:  moving all extremities equally [place] - able to answer all questions appropriately.   CBC    Component Value Date/Time   WBC 12.8 (H) 05/20/2022 0319   RBC 4.97 05/20/2022 0319   HGB 14.4 05/20/2022 0319   HCT 42.3 05/20/2022 0319   PLT 274 05/20/2022 0319   MCV 85.1 05/20/2022 0319   MCH 29.0 05/20/2022 0319   MCHC 34.0 05/20/2022 0319   RDW 12.9 05/20/2022 0319   LYMPHSABS 3.2 05/18/2022 0011   MONOABS 1.1 (H) 05/18/2022 0011   EOSABS 0.1 05/18/2022 0011   BASOSABS 0.1 05/18/2022 0011    BMET    Component Value Date/Time   NA 140 05/20/2022 0319   K 3.5 05/20/2022 0319   CL 107 05/20/2022 0319   CO2 20 (L) 05/20/2022 0319   GLUCOSE 128 (H) 05/20/2022 0319   BUN 10 05/20/2022 0319   CREATININE 0.57 05/20/2022 0319   CALCIUM 9.2 05/20/2022 0319   GFRNONAA >60 05/20/2022 0319   GFRAA  09/01/2009 1740    >60        The eGFR has been calculated using the MDRD equation. This calculation has not been validated in all clinical situations. eGFR's persistently <60 mL/min signify possible Chronic Kidney Disease.    INR No results found for: "INR"  No intake or output data in the 24 hours ending 05/20/22 0849    Assessment/Plan:  61 y.o. female admitted with stroke and possible right carotid artery dissection Hospital Day 2  EKG without abnormality  Carotid duplex reveals 80-99% right ICA stenosis and no evidence of dissection.    I had a long discussion with Diamond Adams regarding right ICA angiography in an effort to define perfusion at the  level of the skull base.  Should this be open, we discussed the role of TCAR.  If occluded, we will continue medical management. Being that there is a small stroke risk associated, I do not plan on performing angiography on the left carotid. After discussing the risk and benefits, Diamond Adams elected to proceed  Diamond Adams

## 2022-05-20 NOTE — Progress Notes (Addendum)
SITE AREA: right groin  LEVEL 1 noted distal to drsg and slightly under drsg to right groin  PRESSURE APPLIED FOR: approximately 15 minutes by Larna Daughters., Rn and Scott, RN  MANUAL: yes  PATIENT STATUS DURING PULL: stable, pain noted while applying pressure  POST PULL SITE:  LEVEL 0  POST PULL INSTRUCTIONS GIVEN: yes  POST PULL PULSES PRESENT: bilateral pedal pulses at +1  DRESSING APPLIED: gauze with tegaderm remains in place  BEDREST BEGINS @ 1105  COMMENTS:  Dr Virl Cagey to bedside at 1110, no new orders obtained

## 2022-05-20 NOTE — Progress Notes (Signed)
STROKE TEAM PROGRESS NOTE   SUBJECTIVE (INTERVAL HISTORY) Her daughter is at the bedside. Pt lying in bed, just back from cerebral angio with Dr. Virl Cagey.  Cerebral angiogram showed right ICA 90% severe stenosis.  Vascular surgery plan to have right TCAR next week.   OBJECTIVE Temp:  [97.7 F (36.5 C)-98.6 F (37 C)] 97.7 F (36.5 C) (06/28 1346) Pulse Rate:  [47-97] 49 (06/28 1500) Cardiac Rhythm: Sinus bradycardia (06/28 1343) Resp:  [10-24] 18 (06/28 1500) BP: (113-176)/(40-96) 130/53 (06/28 1500) SpO2:  [94 %-100 %] 97 % (06/28 1500)  No results for input(s): "GLUCAP" in the last 168 hours. Recent Labs  Lab 05/18/22 0011 05/18/22 0410 05/20/22 0319  NA 134* 136 140  K 3.1* 3.4* 3.5  CL 101 104 107  CO2 24 24 20*  GLUCOSE 107* 96 128*  BUN 12 10 10   CREATININE 0.57 0.56 0.57  CALCIUM 9.0 8.6* 9.2  MG 2.0  --   --    Recent Labs  Lab 05/18/22 0011 05/18/22 0410  AST 16 12*  ALT 19 16  ALKPHOS 89 75  BILITOT 0.4 0.6  PROT 7.4 6.2*  ALBUMIN 3.9 3.3*   Recent Labs  Lab 05/18/22 0011 05/18/22 0410 05/20/22 0319  WBC 17.9* 17.6* 12.8*  NEUTROABS 13.1*  --   --   HGB 15.6* 14.2 14.4  HCT 44.5 42.0 42.3  MCV 84.3 85.5 85.1  PLT 264 273 274   No results for input(s): "CKTOTAL", "CKMB", "CKMBINDEX", "TROPONINI" in the last 168 hours. No results for input(s): "LABPROT", "INR" in the last 72 hours. Recent Labs    05/17/22 2358  COLORURINE STRAW*  LABSPEC 1.005  PHURINE 7.0  GLUCOSEU NEGATIVE  HGBUR NEGATIVE  BILIRUBINUR NEGATIVE  KETONESUR NEGATIVE  PROTEINUR NEGATIVE  NITRITE NEGATIVE  LEUKOCYTESUR NEGATIVE       Component Value Date/Time   CHOL 138 05/18/2022 0410   TRIG 75 05/18/2022 0410   HDL 40 (L) 05/18/2022 0410   CHOLHDL 3.5 05/18/2022 0410   VLDL 15 05/18/2022 0410   LDLCALC 83 05/18/2022 0410   Lab Results  Component Value Date   HGBA1C 5.4 05/18/2022      Component Value Date/Time   LABOPIA NONE DETECTED 05/18/2022 1353    COCAINSCRNUR NONE DETECTED 05/18/2022 1353   LABBENZ NONE DETECTED 05/18/2022 1353   AMPHETMU NONE DETECTED 05/18/2022 1353   THCU NONE DETECTED 05/18/2022 1353   LABBARB NONE DETECTED 05/18/2022 1353    Recent Labs  Lab 05/18/22 0410  ETH <10    I have personally reviewed the radiological images below and agree with the radiology interpretations.  PERIPHERAL VASCULAR CATHETERIZATION  Result Date: 05/20/2022 Images from the original result were not included.   Patient name: ADANA MARIK   MRN: [id number]        DOB: 08/23/1961          Sex: female  05/20/2022 Pre-operative Diagnosis: Symptomatic right ICA stenosis Post-operative diagnosis:  Same Surgeon:  Broadus John, MD Procedure Performed: 1.  Ultrasound-guided micropuncture access of the right common femoral artery 2.  Aortic arch angiogram 3.  Selective cannulation of the right common carotid artery 4.  Carotid angiogram 5.  Device assisted closure-Mynx 6.  Moderate sedation-30 minutes  Indications: Patient is a 61 year old female who presented with strokelike symptoms 2 days ago with left-sided weakness.  Imaging demonstrated severe ostial stenosis of the right internal carotid artery with concern for distal occlusion.  After discussing the risk and benefits of cerebral  angiogram in an effort to define patency for possible transcarotid artery revascularization, Bennie elected to proceed.   Findings: Aortic arch angiogram: Type III aortic arch.  No flow-limiting stenosis appreciated in first-order arch branches.  Widely patent bilateral subclavian arteries, bilateral vertebral arteries. Greater than 90% stenosis appreciated at the ostia of the right internal carotid artery.  The internal carotid arteries patent throughout.  The anterior cerebral artery is not appreciated.  Middle cerebral artery fills.              Procedure:  The patient was identified in the holding area and taken to room 8.  The patient was then placed supine on the table and  prepped and draped in the usual sterile fashion.  A time out was called.  Ultrasound was used to evaluate the right common femoral artery.  It was patent .  A digital ultrasound image was acquired.  A micropuncture needle was used to access the right common femoral artery under ultrasound guidance.  An 018 wire was advanced without resistance and a micropuncture sheath was placed.  The patient was heparinized and a 0.035 wire was advanced into the ascending aorta followed by a flush pigtail catheter.  Arch angiogram followed.  See results above.  Next, a vertebral catheter was used to cannulate the innominate artery and further used to cannulate the right common carotid artery.  Carotid artery angiography followed in both anterior and lateral views to evaluate ostial stenosis as well as cerebral filling.  See results above. The groin was closed using a minx device.  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  The right ICA is patent, filling the right middle cerebral artery.  The right anterior communicating artery was not appreciated.    Cassandria Santee, MD [facility] Office: [phone]   VAS US CAROTID  Result Date: 05/19/2022 Carotid Arterial Duplex Study Patient Name:  VIRGIA KELNER  Date of Exam:   05/18/2022 Medical Rec #: [id number]      Accession #:    [id number] Date of Birth: [birth year]      Patient Gender: F Patient Age:   35 years Exam Location:  [facility] Procedure:      VAS US CAROTID Referring Phys: Monica Martinez --------------------------------------------------------------------------------  Indications:       Question right ICA dissection and possible occlusion seen on                    CTA. Risk Factors:      Hypertension. Limitations        Today's exam was limited due to the patient's respiratory                    variation and patient movement, patient talking, ambient room                    light. Comparison  Study:  No prior study Performing Technologist: Maudry Mayhew MHA, RDMS, RVT, RDCS  Examination Guidelines: A complete evaluation includes B-mode imaging, spectral Doppler, color Doppler, and power Doppler as needed of all accessible portions of each vessel. Bilateral testing is considered an integral part of a complete examination. Limited examinations for reoccurring indications may be performed as noted.  Right Carotid Findings: +----------+-------+--------+--------+------------------------+----------------+           PSV    EDV cm/sStenosisPlaque Description      Comments  cm/s                                                            +----------+-------+--------+--------+------------------------+----------------+ CCA Prox  47     4                                                        +----------+-------+--------+--------+------------------------+----------------+ CCA Distal40     7                                                        +----------+-------+--------+--------+------------------------+----------------+ ICA Prox  360    94      80-99%  heterogenous and                                                          irregular                                +----------+-------+--------+--------+------------------------+----------------+ ICA Mid   77                                             thumped waveform +----------+-------+--------+--------+------------------------+----------------+ ICA Distal49     8                                                        +----------+-------+--------+--------+------------------------+----------------+ ECA       144    23                                                       +----------+-------+--------+--------+------------------------+----------------+ +----------+--------+-------+----------------+-------------------+           PSV cm/sEDV cmsDescribe        Arm Pressure  (mmHG) +----------+--------+-------+----------------+-------------------+ [id number]            Multiphasic, WNL                    +----------+--------+-------+----------------+-------------------+ +---------+--------+--+--------+--+---------+ VertebralPSV cm/s84EDV cm/s17Antegrade +---------+--------+--+--------+--+---------+  Left Carotid Findings: +----------+--------+--------+--------+-----------------------+--------+           PSV cm/sEDV cm/sStenosisPlaque Description     Comments +----------+--------+--------+--------+-----------------------+--------+ CCA Prox  103     33                                              +----------+--------+--------+--------+-----------------------+--------+  CCA Distal109     29                                              +----------+--------+--------+--------+-----------------------+--------+ ICA Prox  274     75      60-79%  smooth and heterogenous         +----------+--------+--------+--------+-----------------------+--------+ ICA Mid   165     24                                              +----------+--------+--------+--------+-----------------------+--------+ ICA Distal154     42                                              +----------+--------+--------+--------+-----------------------+--------+ ECA       129     12                                              +----------+--------+--------+--------+-----------------------+--------+ +----------+--------+--------+----------------+-------------------+           PSV cm/sEDV cm/sDescribe        Arm Pressure (mmHG) +----------+--------+--------+----------------+-------------------+ Subclavian171             Multiphasic, WNL                    +----------+--------+--------+----------------+-------------------+ +---------+--------+--+--------+--+---------+ VertebralPSV cm/s72EDV cm/s20Antegrade +---------+--------+--+--------+--+---------+    Summary: Right Carotid: Velocities in the right ICA are consistent with a 80-99% stenosis                by peak systolic velocities and plaque morphology. No obvious                evidence of dissection. Left Carotid: Velocities in the left ICA are consistent with a 60-79% stenosis,               however this may be compensatory flow. Vertebrals:  Bilateral vertebral arteries demonstrate antegrade flow. Subclavians: Normal flow hemodynamics were seen in bilateral subclavian              arteries. *See table(s) above for measurements and observations.  Electronically signed by Harold Barban MD on 05/19/2022 at 10:47:02 PM.    Final    ECHOCARDIOGRAM COMPLETE  Result Date: 05/19/2022    ECHOCARDIOGRAM REPORT   Patient Name:   ALEESIA HENNEY Date of Exam: 05/19/2022 Medical Rec #:  [id number]     Height:       58.0 in Accession #:    [id number]    Weight:       132.0 lb Date of Birth:  February 23, 1961     BSA:          1.526 m Patient Age:    66 years      BP:           142/60 mmHg Patient Gender: F             HR:           60 bpm. Exam Location:  Inpatient Procedure: 2D Echo, Cardiac Doppler and Color Doppler Indications:    Stroke  History:        Patient has no prior history of Echocardiogram examinations.                 Risk Factors:Hypertension.  Sonographer:    Joette Catching RCS Referring Phys: [id number] ASIA B [place]  Sonographer Comments: Suboptimal parasternal window. IMPRESSIONS  1. Left ventricular ejection fraction, by estimation, is 60 to 65%. The left ventricle has normal function. The left ventricle has no regional wall motion abnormalities. Left ventricular diastolic parameters were normal.  2. Right ventricular systolic function is normal. The right ventricular size is normal.  3. The mitral valve is normal in structure. No evidence of mitral valve regurgitation. No evidence of mitral stenosis.  4. The aortic valve is normal in structure. Aortic valve regurgitation is not visualized. No aortic  stenosis is present.  5. The inferior vena cava is normal in size with greater than 50% respiratory variability, suggesting right atrial pressure of 3 mmHg. FINDINGS  Left Ventricle: Left ventricular ejection fraction, by estimation, is 60 to 65%. The left ventricle has normal function. The left ventricle has no regional wall motion abnormalities. The left ventricular internal cavity size was normal in size. There is  no left ventricular hypertrophy. Left ventricular diastolic parameters were normal. Right Ventricle: The right ventricular size is normal. No increase in right ventricular wall thickness. Right ventricular systolic function is normal. Left Atrium: Left atrial size was normal in size. Right Atrium: Right atrial size was normal in size. Pericardium: There is no evidence of pericardial effusion. Mitral Valve: The mitral valve is normal in structure. No evidence of mitral valve regurgitation. No evidence of mitral valve stenosis. Tricuspid Valve: The tricuspid valve is normal in structure. Tricuspid valve regurgitation is not demonstrated. No evidence of tricuspid stenosis. Aortic Valve: The aortic valve is normal in structure. Aortic valve regurgitation is not visualized. No aortic stenosis is present. Aortic valve mean gradient measures 8.0 mmHg. Aortic valve peak gradient measures 16.5 mmHg. Aortic valve area, by VTI measures 1.28 cm. Pulmonic Valve: The pulmonic valve was normal in structure. Pulmonic valve regurgitation is not visualized. No evidence of pulmonic stenosis. Aorta: The aortic root is normal in size and structure. Venous: The inferior vena cava is normal in size with greater than 50% respiratory variability, suggesting right atrial pressure of 3 mmHg. IAS/Shunts: No atrial level shunt detected by color flow Doppler.  LEFT VENTRICLE PLAX 2D LVIDd:         3.70 cm   Diastology LVIDs:         2.00 cm   LV e' medial:    7.95 cm/s LV PW:         0.90 cm   LV E/e' medial:  12.0 LV IVS:         0.70 cm   LV e' lateral:   12.40 cm/s LVOT diam:     1.60 cm   LV E/e' lateral: 7.7 LV SV:         58 LV SV Index:   38 LVOT Area:     2.01 cm  RIGHT VENTRICLE             IVC RV Basal diam:  3.10 cm     IVC diam: 1.60 cm RV Mid diam:    1.70 cm RV S prime:     14.30 cm/s TAPSE (M-mode): 2.5 cm LEFT ATRIUM  Index        RIGHT ATRIUM          Index LA diam:        2.60 cm 1.70 cm/m   RA Area:     9.36 cm LA Vol (A2C):   41.5 ml 27.19 ml/m  RA Volume:   16.00 ml 10.48 ml/m LA Vol (A4C):   20.9 ml 13.69 ml/m LA Biplane Vol: 29.0 ml 19.00 ml/m  AORTIC VALVE                     PULMONIC VALVE AV Area (Vmax):    1.42 cm      PV Vmax:       1.43 m/s AV Area (Vmean):   1.36 cm      PV Peak grad:  8.2 mmHg AV Area (VTI):     1.28 cm AV Vmax:           203.00 cm/s AV Vmean:          131.000 cm/s AV VTI:            0.455 m AV Peak Grad:      16.5 mmHg AV Mean Grad:      8.0 mmHg LVOT Vmax:         143.00 cm/s LVOT Vmean:        88.800 cm/s LVOT VTI:          0.289 m LVOT/AV VTI ratio: 0.64  AORTA Ao Root diam: 2.70 cm Ao Asc diam:  2.50 cm MITRAL VALVE MV Area (PHT): 3.56 cm    SHUNTS MV Decel Time: 213 msec    Systemic VTI:  0.29 m MV E velocity: 95.40 cm/s  Systemic Diam: 1.60 cm MV A velocity: 90.70 cm/s MV E/A ratio:  1.05 Mihai Croitoru MD Electronically signed by Sanda Klein MD Signature Date/Time: 05/19/2022/12:35:28 PM    Final    EEG adult  Result Date: 05/18/2022 Lora Havens, MD     05/18/2022  8:21 PM Patient Name: JENNEA RAGER MRN: [id number] Epilepsy Attending: Lora Havens Referring Physician/Provider: Rolla Plate, DO Date: 05/18/2022 Duration: 25.48 mins Patient history: 36 YOF presetned with compalint of intermittent numbness/tinlging on left side, weakness all over, left facial symptoms, fall/dizziness symptoms. EEG to evaluate for seizure. Level of alertness: Awake AEDs during EEG study: None Technical aspects: This EEG study was done with scalp electrodes  positioned according to the 10-20 International system of electrode placement. Electrical activity was acquired at a sampling rate of 500Hz  and reviewed with a high frequency filter of 70Hz  and a low frequency filter of 1Hz . EEG data were recorded continuously and digitally stored. Description: The posterior dominant rhythm consists of 9 Hz activity of moderate voltage (25-35 uV) seen predominantly in posterior head regions, symmetric and reactive to eye opening and eye closing. EEG showed intermittent 3 to 6 Hz theta-delta slowing in right temporal region. Physiologic photic driving was not seen during photic stimulation.  Hyperventilation was not performed.   ABNORMALITY - Intermittent slow, right temporal region. IMPRESSION: This study is suggestive of cortical dysfunction arising from right temporal region, likely secondary to underlying structural abnormality. No seizures or epileptiform discharges were seen throughout the recording. Lora Havens   MR BRAIN W WO CONTRAST  Result Date: 05/18/2022 CLINICAL DATA:  Dizziness with numbness in all 4 extremities.  Fall. EXAM: MRI HEAD WITHOUT AND WITH CONTRAST TECHNIQUE: Multiplanar, multiecho pulse sequences of the brain and surrounding  structures were obtained without and with intravenous contrast. CONTRAST:  43m GADAVIST GADOBUTROL 1 MMOL/ML IV SOLN COMPARISON:  Head CT and CTA from earlier today FINDINGS: Brain: Patchy acute infarcts in the right cerebral hemisphere, most confluent in the parietal cortex and subcortical white matter but small infarcts also seen roughly aligned along the right MCA/ACA watershed, in the right periatrial white matter, and near the anterior commissure. Mild mineralization at the parietal cortex infarct which is also enhancing. Some of the watershed type infarcts in the right corona radiata are enhancing as well. No hydrocephalus or masslike finding. No prior gray matter infarcts. Vascular: Absent right ICA flow void in the  neck and skull base, known from prior CTA. Skull and upper cervical spine: Normal marrow signal. Sinuses/Orbits: Negative IMPRESSION: 1. 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 2. Known poor or absent flow in the right ICA from prior CTA. Electronically Signed   By: JJorje GuildM.D.   On: 05/18/2022 07:34   CT ANGIO HEAD NECK W WO CM  Result Date: 05/18/2022 CLINICAL DATA:  Dizziness and fall EXAM: CT ANGIOGRAPHY HEAD AND NECK TECHNIQUE: Multidetector CT imaging of the head and neck was performed using the standard protocol during bolus administration of intravenous contrast. Multiplanar CT image reconstructions and MIPs were obtained to evaluate the vascular anatomy. Carotid stenosis measurements (when applicable) are obtained utilizing NASCET criteria, using the distal internal carotid diameter as the denominator. RADIATION DOSE REDUCTION: This exam was performed according to the departmental dose-optimization program which includes automated exposure control, adjustment of the mA and/or kV according to patient size and/or use of iterative reconstruction technique. CONTRAST:  73mOMNIPAQUE IOHEXOL 350 MG/ML SOLN COMPARISON:  None Available. FINDINGS: CTA NECK FINDINGS SKELETON: There is no bony spinal canal stenosis. No lytic or blastic lesion. OTHER NECK: Normal pharynx, larynx and major salivary glands. No cervical lymphadenopathy. Unremarkable thyroid gland. UPPER CHEST: No pneumothorax or pleural effusion. No nodules or masses. AORTIC ARCH: There is no calcific atherosclerosis of the aortic arch. There is no aneurysm, dissection or hemodynamically significant stenosis of the visualized portion of the aorta. Conventional 3 vessel aortic branching pattern. The visualized proximal subclavian arteries are widely patent. RIGHT CAROTID SYSTEM: There is severe stenosis of the right internal carotid artery along its  entire length, progressively worsening until it becomes occluded proximal to the skull base. LEFT CAROTID SYSTEM: No dissection, occlusion or aneurysm. Mild atherosclerotic calcification at the carotid bifurcation without hemodynamically significant stenosis. VERTEBRAL ARTERIES: Codominant configuration. Both origins are clearly patent. There is no dissection, occlusion or flow-limiting stenosis to the skull base (V1-V3 segments). CTA HEAD FINDINGS POSTERIOR CIRCULATION: --Vertebral arteries: Normal V4 segments. --Inferior cerebellar arteries: Normal. --Basilar artery: Normal. --Superior cerebellar arteries: Normal. --Posterior cerebral arteries (PCA): Normal. ANTERIOR CIRCULATION: --Intracranial internal carotid arteries: There is reconstitution of the right ICA at the distal petrous segment, likely due to collateral flow across the circle-of-Willis. --Anterior cerebral arteries (ACA): Normal. Both A1 segments are present. Patent anterior communicating artery (a-comm). --Middle cerebral arteries (MCA): Normal. VENOUS SINUSES: As permitted by contrast timing, patent. ANATOMIC VARIANTS: Fetal origin of the left posterior cerebral artery. Review of the MIP images confirms the above findings. IMPRESSION: 1. 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 2. Reconstitution of the right ICA at the distal petrous segment, likely due to collateral flow across the circle-of-Willis.  3. No intracranial arterial occlusion or hemodynamically significant stenosis. Critical Value/emergent results were called by telephone at the time of interpretation on 05/18/2022 at 3:15 am to provider [facility] , who verbally acknowledged these results. Electronically Signed   By: Ulyses Jarred M.D.   On: 05/18/2022 03:16   CT Head Wo Contrast  Result Date: 05/18/2022 CLINICAL DATA:  Altered mental status and dizziness EXAM: CT HEAD WITHOUT CONTRAST  TECHNIQUE: Contiguous axial images were obtained from the base of the skull through the vertex without intravenous contrast. RADIATION DOSE REDUCTION: This exam was performed according to the departmental dose-optimization program which includes automated exposure control, adjustment of the mA and/or kV according to patient size and/or use of iterative reconstruction technique. COMPARISON:  04/25/2022 FINDINGS: Brain: There is wedge-shaped area of decreased attenuation identified in the right posterior parietal lobe consistent with infarct in evolution. No other focal area of ischemia is noted. No hemorrhage is seen. Vascular: No hyperdense vessel or unexpected calcification. Skull: Normal. Negative for fracture or focal lesion. Sinuses/Orbits: No acute finding. Other: None. IMPRESSION: Wedge-shaped area in the right posterior parietal lobe consistent with an evolving infarct. This appears acute to subacute in nature. Electronically Signed   By: Inez Catalina M.D.   On: 05/18/2022 00:52   DG Chest Portable 1 View  Result Date: 05/18/2022 CLINICAL DATA:  Recent fall EXAM: PORTABLE CHEST 1 VIEW COMPARISON:  02/23/2014 FINDINGS: The heart size and mediastinal contours are within normal limits. Both lungs are clear. The visualized skeletal structures are unremarkable. IMPRESSION: No active disease. Electronically Signed   By: Inez Catalina M.D.   On: 05/18/2022 00:50   CT Head Wo Contrast  Result Date: 04/25/2022 CLINICAL DATA:  Headache dizziness EXAM: CT HEAD WITHOUT CONTRAST TECHNIQUE: Contiguous axial images were obtained from the base of the skull through the vertex without intravenous contrast. RADIATION DOSE REDUCTION: This exam was performed according to the departmental dose-optimization program which includes automated exposure control, adjustment of the mA and/or kV according to patient size and/or use of iterative reconstruction technique. COMPARISON:  CT brain 06/25/2009 FINDINGS: Brain: No evidence of  acute infarction, hemorrhage, hydrocephalus, extra-axial collection or mass lesion/mass effect. Vascular: No hyperdense vessel or unexpected calcification. Mild carotid vascular calcification Skull: Normal. Negative for fracture or focal lesion. Sinuses/Orbits: No acute finding. Other: None IMPRESSION: Negative non contrasted CT appearance of the brain Electronically Signed   By: Donavan Foil M.D.   On: 04/25/2022 22:20     PHYSICAL EXAM  Temp:  [97.7 F (36.5 C)-98.6 F (37 C)] 97.7 F (36.5 C) (06/28 1346) Pulse Rate:  [47-97] 49 (06/28 1500) Resp:  [10-24] 18 (06/28 1500) BP: (113-176)/(40-96) 130/53 (06/28 1500) SpO2:  [94 %-100 %] 97 % (06/28 1500)  General - Well nourished, well developed, in no apparent distress.  Ophthalmologic - fundi not visualized due to noncooperation.  Cardiovascular - Regular rhythm and rate.  Mental Status -  Level of arousal and orientation to time, place, and person were intact. Language including expression, naming, repetition, comprehension was assessed and found intact. Fund of Knowledge was assessed and was intact.  Cranial Nerves II - XII - II - Visual field intact OU. III, IV, VI - Extraocular movements intact. V - Facial sensation intact bilaterally. VII - mild right nasolabial fold flattening. VIII - Hearing & vestibular intact bilaterally. X - Palate elevates symmetrically. XI - Chin turning & shoulder shrug intact bilaterally. XII - Tongue protrusion intact.  Motor Strength - The patient's strength  was normal in all extremities and pronator drift was absent.  Bulk was normal and fasciculations were absent.   Motor Tone - Muscle tone was assessed at the neck and appendages and was normal.  Reflexes - The patient's reflexes were symmetrical in all extremities and she had no pathological reflexes.  Sensory - Light touch, temperature/pinprick were assessed and were symmetrical.    Coordination - The patient had normal movements in the  hands and feet with no ataxia or dysmetria.  Tremor was absent.  Gait and Station - deferred.   ASSESSMENT/PLAN Ms. TARAYA STEWARD is a 61 y.o. female with history of hypertension, smoker admitted for generalized weakness for 2 weeks, acute onset left-sided weakness, dizziness and blurred vision. No tPA given due to outside window.    Stroke:  right MCA scattered infarcts, largest at right frontal parietal region, likely artery to artery embolism  from right ICA high-grade stenosis versus occlusion CT head 6/3 no acute finding CT head 6/26 right frontal parietal subacute infarct CT head and neck right ICA high-grade stenosis versus occlusion, concerning for dissection MRI right MCA scattered infarcts largest at the right frontoparietal Carotid Doppler right ICA 80 to 99% stenosis, left ICA 60 to 79% stenosis. Cerebral angio right ICA 90% severe stenosis 2D Echo EF 60 to 65% LDL 83 HgbA1c 5.4 UDS negative ESR 32 CRP 2.6 Heparin subcu for VTE prophylaxis No antithrombotic prior to admission, now on aspirin 325 mg daily and clopidogrel 75 mg daily. Further regimen per [facility]VS Patient counseled to be compliant with her antithrombotic medications Ongoing aggressive stroke risk factor management Therapy recommendations: CIR Disposition: Pending  Carotid stenosis versus occlusion CT head and neck right ICA high-grade stenosis versus occlusion, concerning for dissection Carotid Doppler right ICA 80 to 99% stenosis, left ICA 60 to 79% stenosis. Carotid angiogram showed right ICA 90% stenosis VVS Dr. Carlis Abbott on board, plan for right TCAR 05/28/22.   Hypertension Stable Avoid low BP BP goal 130-160 before carotid intervention Long term BP goal normotensive  Hyperlipidemia Home meds: Pravastatin 40 LDL 83, goal < 70 Now on Crestor 20 Continue statin at discharge  Tobacco abuse Current smoker Smoking cessation counseling provided Pt is willing to quit  Other Stroke Risk  Factors   Other Active Problems Leukocytosis, improving, WBC 23.6-> 17.6->12.8  Hospital day # 2  Neurology will sign off. Please call with questions. Pt will follow up with stroke clinic NP at [facility] in about 4 weeks. Thanks for the consult.   Rosalin Hawking, MD PhD Stroke Neurology 05/20/2022 3:34 PM    To contact Stroke Continuity provider, please refer to [email or web address] After hours, contact General Neurology

## 2022-05-20 NOTE — Assessment & Plan Note (Signed)
Goal SBP between 130 and 160 prior to carotid intervention Currently holding lisinopril/HCTZ and metoprolol at discharge - follow outpatient and in CIR for resumption Avoid hypotension

## 2022-05-20 NOTE — Op Note (Signed)
    Patient name: Diamond Adams MRN: [id number] DOB: 06-Nov-1961 Sex: female  05/20/2022 Pre-operative Diagnosis: Symptomatic right ICA stenosis Post-operative diagnosis:  Same Surgeon:  Broadus John, MD Procedure Performed: 1.  Ultrasound-guided micropuncture access of the right common femoral artery 2.  Aortic arch angiogram 3.  Selective cannulation of the right common carotid artery  4.  Carotid angiogram 5.  Device assisted closure-Mynx 6.  Moderate sedation-30 minutes  Indications: Patient is a 61 year old female who presented with strokelike symptoms 2 days ago with left-sided weakness.  Imaging demonstrated severe ostial stenosis of the right internal carotid artery with concern for distal occlusion.  After discussing the risk and benefits of cerebral angiogram in an effort to define patency for possible transcarotid artery revascularization, [name] elected to proceed.   Findings:  Aortic arch angiogram: Type III aortic arch.  No flow-limiting stenosis appreciated in first-order arch branches.  Widely patent bilateral subclavian arteries, bilateral vertebral arteries. Greater than 90% stenosis appreciated at the ostia of the right internal carotid artery.  The internal carotid arteries patent throughout.  The anterior cerebral artery is not appreciated.  Middle cerebral artery fills.    Procedure:  The patient was identified in the holding area and taken to room 8.  The patient was then placed supine on the table and prepped and draped in the usual sterile fashion.  A time out was called.  Ultrasound was used to evaluate the right common femoral artery.  It was patent .  A digital ultrasound image was acquired.  A micropuncture needle was used to access the right common femoral artery under ultrasound guidance.  An 018 wire was advanced without resistance and a micropuncture sheath was placed.  The patient was heparinized and a 0.035 wire was advanced into the ascending aorta followed by  a flush pigtail catheter.  Arch angiogram followed.  See results above.  Next, a vertebral catheter was used to cannulate the innominate artery and further used to cannulate the right common carotid artery.  Carotid artery angiography followed in both anterior and lateral views to evaluate ostial stenosis as well as cerebral filling.  See results above. The groin was closed using a minx device.  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  The right ICA is patent, filling the right middle cerebral artery.  The right anterior communicating artery was not appreciated.    Cassandria Santee, MD [facility] [place] Office: [phone]

## 2022-05-20 NOTE — Progress Notes (Signed)
Inpatient Rehab Admissions Coordinator:    I do not yet have insurance auth or a CIR bed for this pt. I will continue to follow for potential admit pending medical readiness, insurance auth, and bed availability.   Clemens Catholic, [place], [place] Admissions Coordinator  [phone] ([place]) [phone] (office)

## 2022-05-20 NOTE — Progress Notes (Signed)
PT Cancellation Note  Patient Details Name: Diamond Adams MRN: [id number] DOB: 17-Feb-1961   Cancelled Treatment:    Reason Eval/Treat Not Completed: Patient at procedure or test/unavailable.  Pt gone for a R ICA angiography.  Will try to see her later as able and as appropriate. 05/20/2022  Ginger Carne., PT Acute Rehabilitation Services [phone]  (pager) [phone]  (office)   Tessie Fass Enora Trillo 05/20/2022, 9:47 AM

## 2022-05-20 NOTE — Progress Notes (Signed)
OTF for Angio.

## 2022-05-20 NOTE — Progress Notes (Signed)
Inpatient Rehabilitation Admissions Coordinator   Dr Florene Glen was notified of request by Bernadene Bell MD for [place] for peer to peer to be completed by 11 am 6/29 for possible CIR admit.   Danne Baxter, RN, MSN Rehab Admissions Coordinator [phone] 05/20/2022 3:46 PM

## 2022-05-20 NOTE — Progress Notes (Signed)
PROGRESS NOTE    Diamond Adams  TKP:[id number] DOB: 05-12-1961 DOA: 05/17/2022 PCP: Glenda Chroman, MD  Chief Complaint  Patient presents with   Weakness    Brief Narrative:  This 61 years old female with PMH significant for anxiety disorder, depression, fibromyalgia, hypertension presents in the  AP ED with c/o: generalized weakness x 2 weeks and subsequent fall.  Patient reports intermittent episodes of bilateral upper and lower extremity weakness with paresthesia. She also reports bilateral blurred vision.  All her symptoms returned to baseline.  CT head revealed Jilliane Kazanjian wedge-shaped area in the right posterior parietal lobe consistent with evolving infarct.  Patient was seen by telemetry neurology, recommended CVA work-up.  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  Vascular surgery was consulted recommended transfer to [facility] for further evaluation.      Assessment & Plan:   Principal Problem:   CVA (cerebral vascular accident) [facility]) Active Problems:   Carotid artery stenosis   Essential hypertension   Generalized weakness   Hypokalemia   Depression   HLD (hyperlipidemia)   Tobacco use disorder   Leukocytosis   Assessment and Plan: * CVA (cerebral vascular accident) ([place]) MRI brain with acute and subacute infarcts in R MCA distribution, largest area being subacute infarct in R parietal cortex CTA head/neck with diffuse severe narrowing of R internal carotid artery with occlusion proximal to skull base, likely carotid dissection - reconstitution of R ICA at distal petrous segment, likely due to collateral flow across the circle of willis  Carotid US with 80-99% stenosis in R ICA, velocities in L ICA c/w 60-79% stenosis S/p angiogram today, which showed >90% stenosis appreciated at the osteo of the right internal carotid artery Echo with EF 60-65% (see report) Planning  for TCAR with vascular 7/6 Neuro c/s appreciate recs -> DAPT, crestor 20, BP goal 130-160 prior to carotid intervention LDL 83, a1c 5.4 PT/OT recommending CIR SLP recommending outpatient SLP  Carotid artery stenosis Per vascular Planning TCAR 7/6  Essential hypertension Goal SBP between 130 and 160 prior to carotid intervention Avoid hypotension  Generalized weakness PT/OT  Hypokalemia -Replace and recheck  Depression -Continue prozac  HLD (hyperlipidemia) -Continue statin  Tobacco use disorder - Advised on the importance of cessation  Leukocytosis Mild, downtrending, follow CXR without acute disease Afebrile Blood cx no growth  UA not c/w UTI Follow      DVT prophylaxis: heparin Code Status: full Family Communication: none Disposition:   Status is: Inpatient Remains inpatient appropriate because: pending safe d/c plan   Consultants:  Vascular neurology  Procedures:  Procedure Performed: 1.  Ultrasound-guided micropuncture access of the right common femoral artery 2.  Aortic arch angiogram 3.  Selective cannulation of the right common carotid artery  4.  Carotid angiogram 5.  Device assisted closure-Mynx 6.  Moderate sedation-30 minutes  IMPRESSIONS     1. Left ventricular ejection fraction, by estimation, is 60 to 65%. The  left ventricle has normal function. The left ventricle has no regional  wall motion abnormalities. Left ventricular diastolic parameters were  normal.   2. Right ventricular systolic function is normal. The right ventricular  size is normal.   3. The mitral valve is normal in structure. No evidence of mitral valve  regurgitation. No evidence of mitral stenosis.   4. The aortic valve is normal in structure. Aortic valve regurgitation is  not visualized. No aortic  stenosis is present.   5. The inferior vena cava is normal in size with greater than 50%  respiratory variability, suggesting right atrial pressure of 3  mmHg.  Carotid US Summary:  Right Carotid: Velocities in the right ICA are consistent with Renardo Cheatum 80-99%  stenosis                 by peak systolic velocities and plaque morphology. No  obvious                 evidence of dissection.   Left Carotid: Velocities in the left ICA are consistent with Elynore Dolinski 60-79%  stenosis,                however this may be compensatory flow.   Vertebrals:  Bilateral vertebral arteries demonstrate antegrade flow.  Subclavians: Normal flow hemodynamics were seen in bilateral subclavian               arteries.   Antimicrobials:  Anti-infectives (From admission, onward)    None       Subjective: No complaints  Objective: Vitals:   05/20/22 1500 05/20/22 1600 05/20/22 1800 05/20/22 2038  BP: (!) 130/53 100/71  (!) 111/51  Pulse: (!) 49 (!) 55  60  Resp: '18 14  20  '$ Temp:    97.9 F (36.6 C)  TempSrc:    Oral  SpO2: 97% 100%  97%  Weight:      Height:   '4\' 10"'$  (1.473 m)     Intake/Output Summary (Last 24 hours) at 05/20/2022 2056 Last data filed at 05/20/2022 1800 Gross per 24 hour  Intake 916.14 ml  Output 200 ml  Net 716.14 ml   Filed Weights   05/17/22 2334  Weight: 59.9 kg    Examination:  General exam: Appears calm and comfortable - lying flat after angiogram Respiratory system: unlabored Cardiovascular system: RRR Gastrointestinal system: Abdomen is nondistended, soft and nontender.  Central nervous system: mild L sided facial droop, moving all extremities  Extremities: no LEE    Data Reviewed: I have personally reviewed following labs and imaging studies  CBC: Recent Labs  Lab 05/18/22 0011 05/18/22 0410 05/20/22 0319  WBC 17.9* 17.6* 12.8*  NEUTROABS 13.1*  --   --   HGB 15.6* 14.2 14.4  HCT 44.5 42.0 42.3  MCV 84.3 85.5 85.1  PLT 264 273 431    Basic Metabolic Panel: Recent Labs  Lab 05/18/22 0011 05/18/22 0410 05/20/22 0319  NA 134* 136 140  K 3.1* 3.4* 3.5  CL 101 104 107  CO2 24 24 20*  GLUCOSE 107*  96 128*  BUN '12 10 10  '$ CREATININE 0.57 0.56 0.57  CALCIUM 9.0 8.6* 9.2  MG 2.0  --   --     GFR: Estimated Creatinine Clearance: 58 mL/min (by C-G formula based on SCr of 0.57 mg/dL).  Liver Function Tests: Recent Labs  Lab 05/18/22 0011 05/18/22 0410  AST 16 12*  ALT 19 16  ALKPHOS 89 75  BILITOT 0.4 0.6  PROT 7.4 6.2*  ALBUMIN 3.9 3.3*    CBG: No results for input(s): "GLUCAP" in the last 168 hours.   Recent Results (from the past 240 hour(s))  Culture, blood (Routine X 2) w Reflex to ID Panel     Status: None (Preliminary result)   Collection Time: 05/18/22  4:10 AM   Specimen: BLOOD LEFT HAND  Result Value Ref Range Status   Specimen Description   Final  BLOOD LEFT HAND BOTTLES DRAWN AEROBIC AND ANAEROBIC   Special Requests Blood Culture adequate volume  Final   Culture   Final    NO GROWTH 2 DAYS Performed at [facility], [address] [place], [place] [address]    Report Status PENDING  Incomplete  Culture, blood (Routine X 2) w Reflex to ID Panel     Status: None (Preliminary result)   Collection Time: 05/18/22  4:10 AM   Specimen: BLOOD RIGHT HAND  Result Value Ref Range Status   Specimen Description   Final    BLOOD RIGHT HAND BOTTLES DRAWN AEROBIC AND ANAEROBIC   Special Requests Blood Culture adequate volume  Final   Culture   Final    NO GROWTH 2 DAYS Performed at [facility], [address], [place], [place] [address]    Report Status PENDING  Incomplete         Radiology Studies: PERIPHERAL VASCULAR CATHETERIZATION  Result Date: 05/20/2022 Images from the original result were not included.   Patient name: Diamond Adams   MRN: [id number]        DOB: 1961-01-19          Sex: female  05/20/2022 Pre-operative Diagnosis: Symptomatic right ICA stenosis Post-operative diagnosis:  Same Surgeon:  Broadus John, MD Procedure Performed: 1.  Ultrasound-guided micropuncture access of the right common femoral artery 2.  Aortic arch angiogram 3.   Selective cannulation of the right common carotid artery 4.  Carotid angiogram 5.  Device assisted closure-Mynx 6.  Moderate sedation-30 minutes  Indications: Patient is Kennan Detter 61 year old female who presented with strokelike symptoms 2 days ago with left-sided weakness.  Imaging demonstrated severe ostial stenosis of the right internal carotid artery with concern for distal occlusion.  After discussing the risk and benefits of cerebral angiogram in an effort to define patency for possible transcarotid artery revascularization, [place] elected to proceed.   Findings: Aortic arch angiogram: Type III aortic arch.  No flow-limiting stenosis appreciated in first-order arch branches.  Widely patent bilateral subclavian arteries, bilateral vertebral arteries. Greater than 90% stenosis appreciated at the ostia of the right internal carotid artery.  The internal carotid arteries patent throughout.  The anterior cerebral artery is not appreciated.  Middle cerebral artery fills.              Procedure:  The patient was identified in the holding area and taken to room 8.  The patient was then placed supine on the table and prepped and draped in the usual sterile fashion.  Belina Mandile time out was called.  Ultrasound was used to evaluate the right common femoral artery.  It was patent .  Bence Trapp digital ultrasound image was acquired.  Yael Angerer micropuncture needle was used to access the right common femoral artery under ultrasound guidance.  An 018 wire was advanced without resistance and Rashid Whitenight micropuncture sheath was placed.  The patient was heparinized and Senna Lape 0.035 wire was advanced into the ascending aorta followed by Abednego Yeates flush pigtail catheter.  Arch angiogram followed.  See results above.  Next, Tereka Thorley vertebral catheter was used to cannulate the innominate artery and further used to cannulate the right common carotid artery.  Carotid artery angiography followed in both anterior and lateral views to evaluate ostial stenosis as well as cerebral filling.  See  results above. The groin was closed using Kristiana Jacko minx device.  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  The right ICA is patent, filling the right middle  cerebral artery.  The right anterior communicating artery was not appreciated.    Cassandria Santee, MD [facility] [place] Office: [phone]   ECHOCARDIOGRAM COMPLETE  Result Date: 05/19/2022    ECHOCARDIOGRAM REPORT   Patient Name:   MACKENZI KROGH Date of Exam: 05/19/2022 Medical Rec #:  [id number]     Height:       58.0 in Accession #:    [id number]    Weight:       132.0 lb Date of Birth:  July 30, 1961     BSA:          1.526 m Patient Age:    51 years      BP:           142/60 mmHg Patient Gender: F             HR:           60 bpm. Exam Location:  Inpatient Procedure: 2D Echo, Cardiac Doppler and Color Doppler Indications:    Stroke  History:        Patient has no prior history of Echocardiogram examinations.                 Risk Factors:Hypertension.  Sonographer:    Joette Catching RCS Referring Phys: [id number] ASIA B [place]  Sonographer Comments: Suboptimal parasternal window. IMPRESSIONS  1. Left ventricular ejection fraction, by estimation, is 60 to 65%. The left ventricle has normal function. The left ventricle has no regional wall motion abnormalities. Left ventricular diastolic parameters were normal.  2. Right ventricular systolic function is normal. The right ventricular size is normal.  3. The mitral valve is normal in structure. No evidence of mitral valve regurgitation. No evidence of mitral stenosis.  4. The aortic valve is normal in structure. Aortic valve regurgitation is not visualized. No aortic stenosis is present.  5. The inferior vena cava is normal in size with greater than 50% respiratory variability, suggesting right atrial pressure of 3 mmHg. FINDINGS  Left Ventricle: Left ventricular ejection fraction, by estimation, is 60 to 65%. The left ventricle has normal function. The  left ventricle has no regional wall motion abnormalities. The left ventricular internal cavity size was normal in size. There is  no left ventricular hypertrophy. Left ventricular diastolic parameters were normal. Right Ventricle: The right ventricular size is normal. No increase in right ventricular wall thickness. Right ventricular systolic function is normal. Left Atrium: Left atrial size was normal in size. Right Atrium: Right atrial size was normal in size. Pericardium: There is no evidence of pericardial effusion. Mitral Valve: The mitral valve is normal in structure. No evidence of mitral valve regurgitation. No evidence of mitral valve stenosis. Tricuspid Valve: The tricuspid valve is normal in structure. Tricuspid valve regurgitation is not demonstrated. No evidence of tricuspid stenosis. Aortic Valve: The aortic valve is normal in structure. Aortic valve regurgitation is not visualized. No aortic stenosis is present. Aortic valve mean gradient measures 8.0 mmHg. Aortic valve peak gradient measures 16.5 mmHg. Aortic valve area, by VTI measures 1.28 cm. Pulmonic Valve: The pulmonic valve was normal in structure. Pulmonic valve regurgitation is not visualized. No evidence of pulmonic stenosis. Aorta: The aortic root is normal in size and structure. Venous: The inferior vena cava is normal in size with greater than 50% respiratory variability, suggesting right atrial pressure of 3 mmHg. IAS/Shunts: No atrial level shunt detected by color flow Doppler.  LEFT VENTRICLE PLAX 2D LVIDd:  3.70 cm   Diastology LVIDs:         2.00 cm   LV e' medial:    7.95 cm/s LV PW:         0.90 cm   LV E/e' medial:  12.0 LV IVS:        0.70 cm   LV e' lateral:   12.40 cm/s LVOT diam:     1.60 cm   LV E/e' lateral: 7.7 LV SV:         58 LV SV Index:   38 LVOT Area:     2.01 cm  RIGHT VENTRICLE             IVC RV Basal diam:  3.10 cm     IVC diam: 1.60 cm RV Mid diam:    1.70 cm RV S prime:     14.30 cm/s TAPSE (M-mode):  2.5 cm LEFT ATRIUM             Index        RIGHT ATRIUM          Index LA diam:        2.60 cm 1.70 cm/m   RA Area:     9.36 cm LA Vol (A2C):   41.5 ml 27.19 ml/m  RA Volume:   16.00 ml 10.48 ml/m LA Vol (A4C):   20.9 ml 13.69 ml/m LA Biplane Vol: 29.0 ml 19.00 ml/m  AORTIC VALVE                     PULMONIC VALVE AV Area (Vmax):    1.42 cm      PV Vmax:       1.43 m/s AV Area (Vmean):   1.36 cm      PV Peak grad:  8.2 mmHg AV Area (VTI):     1.28 cm AV Vmax:           203.00 cm/s AV Vmean:          131.000 cm/s AV VTI:            0.455 m AV Peak Grad:      16.5 mmHg AV Mean Grad:      8.0 mmHg LVOT Vmax:         143.00 cm/s LVOT Vmean:        88.800 cm/s LVOT VTI:          0.289 m LVOT/AV VTI ratio: 0.64  AORTA Ao Root diam: 2.70 cm Ao Asc diam:  2.50 cm MITRAL VALVE MV Area (PHT): 3.56 cm    SHUNTS MV Decel Time: 213 msec    Systemic VTI:  0.29 m MV E velocity: 95.40 cm/s  Systemic Diam: 1.60 cm MV Jan Walters velocity: 90.70 cm/s MV E/Kenedi Cilia ratio:  1.05 Mihai Croitoru MD Electronically signed by Sanda Klein MD Signature Date/Time: 05/19/2022/12:35:28 PM    Final         Scheduled Meds:  ALPRAZolam  0.25 mg Oral QHS   aspirin EC  325 mg Oral Daily   clopidogrel  75 mg Oral Daily   FLUoxetine  10 mg Oral Daily   heparin  5,000 Units Subcutaneous Q8H   pantoprazole  40 mg Oral Daily   rosuvastatin  20 mg Oral Daily   Continuous Infusions:  sodium chloride 100 mL/hr at 05/20/22 0553     LOS: 2 days    Time spent: over 30 min    Fayrene Helper, MD [facility]  To contact the attending provider between 7A-7P or the covering provider during after hours 7P-7A, please log into the web site [email or web address] and access using universal [place] password for that web site. If you do not have the password, please call the hospital operator.  05/20/2022, 8:56 PM

## 2022-05-20 NOTE — Progress Notes (Addendum)
Carotid angiogram today confirms that the right carotid is patent with a high-grade proximal ICA stenosis.  I have reviewed the images from Dr. Virl Cagey.  I think the patient would benefit from right TCAR with carotid stenting using flow reversal for neuro embolic protection.  She is already on aspirin Plavix and statin for risk reduction.  I have offered her surgery next week with one of my partners since I am out of town or on Monday, July 10.  Patient changed her mind and now wants next week.  Scheduled for July 6 with Dr. Virl Cagey.  Vascular will continue to follow.  Discussed this would be within the 2-week window that are our general guidelines for carotid intervention.  Discussed carotid intervention is for stroke risk reduction but procedure does carry a 1% perioperative stroke risk.  Marty Heck, MD [facility] [place] Office: [place]

## 2022-05-20 NOTE — Progress Notes (Signed)
Pt admitted to rm 2 from cath lab. CHG wipe given. Oriented pt to the unit. Initiated tele . VSS. Will continue to monitor pt.   Lavenia Atlas, RN

## 2022-05-21 ENCOUNTER — Other Ambulatory Visit: Payer: Self-pay

## 2022-05-21 ENCOUNTER — Encounter (HOSPITAL_COMMUNITY): Payer: Self-pay | Admitting: Physical Medicine and Rehabilitation

## 2022-05-21 ENCOUNTER — Inpatient Hospital Stay (HOSPITAL_COMMUNITY)
Admission: RE | Admit: 2022-05-21 | Discharge: 2022-05-26 | DRG: 057 | Disposition: A | Discharge status: Home / Self Care | Payer: Medicare Other | Source: Intra-hospital | Attending: Physical Medicine and Rehabilitation | Admitting: Physical Medicine and Rehabilitation

## 2022-05-21 DIAGNOSIS — Y848 Other medical procedures as the cause of abnormal reaction of the patient, or of later complication, without mention of misadventure at the time of the procedure: Secondary | ICD-10-CM | POA: Diagnosis present

## 2022-05-21 DIAGNOSIS — I10 Essential (primary) hypertension: Secondary | ICD-10-CM | POA: Diagnosis present

## 2022-05-21 DIAGNOSIS — Z8249 Family history of ischemic heart disease and other diseases of the circulatory system: Secondary | ICD-10-CM

## 2022-05-21 DIAGNOSIS — R5381 Other malaise: Secondary | ICD-10-CM | POA: Diagnosis not present

## 2022-05-21 DIAGNOSIS — F411 Generalized anxiety disorder: Secondary | ICD-10-CM | POA: Diagnosis present

## 2022-05-21 DIAGNOSIS — Z79899 Other long term (current) drug therapy: Secondary | ICD-10-CM

## 2022-05-21 DIAGNOSIS — R1031 Right lower quadrant pain: Secondary | ICD-10-CM | POA: Diagnosis not present

## 2022-05-21 DIAGNOSIS — Z90710 Acquired absence of both cervix and uterus: Secondary | ICD-10-CM

## 2022-05-21 DIAGNOSIS — Z1321 Encounter for screening for nutritional disorder: Secondary | ICD-10-CM | POA: Diagnosis not present

## 2022-05-21 DIAGNOSIS — Z825 Family history of asthma and other chronic lower respiratory diseases: Secondary | ICD-10-CM

## 2022-05-21 DIAGNOSIS — I69354 Hemiplegia and hemiparesis following cerebral infarction affecting left non-dominant side: Secondary | ICD-10-CM | POA: Diagnosis not present

## 2022-05-21 DIAGNOSIS — E785 Hyperlipidemia, unspecified: Secondary | ICD-10-CM | POA: Diagnosis not present

## 2022-05-21 DIAGNOSIS — I63511 Cerebral infarction due to unspecified occlusion or stenosis of right middle cerebral artery: Secondary | ICD-10-CM | POA: Diagnosis not present

## 2022-05-21 DIAGNOSIS — F32A Depression, unspecified: Secondary | ICD-10-CM | POA: Diagnosis present

## 2022-05-21 DIAGNOSIS — Z9181 History of falling: Secondary | ICD-10-CM

## 2022-05-21 DIAGNOSIS — K219 Gastro-esophageal reflux disease without esophagitis: Secondary | ICD-10-CM | POA: Diagnosis not present

## 2022-05-21 DIAGNOSIS — M797 Fibromyalgia: Secondary | ICD-10-CM | POA: Diagnosis not present

## 2022-05-21 DIAGNOSIS — L7632 Postprocedural hematoma of skin and subcutaneous tissue following other procedure: Secondary | ICD-10-CM | POA: Diagnosis not present

## 2022-05-21 DIAGNOSIS — I6521 Occlusion and stenosis of right carotid artery: Secondary | ICD-10-CM | POA: Diagnosis present

## 2022-05-21 DIAGNOSIS — Z888 Allergy status to other drugs, medicaments and biological substances status: Secondary | ICD-10-CM

## 2022-05-21 DIAGNOSIS — I69398 Other sequelae of cerebral infarction: Secondary | ICD-10-CM | POA: Diagnosis not present

## 2022-05-21 DIAGNOSIS — F17210 Nicotine dependence, cigarettes, uncomplicated: Secondary | ICD-10-CM | POA: Diagnosis not present

## 2022-05-21 DIAGNOSIS — Z823 Family history of stroke: Secondary | ICD-10-CM | POA: Diagnosis not present

## 2022-05-21 DIAGNOSIS — R531 Weakness: Secondary | ICD-10-CM | POA: Diagnosis present

## 2022-05-21 DIAGNOSIS — E876 Hypokalemia: Secondary | ICD-10-CM | POA: Diagnosis not present

## 2022-05-21 DIAGNOSIS — D649 Anemia, unspecified: Secondary | ICD-10-CM | POA: Diagnosis not present

## 2022-05-21 DIAGNOSIS — Z8049 Family history of malignant neoplasm of other genital organs: Secondary | ICD-10-CM

## 2022-05-21 DIAGNOSIS — T148XXA Other injury of unspecified body region, initial encounter: Secondary | ICD-10-CM

## 2022-05-21 LAB — CBC WITH DIFFERENTIAL/PLATELET
Abs Immature Granulocytes: 0.09 10*3/uL — ABNORMAL HIGH (ref 0.00–0.07)
Basophils Absolute: 0.1 10*3/uL (ref 0.0–0.1)
Basophils Relative: 1 %
Eosinophils Absolute: 0.2 10*3/uL (ref 0.0–0.5)
Eosinophils Relative: 1 %
HCT: 39.7 % (ref 36.0–46.0)
Hemoglobin: 13.6 g/dL (ref 12.0–15.0)
Immature Granulocytes: 1 %
Lymphocytes Relative: 22 %
Lymphs Abs: 3.2 10*3/uL (ref 0.7–4.0)
MCH: 29.8 pg (ref 26.0–34.0)
MCHC: 34.3 g/dL (ref 30.0–36.0)
MCV: 86.9 fL (ref 80.0–100.0)
Monocytes Absolute: 1 10*3/uL (ref 0.1–1.0)
Monocytes Relative: 7 %
Neutro Abs: 9.7 10*3/uL — ABNORMAL HIGH (ref 1.7–7.7)
Neutrophils Relative %: 68 %
Platelets: 282 10*3/uL (ref 150–400)
RBC: 4.57 MIL/uL (ref 3.87–5.11)
RDW: 13.2 % (ref 11.5–15.5)
WBC: 14.1 10*3/uL — ABNORMAL HIGH (ref 4.0–10.5)
nRBC: 0 % (ref 0.0–0.2)

## 2022-05-21 LAB — PHOSPHORUS: Phosphorus: 4.1 mg/dL (ref 2.5–4.6)

## 2022-05-21 LAB — COMPREHENSIVE METABOLIC PANEL
ALT: 17 U/L (ref 0–44)
AST: 18 U/L (ref 15–41)
Albumin: 3.3 g/dL — ABNORMAL LOW (ref 3.5–5.0)
Alkaline Phosphatase: 74 U/L (ref 38–126)
Anion gap: 8 (ref 5–15)
BUN: 17 mg/dL (ref 6–20)
CO2: 20 mmol/L — ABNORMAL LOW (ref 22–32)
Calcium: 8.8 mg/dL — ABNORMAL LOW (ref 8.9–10.3)
Chloride: 111 mmol/L (ref 98–111)
Creatinine, Ser: 0.85 mg/dL (ref 0.44–1.00)
GFR, Estimated: >60 mL/min (ref >60)
Glucose, Bld: 98 mg/dL (ref 70–99)
Potassium: 3.8 mmol/L (ref 3.5–5.1)
Sodium: 139 mmol/L (ref 135–145)
Total Bilirubin: 0.4 mg/dL (ref 0.3–1.2)
Total Protein: 6 g/dL — ABNORMAL LOW (ref 6.5–8.1)

## 2022-05-21 LAB — MAGNESIUM: Magnesium: 2 mg/dL (ref 1.7–2.4)

## 2022-05-21 MED ORDER — BISACODYL 5 MG PO TBEC: 5.0000 mg | DELAYED_RELEASE_TABLET | Freq: Every day | ORAL | Status: DC | PRN

## 2022-05-21 MED ORDER — CLOPIDOGREL BISULFATE 75 MG PO TABS
75.0000 mg | ORAL_TABLET | Freq: Every day | ORAL | Status: DC
  Administered 2022-05-22 – 2022-05-26 (×5): 75 mg via ORAL
  Filled 2022-05-21 (×5): qty 1

## 2022-05-21 MED ORDER — METHOCARBAMOL 500 MG PO TABS: 500.0000 mg | ORAL_TABLET | Freq: Four times a day (QID) | ORAL | Status: DC | PRN

## 2022-05-21 MED ORDER — FLEET ENEMA 7-19 GM/118ML RE ENEM: 1.0000 | ENEMA | Freq: Once | RECTAL | Status: DC | PRN

## 2022-05-21 MED ORDER — MAGNESIUM HYDROXIDE 400 MG/5ML PO SUSP: 30.0000 mL | Freq: Every day | ORAL | Status: DC | PRN

## 2022-05-21 MED ORDER — OXYCODONE HCL 5 MG PO TABS: 5.0000 mg | ORAL_TABLET | ORAL | Status: DC | PRN

## 2022-05-21 MED ORDER — ASPIRIN 325 MG PO TBEC
325.0000 mg | DELAYED_RELEASE_TABLET | Freq: Every day | ORAL | Status: DC
  Administered 2022-05-22 – 2022-05-26 (×5): 325 mg via ORAL
  Filled 2022-05-21 (×5): qty 1

## 2022-05-21 MED ORDER — ROSUVASTATIN CALCIUM 20 MG PO TABS
20.0000 mg | ORAL_TABLET | Freq: Every day | ORAL | Status: DC
  Administered 2022-05-22 – 2022-05-26 (×5): 20 mg via ORAL
  Filled 2022-05-21 (×5): qty 1

## 2022-05-21 MED ORDER — HEPARIN SODIUM (PORCINE) 5000 UNIT/ML IJ SOLN
5000.0000 [IU] | Freq: Three times a day (TID) | INTRAMUSCULAR | Status: DC
  Administered 2022-05-21 – 2022-05-26 (×14): 5000 [IU] via SUBCUTANEOUS
  Filled 2022-05-21 (×14): qty 1

## 2022-05-21 MED ORDER — ROSUVASTATIN CALCIUM 20 MG PO TABS: 20.0000 mg | ORAL_TABLET | Freq: Every day | ORAL | 1 refills | Status: DC

## 2022-05-21 MED ORDER — ACETAMINOPHEN 325 MG PO TABS
325.0000 mg | ORAL_TABLET | ORAL | Status: DC | PRN
  Administered 2022-05-22 – 2022-05-25 (×8): 650 mg via ORAL
  Filled 2022-05-21 (×7): qty 2

## 2022-05-21 MED ORDER — PROCHLORPERAZINE EDISYLATE 10 MG/2ML IJ SOLN: 5.0000 mg | Freq: Four times a day (QID) | INTRAMUSCULAR | Status: DC | PRN

## 2022-05-21 MED ORDER — FLUOXETINE HCL 10 MG PO CAPS
10.0000 mg | ORAL_CAPSULE | Freq: Every day | ORAL | Status: DC
  Administered 2022-05-22 – 2022-05-26 (×5): 10 mg via ORAL
  Filled 2022-05-21 (×5): qty 1

## 2022-05-21 MED ORDER — ASPIRIN 325 MG PO TBEC: 325.0000 mg | DELAYED_RELEASE_TABLET | Freq: Every day | ORAL | 1 refills | Status: DC

## 2022-05-21 MED ORDER — GUAIFENESIN-DM 100-10 MG/5ML PO SYRP: 5.0000 mL | ORAL_SOLUTION | Freq: Four times a day (QID) | ORAL | Status: DC | PRN

## 2022-05-21 MED ORDER — PROCHLORPERAZINE MALEATE 5 MG PO TABS: 5.0000 mg | ORAL_TABLET | Freq: Four times a day (QID) | ORAL | Status: DC | PRN

## 2022-05-21 MED ORDER — ALUM & MAG HYDROXIDE-SIMETH 200-200-20 MG/5ML PO SUSP: 30.0000 mL | ORAL | Status: DC | PRN

## 2022-05-21 MED ORDER — PROCHLORPERAZINE 25 MG RE SUPP: 12.5000 mg | Freq: Four times a day (QID) | RECTAL | Status: DC | PRN

## 2022-05-21 MED ORDER — HEPARIN SODIUM (PORCINE) 5000 UNIT/ML IJ SOLN: 5000.0000 [IU] | Freq: Three times a day (TID) | INTRAMUSCULAR | Status: DC

## 2022-05-21 MED ORDER — ALPRAZOLAM 0.25 MG PO TABS
0.2500 mg | ORAL_TABLET | Freq: Every day | ORAL | Status: DC
  Administered 2022-05-21 – 2022-05-25 (×5): 0.25 mg via ORAL
  Filled 2022-05-21 (×5): qty 1

## 2022-05-21 MED ORDER — CLOPIDOGREL BISULFATE 75 MG PO TABS
75.0000 mg | ORAL_TABLET | Freq: Every day | ORAL | 1 refills | Status: DC
Start: 2022-05-22 — End: 2022-05-25

## 2022-05-21 MED ORDER — DIPHENHYDRAMINE HCL 12.5 MG/5ML PO ELIX: 12.5000 mg | ORAL_SOLUTION | Freq: Four times a day (QID) | ORAL | Status: DC | PRN

## 2022-05-21 MED ORDER — PANTOPRAZOLE SODIUM 40 MG PO TBEC
40.0000 mg | DELAYED_RELEASE_TABLET | Freq: Every day | ORAL | Status: DC
  Administered 2022-05-22 – 2022-05-26 (×5): 40 mg via ORAL
  Filled 2022-05-21 (×5): qty 1

## 2022-05-21 NOTE — H&P (Incomplete)
Physical Medicine and Rehabilitation Admission H&P    CC: Functional deficits secondary to right MCA scattered infarcts  HPI: Diamond Adams is a 61 year old female who presented to the [facility] emergency department on 05/17/2022 after acute onset of dizziness a fall to the ground and complaining of numbness in all 4 extremities.  She also reported intermittent episodes of extremity weakness and paresthesias over the last 2 weeks.  Telemetry neurology consulted and the patient given aspirin. Work-up to rule in or out acute stroke.  She underwent CTA which showed carotid dissection and vascular surgery consulted.  Neurology advised to initiate Plavix.  She was transferred to [facility].  She was outside the window for tenecteplase.  She underwent EEG which suggested cortical dysfunction arising from right temporal region.  No seizures or elliptic form discharges.  MRI of the brain revealed right MCA scattered infarcts largest at the right frontoparietal region.  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  On 6/28, she underwent carotid angiogram by Dr. Virl Cagey.  Greater then 90% stenosis appreciated at the ostium of the right ICA.  She will be maintained on aspirin 325 mg daily and Plavix 75 mg daily with plans to proceed with right TCAR on 7/6 with Dr. Virl Cagey.  Right groin stable status post angiogram.  Reporting anxiety at nighttime and given alprazolam 0.25 mg nightly.  She is tolerating a regular diet and will be maintained on Plavix, aspirin and statin. The patient requires inpatient physical medicine and rehabilitation evaluations and treatment secondary to dysfunction due to right MCA territory scattered infarcts.  ROS Past Medical History:  Diagnosis Date   Anxiety disorder    Depression    Fibromyalgia    Herpes    Hypertension    PONV (postoperative nausea and vomiting)    Past Surgical History:  Procedure Laterality Date    ABDOMINAL HYSTERECTOMY  1994   AORTIC ARCH ANGIOGRAPHY N/A 05/20/2022   Procedure: AORTIC ARCH ANGIOGRAPHY;  Surgeon: Broadus John, MD;  Location: [place] [facility];  Service: Cardiovascular;  Laterality: N/A;   CAROTID ANGIOGRAPHY N/A 05/20/2022   Procedure: CAROTID ANGIOGRAPHY;  Surgeon: Broadus John, MD;  Location: [place] [facility];  Service: Cardiovascular;  Laterality: N/A;   CARPAL TUNNEL RELEASE  2013,2011   right and left   EXCISION NASAL MASS Left 09/25/2013   Procedure: EXCISION NASAL CANCER WITH SPLIT THICKNESS SKIN GRAFT FROM RIGHT THIGH;  Surgeon: Ascencion Dike, MD;  Location: [place];  Service: ENT;  Laterality: Left;   RIGHT OOPHORECTOMY     THUMB ARTHROSCOPY     right   TONSILLECTOMY     TUBAL LIGATION     Family History  Problem Relation Age of Onset   Heart disease Father    Emphysema Mother    Cancer Sister        cervical   Social History:  reports that she has been smoking cigarettes. She has been smoking an average of .5 packs per day. She has never used smokeless tobacco. She reports that she does not drink alcohol and does not use drugs. Allergies:  Allergies  Allergen Reactions   Trazodone And Nefazodone Hives   Medications Prior to Admission  Medication Sig Dispense Refill   acetaminophen (TYLENOL) 500 MG tablet Take 500 mg by mouth every 6 (six) hours as needed for mild pain or moderate pain.     lisinopril-hydrochlorothiazide (ZESTORETIC) 20-25  MG tablet Take 1 tablet by mouth in the morning and at bedtime.     meclizine (ANTIVERT) 25 MG tablet Take 25 mg by mouth 2 (two) times daily as needed for dizziness.     metoprolol succinate (TOPROL-XL) 25 MG 24 hr tablet Take 12.5 mg by mouth daily.     pantoprazole (PROTONIX) 40 MG tablet Take 40 mg by mouth daily as needed (reflux).     pravastatin (PRAVACHOL) 40 MG tablet Take 40 mg by mouth daily.         Home: Home Living Family/patient expects to be discharged to:: Private  residence Living Arrangements: Children (daughter works outside the home, job doesn't allow working from home.) Available Help at Discharge: Available PRN/intermittently Type of Home: House Home Access: Stairs to enter Technical brewer of Steps: Bertie: Two level, Able to live on main level with bedroom/bathroom Bathroom Shower/Tub: Chiropodist: Handicapped height Home Equipment: None  Lives With: Daughter   Functional History: Prior Function Prior Level of Function : Independent/Modified Independent Mobility Comments: no AD use ADLs Comments: ind with IADLs, watches grandchild during the day  Functional Status:  Mobility: Bed Mobility Overal bed mobility: Needs Assistance Bed Mobility: Supine to Sit, Sit to Supine Supine to sit: Supervision Sit to supine: Supervision Transfers Overall transfer level: Needs assistance Equipment used: Rolling walker (2 wheels) Transfers: Sit to/from Stand Sit to Stand: Min guard, Supervision Ambulation/Gait Ambulation/Gait assistance: Min guard Gait Distance (Feet): 20 Feet (x2) Assistive device: Rolling walker (2 wheels) Gait Pattern/deviations: Step-to pattern, Decreased step length - right, Decreased step length - left, Decreased stride length General Gait Details: L LE mildly uncoordinated with swing through and stance, Generally appears for guarded than unsteady Gait velocity interpretation: <1.8 ft/sec, indicate of risk for recurrent falls    ADL: ADL Overall ADL's : Needs assistance/impaired Eating/Feeding: Set up, Sitting Grooming: Min guard, Sitting Upper Body Bathing: Minimal assistance, Sitting Lower Body Bathing: Minimal assistance, Sitting/lateral leans Upper Body Dressing : Minimal assistance, Sitting Lower Body Dressing: Minimal assistance, Sitting/lateral leans, Sit to/from stand Toilet Transfer: Min guard, Rolling walker (2 wheels) Toileting- Clothing Manipulation and Hygiene: Min  guard, Sitting/lateral lean Functional mobility during ADLs: Min guard, Rolling walker (2 wheels)  Cognition: Cognition Overall Cognitive Status: Impaired/Different from baseline Orientation Level: Oriented X4 Year: 2023 Month: June Day of Week: Correct Attention: Focused, Sustained Focused Attention: Appears intact Sustained Attention: Appears intact Memory: Impaired Memory Impairment: Decreased short term memory Decreased Short Term Memory: Verbal basic Problem Solving: Impaired Problem Solving Impairment: Functional basic Executive Function: Reasoning Reasoning: Appears intact Cognition Arousal/Alertness: Awake/alert Overall Cognitive Status: Impaired/Different from baseline Area of Impairment: Attention, Memory, Following commands Current Attention Level: Selective Memory: Decreased short-term memory Following Commands: Follows one step commands with increased time, Follows one step commands consistently  Physical Exam: Blood pressure (!) 120/43, pulse 67, temperature 98 F (36.7 C), temperature source Oral, resp. rate 20, height '4\' 10"'$  (1.473 m), weight 59.9 kg, SpO2 95 %. Physical Exam  Results for orders placed or performed during the hospital encounter of 05/17/22 (from the past 48 hour(s))  CBC     Status: Abnormal   Collection Time: 05/20/22  3:19 AM  Result Value Ref Range   WBC 12.8 (H) 4.0 - 10.5 K/uL   RBC 4.97 3.87 - 5.11 MIL/uL   Hemoglobin 14.4 12.0 - 15.0 g/dL   HCT 42.3 36.0 - 46.0 %   MCV 85.1 80.0 - 100.0 fL   MCH 29.0 26.0 -  34.0 pg   MCHC 34.0 30.0 - 36.0 g/dL   RDW 12.9 11.5 - 15.5 %   Platelets 274 150 - 400 K/uL   nRBC 0.0 0.0 - 0.2 %    Comment: Performed at [facility], [place] [address], [place], [place] [address]  Basic metabolic panel     Status: Abnormal   Collection Time: 05/20/22  3:19 AM  Result Value Ref Range   Sodium 140 135 - 145 mmol/L   Potassium 3.5 3.5 - 5.1 mmol/L   Chloride 107 98 - 111 mmol/L   CO2 20 (L) 22 - 32  mmol/L   Glucose, Bld 128 (H) 70 - 99 mg/dL    Comment: Glucose reference range applies only to samples taken after fasting for at least 8 hours.   BUN 10 6 - 20 mg/dL   Creatinine, Ser 0.57 0.44 - 1.00 mg/dL   Calcium 9.2 8.9 - 10.3 mg/dL   GFR, Estimated >60 >60 mL/min    Comment: (NOTE) Calculated using the CKD-EPI Creatinine Equation (2021)    Anion gap 13 5 - 15    Comment: Performed at [facility] [address] [place], [place] [address]  CBC with Differential/Platelet     Status: Abnormal   Collection Time: 05/21/22  2:42 AM  Result Value Ref Range   WBC 14.1 (H) 4.0 - 10.5 K/uL   RBC 4.57 3.87 - 5.11 MIL/uL   Hemoglobin 13.6 12.0 - 15.0 g/dL   HCT 39.7 36.0 - 46.0 %   MCV 86.9 80.0 - 100.0 fL   MCH 29.8 26.0 - 34.0 pg   MCHC 34.3 30.0 - 36.0 g/dL   RDW 13.2 11.5 - 15.5 %   Platelets 282 150 - 400 K/uL   nRBC 0.0 0.0 - 0.2 %   Neutrophils Relative % 68 %   Neutro Abs 9.7 (H) 1.7 - 7.7 K/uL   Lymphocytes Relative 22 %   Lymphs Abs 3.2 0.7 - 4.0 K/uL   Monocytes Relative 7 %   Monocytes Absolute 1.0 0.1 - 1.0 K/uL   Eosinophils Relative 1 %   Eosinophils Absolute 0.2 0.0 - 0.5 K/uL   Basophils Relative 1 %   Basophils Absolute 0.1 0.0 - 0.1 K/uL   Immature Granulocytes 1 %   Abs Immature Granulocytes 0.09 (H) 0.00 - 0.07 K/uL    Comment: Performed at [facility] [address], [place], [place] [address]  Comprehensive metabolic panel     Status: Abnormal   Collection Time: 05/21/22  2:42 AM  Result Value Ref Range   Sodium 139 135 - 145 mmol/L   Potassium 3.8 3.5 - 5.1 mmol/L   Chloride 111 98 - 111 mmol/L   CO2 20 (L) 22 - 32 mmol/L   Glucose, Bld 98 70 - 99 mg/dL    Comment: Glucose reference range applies only to samples taken after fasting for at least 8 hours.   BUN 17 6 - 20 mg/dL   Creatinine, Ser 0.85 0.44 - 1.00 mg/dL   Calcium 8.8 (L) 8.9 - 10.3 mg/dL   Total Protein 6.0 (L) 6.5 - 8.1 g/dL   Albumin 3.3 (L) 3.5 - 5.0 g/dL   AST 18  15 - 41 U/L   ALT 17 0 - 44 U/L   Alkaline Phosphatase 74 38 - 126 U/L   Total Bilirubin 0.4 0.3 - 1.2 mg/dL   GFR, Estimated >60 >60 mL/min    Comment: (NOTE) Calculated using  the CKD-EPI Creatinine Equation (2021)    Anion gap 8 5 - 15    Comment: Performed at [facility], [place] [address], [place], [place] [address]  Phosphorus     Status: None   Collection Time: 05/21/22  2:42 AM  Result Value Ref Range   Phosphorus 4.1 2.5 - 4.6 mg/dL    Comment: Performed at [facility] [address], [place], [place] [address]  Magnesium     Status: None   Collection Time: 05/21/22  2:42 AM  Result Value Ref Range   Magnesium 2.0 1.7 - 2.4 mg/dL    Comment: Performed at [facility] [address], [place], [place] [address]   PERIPHERAL VASCULAR CATHETERIZATION  Result Date: 05/20/2022 Images from the original result were not included.   Patient name: Diamond Adams   MRN: [id number]        DOB: [birth year]          Sex: female  05/20/2022 Pre-operative Diagnosis: Symptomatic right ICA stenosis Post-operative diagnosis:  Same Surgeon:  Broadus John, MD Procedure Performed: 1.  Ultrasound-guided micropuncture access of the right common femoral artery 2.  Aortic arch angiogram 3.  Selective cannulation of the right common carotid artery 4.  Carotid angiogram 5.  Device assisted closure-Mynx 6.  Moderate sedation-30 minutes  Indications: Patient is a 61 year old female who presented with strokelike symptoms 2 days ago with left-sided weakness.  Imaging demonstrated severe ostial stenosis of the right internal carotid artery with concern for distal occlusion.  After discussing the risk and benefits of cerebral angiogram in an effort to define patency for possible transcarotid artery revascularization, [place] elected to proceed.   Findings: Aortic arch angiogram: Type III aortic arch.  No flow-limiting stenosis appreciated in first-order arch branches.  Widely patent bilateral subclavian  arteries, bilateral vertebral arteries. Greater than 90% stenosis appreciated at the ostia of the right internal carotid artery.  The internal carotid arteries patent throughout.  The anterior cerebral artery is not appreciated.  Middle cerebral artery fills.              Procedure:  The patient was identified in the holding area and taken to room 8.  The patient was then placed supine on the table and prepped and draped in the usual sterile fashion.  A time out was called.  Ultrasound was used to evaluate the right common femoral artery.  It was patent .  A digital ultrasound image was acquired.  A micropuncture needle was used to access the right common femoral artery under ultrasound guidance.  An 018 wire was advanced without resistance and a micropuncture sheath was placed.  The patient was heparinized and a 0.035 wire was advanced into the ascending aorta followed by a flush pigtail catheter.  Arch angiogram followed.  See results above.  Next, a vertebral catheter was used to cannulate the innominate artery and further used to cannulate the right common carotid artery.  Carotid artery angiography followed in both anterior and lateral views to evaluate ostial stenosis as well as cerebral filling.  See results above. The groin was closed using a minx device.  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  The right ICA is patent, filling the right middle cerebral artery.  The right anterior communicating artery was not appreciated.    Cassandria Santee, MD [facility] Office: [phone]   ECHOCARDIOGRAM COMPLETE  Result Date: 05/19/2022  ECHOCARDIOGRAM REPORT   Patient Name:   Diamond Adams Date of Exam: 05/19/2022 Medical Rec #:  [id number]     Height:       58.0 in Accession #:    [id number]    Weight:       132.0 lb Date of Birth:  1960-11-29     BSA:          1.526 m Patient Age:    75 years      BP:           142/60 mmHg Patient Gender: F              HR:           60 bpm. Exam Location:  Inpatient Procedure: 2D Echo, Cardiac Doppler and Color Doppler Indications:    Stroke  History:        Patient has no prior history of Echocardiogram examinations.                 Risk Factors:Hypertension.  Sonographer:    Joette Catching RCS Referring Phys: [id number] ASIA B [place]  Sonographer Comments: Suboptimal parasternal window. IMPRESSIONS  1. Left ventricular ejection fraction, by estimation, is 60 to 65%. The left ventricle has normal function. The left ventricle has no regional wall motion abnormalities. Left ventricular diastolic parameters were normal.  2. Right ventricular systolic function is normal. The right ventricular size is normal.  3. The mitral valve is normal in structure. No evidence of mitral valve regurgitation. No evidence of mitral stenosis.  4. The aortic valve is normal in structure. Aortic valve regurgitation is not visualized. No aortic stenosis is present.  5. The inferior vena cava is normal in size with greater than 50% respiratory variability, suggesting right atrial pressure of 3 mmHg. FINDINGS  Left Ventricle: Left ventricular ejection fraction, by estimation, is 60 to 65%. The left ventricle has normal function. The left ventricle has no regional wall motion abnormalities. The left ventricular internal cavity size was normal in size. There is  no left ventricular hypertrophy. Left ventricular diastolic parameters were normal. Right Ventricle: The right ventricular size is normal. No increase in right ventricular wall thickness. Right ventricular systolic function is normal. Left Atrium: Left atrial size was normal in size. Right Atrium: Right atrial size was normal in size. Pericardium: There is no evidence of pericardial effusion. Mitral Valve: The mitral valve is normal in structure. No evidence of mitral valve regurgitation. No evidence of mitral valve stenosis. Tricuspid Valve: The tricuspid valve is normal in structure.  Tricuspid valve regurgitation is not demonstrated. No evidence of tricuspid stenosis. Aortic Valve: The aortic valve is normal in structure. Aortic valve regurgitation is not visualized. No aortic stenosis is present. Aortic valve mean gradient measures 8.0 mmHg. Aortic valve peak gradient measures 16.5 mmHg. Aortic valve area, by VTI measures 1.28 cm. Pulmonic Valve: The pulmonic valve was normal in structure. Pulmonic valve regurgitation is not visualized. No evidence of pulmonic stenosis. Aorta: The aortic root is normal in size and structure. Venous: The inferior vena cava is normal in size with greater than 50% respiratory variability, suggesting right atrial pressure of 3 mmHg. IAS/Shunts: No atrial level shunt detected by color flow Doppler.  LEFT VENTRICLE PLAX 2D LVIDd:         3.70 cm   Diastology LVIDs:         2.00 cm   LV e' medial:    7.95 cm/s LV PW:  0.90 cm   LV E/e' medial:  12.0 LV IVS:        0.70 cm   LV e' lateral:   12.40 cm/s LVOT diam:     1.60 cm   LV E/e' lateral: 7.7 LV SV:         58 LV SV Index:   38 LVOT Area:     2.01 cm  RIGHT VENTRICLE             IVC RV Basal diam:  3.10 cm     IVC diam: 1.60 cm RV Mid diam:    1.70 cm RV S prime:     14.30 cm/s TAPSE (M-mode): 2.5 cm LEFT ATRIUM             Index        RIGHT ATRIUM          Index LA diam:        2.60 cm 1.70 cm/m   RA Area:     9.36 cm LA Vol (A2C):   41.5 ml 27.19 ml/m  RA Volume:   16.00 ml 10.48 ml/m LA Vol (A4C):   20.9 ml 13.69 ml/m LA Biplane Vol: 29.0 ml 19.00 ml/m  AORTIC VALVE                     PULMONIC VALVE AV Area (Vmax):    1.42 cm      PV Vmax:       1.43 m/s AV Area (Vmean):   1.36 cm      PV Peak grad:  8.2 mmHg AV Area (VTI):     1.28 cm AV Vmax:           203.00 cm/s AV Vmean:          131.000 cm/s AV VTI:            0.455 m AV Peak Grad:      16.5 mmHg AV Mean Grad:      8.0 mmHg LVOT Vmax:         143.00 cm/s LVOT Vmean:        88.800 cm/s LVOT VTI:          0.289 m LVOT/AV VTI ratio: 0.64   AORTA Ao Root diam: 2.70 cm Ao Asc diam:  2.50 cm MITRAL VALVE MV Area (PHT): 3.56 cm    SHUNTS MV Decel Time: 213 msec    Systemic VTI:  0.29 m MV E velocity: 95.40 cm/s  Systemic Diam: 1.60 cm MV A velocity: 90.70 cm/s MV E/A ratio:  1.05 Mihai Croitoru MD Electronically signed by Sanda Klein MD Signature Date/Time: 05/19/2022/12:35:28 PM    Final       Blood pressure (!) 120/43, pulse 67, temperature 98 F (36.7 C), temperature source Oral, resp. rate 20, height '4\' 10"'$  (1.473 m), weight 59.9 kg, SpO2 95 %.  Medical Problem List and Plan: 1. Functional deficits secondary to right scattered MCA territory infarcts  -patient may *** shower  -ELOS/Goals: *** 2.  Antithrombotics: -DVT/anticoagulation:  Pharmaceutical: Heparin  -antiplatelet therapy: Plavix, aspirin 3. Pain Management: Tylenol, oxycodone as needed 4. Mood/Sleep: LCSW to evaluate and provide emotional support  -antipsychotic agents: n/a 5. Neuropsych/cognition: This patient is capable of making decisions on her own behalf. 6. Skin/Wound Care: Routine skin care checks 7. Fluids/Electrolytes/Nutrition:'s and O's and follow-up chemistries 8: Right carotid artery stenosis: Planning right TCAR 7/6 9: History of depression: Continue Prozac 10: Anxiety disorder: Continue alprazolam nightly  11: Anemia: Continue Crestor (at home on Pravachol 40 mg daily) 12: Essential hypertension: holding home Zestoretic 20/25 q AS and qHS as well as Toprol XL 12.5 mg daily  13: Tobacco use disorder: Advised regarding cessation 14: GERD: Continue Protonix ***  [name], PA-C 05/21/2022

## 2022-05-21 NOTE — Discharge Summary (Signed)
Physician Discharge Summary  STARLYN DROGE OAC:[id number] DOB: 1961/10/04 DOA: 05/17/2022  PCP: Glenda Chroman, MD  Admit date: 05/17/2022 Discharge date: 05/21/2022  Time spent: 40 minutes  Recommendations for Outpatient Follow-up:  Follow outpatient CBC/CMP  Follow final vascular recs Continue DAPT/statin per neurology Follow with neurology outpatient  Follow bruising to R groin  Follow blood pressure outpatient - HCTZ/lisinopril/metoprolol d/c'd - consider resumption based on BP going forward  Follow leukocytosis outpatient, if persistent, consider heme follow up  Discharge Diagnoses:  Principal Problem:   CVA (cerebral vascular accident) [facility]) Active Problems:   Carotid artery stenosis   Bruising   Essential hypertension   Generalized weakness   Hypokalemia   Depression   HLD (hyperlipidemia)   Tobacco use disorder   Leukocytosis   Discharge Condition: stable  Diet recommendation: heart healthy   Filed Weights   05/17/22 2334  Weight: 59.9 kg    History of present illness:  This 61 years old female with PMH significant for anxiety disorder, depression, fibromyalgia, hypertension presents in the  [facility] with c/o: generalized weakness x 2 weeks and subsequent fall.  Patient reports intermittent episodes of bilateral upper and lower extremity weakness with paresthesia. She also reports bilateral blurred vision.  All her symptoms returned to baseline.  CT head revealed [place] Perra wedge-shaped area in the right posterior parietal lobe consistent with evolving infarct.  Patient was seen by telemetry neurology, recommended CVA work-up.  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  Vascular surgery was consulted recommended transfer to [facility] for further evaluation.   She had MRI showing acute and subacute infarcts in the R MCA distribution.  The largest area being Izen Petz subacute  infarct in the right parietal cortex.  Now s/p angiogram which showed greater than 90% stenosis at the ostia of the right internal carotid artery.  Plan at this point is for TCAR on 7/6.  Discharge to CIR today.  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 - reconstitution of R ICA at distal petrous segment, likely due to collateral flow across the circle of willis  Carotid US with 80-99% stenosis in R ICA, velocities in L ICA c/w 60-79% stenosis S/p angiogram today, which showed >90% stenosis appreciated at the osteo of the right internal carotid artery Echo with EF 60-65% (see report) Planning for TCAR with vascular 7/6 Neuro c/s appreciate recs -> DAPT, crestor 20, BP goal 130-160 prior to carotid intervention LDL 83, a1c 5.4 PT/OT recommending CIR SLP recommending outpatient SLP  Bruising Pretty extensive bruising at R groin site, but no clear hematoma Will continue to monitor for now on DAPT Ok for discharge to CIR, will need continued follow up  Carotid artery stenosis As above Per vascular Planning TCAR 7/6  Essential hypertension Goal SBP between 130 and 160 prior to carotid intervention Currently holding lisinopril/HCTZ and metoprolol at discharge - follow outpatient and in CIR for resumption Avoid hypotension  Generalized weakness PT/OT  Hypokalemia Replace and recheck  Depression Continue prozac  HLD (hyperlipidemia) Continue statin  Tobacco use disorder  Advised on the importance of cessation  Leukocytosis Currently mild,  fluctuating, follow 23.6 at presentation CXR without acute disease Afebrile Blood cx no growth  UA not c/w UTI Follow  May need outpatient follow up with heme if persistent/not resolving      Procedures: Echo IMPRESSIONS     1. Left ventricular ejection fraction, by estimation, is 60 to 65%. The  left ventricle has normal function. The left ventricle has no regional  wall motion abnormalities. Left ventricular diastolic parameters were  normal.   2. Right ventricular systolic function is normal. The right ventricular  size is normal.   3. The mitral valve is normal in structure. No evidence of mitral valve  regurgitation. No evidence of mitral stenosis.   4. The aortic valve is normal in structure. Aortic valve regurgitation is  not visualized. No aortic stenosis is present.   5. The inferior vena cava is normal in size with greater than 50%  respiratory variability, suggesting right atrial pressure of 3 mmHg.   Procedure Performed: 1.  Ultrasound-guided micropuncture access of the right common femoral artery 2.  Aortic arch angiogram 3.  Selective cannulation of the right common carotid artery  4.  Carotid angiogram 5.  Device assisted closure-Mynx 6.  Moderate sedation-30 minutes  Carotid US Summary:  Right Carotid: Velocities in the right ICA are consistent with Sundi Slevin 80-99%  stenosis                 by peak systolic velocities and plaque morphology. No  obvious                 evidence of dissection.   Left Carotid: Velocities in the left ICA are consistent with Laina Guerrieri 60-79%  stenosis,                however this may be compensatory flow.   Vertebrals:  Bilateral vertebral arteries demonstrate antegrade flow.  Subclavians: Normal flow hemodynamics were seen in bilateral subclavian               arteries.   Consultations: Neurology vascular  Discharge Exam: Vitals:   05/21/22 0754 05/21/22 1225  BP: (!) 120/43 (!) 125/49  Pulse: 67 (!) 57  Resp: 20 18   Temp: 98 F (36.7 C) 98 F (36.7 C)  SpO2: 95%    C/o R groin discomfort Otherwise feeling ok  General: No acute distress. Cardiovascular: RRR Lungs: unlabored Neurological: Alert and oriented 3. Moves all extremities 4.  L sided facial droop noted.  Seen walking with therapy with walker.  Extremities: No clubbing or cyanosis. No edema. Bruising to R groin noted, no palpable hematoma, but extended beyond dressing.   Discharge Instructions   Discharge Instructions     Ambulatory referral to Neurology   Complete by: As directed    Follow up with stroke clinic NP (Jessica Vanschaick or Cecille Rubin, if both not available, consider Zachery Dauer, or Ahern) at [facility] in about 4 weeks. Thanks.   Call MD for:  difficulty breathing, headache or visual disturbances   Complete by: As directed    Call MD for:  extreme fatigue   Complete by: As directed    Call MD for:  hives   Complete by: As directed    Call MD for:  persistant dizziness or light-headedness   Complete by: As directed    Call MD for:  persistant nausea and vomiting   Complete by: As directed  Call MD for:  redness, tenderness, or signs of infection (pain, swelling, redness, odor or green/yellow discharge around incision site)   Complete by: As directed    Call MD for:  severe uncontrolled pain   Complete by: As directed    Call MD for:  temperature >100.4   Complete by: As directed    Diet - low sodium heart healthy   Complete by: As directed    Discharge instructions   Complete by: As directed    You were seen for Andres Bantz stroke.  You have significant carotid artery narrowing on the right side.  Vascular surgery is planning for Anaiz Qazi TCAR on 05/28/2022.  We're sending you to inpatient rehab in the interim.  We've started you on aspirin, plavix, and crestor.  Continue these medicines as directed by vascular surgery and neurology.  At the time of discharge to inpatient rehab, we're holding your blood pressure  medicines (lisinopril-HCTZ and metoprolol).  Continue to follow your blood pressures with your PCP to determine if your medicines need to be restarted or not.  Return for new, recurrent, or worsening symptoms.  Please ask your PCP to request records from this hospitalization so they know what was done and what the next steps will be.   Increase activity slowly   Complete by: As directed       Allergies as of 05/21/2022       Reactions   Trazodone And Nefazodone Hives        Medication List     STOP taking these medications    lisinopril-hydrochlorothiazide 20-25 MG tablet Commonly known as: ZESTORETIC   metoprolol succinate 25 MG 24 hr tablet Commonly known as: TOPROL-XL   pravastatin 40 MG tablet Commonly known as: PRAVACHOL       TAKE these medications    acetaminophen 500 MG tablet Commonly known as: TYLENOL Take 500 mg by mouth every 6 (six) hours as needed for mild pain or moderate pain.   aspirin EC 325 MG tablet Take 1 tablet (325 mg total) by mouth daily. Start taking on: May 22, 2022   clopidogrel 75 MG tablet Commonly known as: PLAVIX Take 1 tablet (75 mg total) by mouth daily. Start taking on: May 22, 2022   meclizine 25 MG tablet Commonly known as: ANTIVERT Take 25 mg by mouth 2 (two) times daily as needed for dizziness.   pantoprazole 40 MG tablet Commonly known as: PROTONIX Take 40 mg by mouth daily as needed (reflux).   rosuvastatin 20 MG tablet Commonly known as: CRESTOR Take 1 tablet (20 mg total) by mouth daily. Start taking on: May 22, 2022       Allergies  Allergen Reactions   Trazodone And Nefazodone Hives    Follow-up Information     [facility]. Schedule an appointment as soon as possible for Yehoshua Vitelli visit in 1 month(s).   Specialty: Neurology Why: stroke clinic Contact information: [address] [place] [phone]                 The results of  significant diagnostics from this hospitalization (including imaging, microbiology, ancillary and laboratory) are listed below for reference.    Significant Diagnostic Studies: PERIPHERAL VASCULAR CATHETERIZATION  Result Date: 05/20/2022 Images from the original result were not included.   Patient name: YARIAH SELVEY   MRN: [id number]        DOB: [birth year]          Sex: female  05/20/2022 Pre-operative Diagnosis: Symptomatic right ICA stenosis Post-operative diagnosis:  Same Surgeon:  Broadus John, MD Procedure Performed: 1.  Ultrasound-guided micropuncture access of the right common femoral artery 2.  Aortic arch angiogram 3.  Selective cannulation of the right common carotid artery 4.  Carotid angiogram 5.  Device assisted closure-Mynx 6.  Moderate sedation-30 minutes  Indications: Patient is Federico Maiorino 61 year old female who presented with strokelike symptoms 2 days ago with left-sided weakness.  Imaging demonstrated severe ostial stenosis of the right internal carotid artery with concern for distal occlusion.  After discussing the risk and benefits of cerebral angiogram in an effort to define patency for possible transcarotid artery revascularization, Merle elected to proceed.   Findings: Aortic arch angiogram: Type III aortic arch.  No flow-limiting stenosis appreciated in first-order arch branches.  Widely patent bilateral subclavian arteries, bilateral vertebral arteries. Greater than 90% stenosis appreciated at the ostia of the right internal carotid artery.  The internal carotid arteries patent throughout.  The anterior cerebral artery is not appreciated.  Middle cerebral artery fills.              Procedure:  The patient was identified in the holding area and taken to room 8.  The patient was then placed supine on the table and prepped and draped in the usual sterile fashion.  Euretha Najarro time out was called.  Ultrasound was used to evaluate the right common femoral artery.  It was patent .  Ivey Nembhard digital ultrasound image  was acquired.  Aella Ronda micropuncture needle was used to access the right common femoral artery under ultrasound guidance.  An 018 wire was advanced without resistance and Cherri Yera micropuncture sheath was placed.  The patient was heparinized and Willy Vorce 0.035 wire was advanced into the ascending aorta followed by Mason Burleigh flush pigtail catheter.  Arch angiogram followed.  See results above.  Next, Damonique Brunelle vertebral catheter was used to cannulate the innominate artery and further used to cannulate the right common carotid artery.  Carotid artery angiography followed in both anterior and lateral views to evaluate ostial stenosis as well as cerebral filling.  See results above. The groin was closed using Dade Rodin minx device.  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  The right ICA is patent, filling the right middle cerebral artery.  The right anterior communicating artery was not appreciated.    Cassandria Santee, MD [facility] Office: [phone]   VAS US CAROTID  Result Date: 05/19/2022 Carotid Arterial Duplex Study Patient Name:  MICKENZIE STOLAR  Date of Exam:   05/18/2022 Medical Rec #: [id number]      Accession #:    [id number] Date of Birth: 05-Mar-1961      Patient Gender: F Patient Age:   8 years Exam Location:  [facility] Procedure:      VAS US CAROTID Referring Phys: Monica Martinez --------------------------------------------------------------------------------  Indications:       Question right ICA dissection and possible occlusion seen on                    CTA. Risk Factors:      Hypertension. Limitations        Today's exam was limited due to the patient's respiratory                    variation and patient movement, patient talking, ambient room  light. Comparison Study:  No prior study Performing Technologist: Maudry Mayhew MHA, RDMS, RVT, RDCS  Examination Guidelines: Marcelo Ickes complete evaluation includes B-mode imaging, spectral Doppler, color  Doppler, and power Doppler as needed of all accessible portions of each vessel. Bilateral testing is considered an integral part of Amaya Blakeman complete examination. Limited examinations for reoccurring indications may be performed as noted.  Right Carotid Findings: +----------+-------+--------+--------+------------------------+----------------+           PSV    EDV cm/sStenosisPlaque Description      Comments                   cm/s                                                            +----------+-------+--------+--------+------------------------+----------------+ CCA Prox  47     4                                                        +----------+-------+--------+--------+------------------------+----------------+ CCA Distal40     7                                                        +----------+-------+--------+--------+------------------------+----------------+ ICA Prox  360    94      80-99%  heterogenous and                                                          irregular                                +----------+-------+--------+--------+------------------------+----------------+ ICA Mid   77                                             thumped waveform +----------+-------+--------+--------+------------------------+----------------+ ICA Distal49     8                                                        +----------+-------+--------+--------+------------------------+----------------+ ECA       144    23                                                       +----------+-------+--------+--------+------------------------+----------------+ +----------+--------+-------+----------------+-------------------+           PSV cm/sEDV cmsDescribe  Arm Pressure (mmHG) +----------+--------+-------+----------------+-------------------+ Subclavian122            Multiphasic, WNL                     +----------+--------+-------+----------------+-------------------+ +---------+--------+--+--------+--+---------+ VertebralPSV cm/s84EDV cm/s17Antegrade +---------+--------+--+--------+--+---------+  Left Carotid Findings: +----------+--------+--------+--------+-----------------------+--------+           PSV cm/sEDV cm/sStenosisPlaque Description     Comments +----------+--------+--------+--------+-----------------------+--------+ CCA Prox  103     33                                              +----------+--------+--------+--------+-----------------------+--------+ CCA Distal109     29                                              +----------+--------+--------+--------+-----------------------+--------+ ICA Prox  274     75      60-79%  smooth and heterogenous         +----------+--------+--------+--------+-----------------------+--------+ ICA Mid   165     24                                              +----------+--------+--------+--------+-----------------------+--------+ ICA Distal154     42                                              +----------+--------+--------+--------+-----------------------+--------+ ECA       129     12                                              +----------+--------+--------+--------+-----------------------+--------+ +----------+--------+--------+----------------+-------------------+           PSV cm/sEDV cm/sDescribe        Arm Pressure (mmHG) +----------+--------+--------+----------------+-------------------+ Subclavian171             Multiphasic, WNL                    +----------+--------+--------+----------------+-------------------+ +---------+--------+--+--------+--+---------+ VertebralPSV cm/s72EDV cm/s20Antegrade +---------+--------+--+--------+--+---------+   Summary: Right Carotid: Velocities in the right ICA are consistent with Staceyann Knouff 80-99% stenosis                by peak systolic velocities and plaque  morphology. No obvious                evidence of dissection. Left Carotid: Velocities in the left ICA are consistent with Kalley Nicholl 60-79% stenosis,               however this may be compensatory flow. Vertebrals:  Bilateral vertebral arteries demonstrate antegrade flow. Subclavians: Normal flow hemodynamics were seen in bilateral subclavian              arteries. *See table(s) above for measurements and observations.  Electronically signed by Harold Barban MD on 05/19/2022 at 10:47:02 PM.    Final    ECHOCARDIOGRAM COMPLETE  Result Date: 05/19/2022    ECHOCARDIOGRAM REPORT  Patient Name:   GAGANDEEP KOSSMAN Date of Exam: 05/19/2022 Medical Rec #:  [id number]     Height:       58.0 in Accession #:    [id number]    Weight:       132.0 lb Date of Birth:  1961/04/15     BSA:          1.526 m Patient Age:    37 years      BP:           142/60 mmHg Patient Gender: F             HR:           60 bpm. Exam Location:  Inpatient Procedure: 2D Echo, Cardiac Doppler and Color Doppler Indications:    Stroke  History:        Patient has no prior history of Echocardiogram examinations.                 Risk Factors:Hypertension.  Sonographer:    Joette Catching RCS Referring Phys: [id number] ASIA B Devine  Sonographer Comments: Suboptimal parasternal window. IMPRESSIONS  1. Left ventricular ejection fraction, by estimation, is 60 to 65%. The left ventricle has normal function. The left ventricle has no regional wall motion abnormalities. Left ventricular diastolic parameters were normal.  2. Right ventricular systolic function is normal. The right ventricular size is normal.  3. The mitral valve is normal in structure. No evidence of mitral valve regurgitation. No evidence of mitral stenosis.  4. The aortic valve is normal in structure. Aortic valve regurgitation is not visualized. No aortic stenosis is present.  5. The inferior vena cava is normal in size with greater than 50% respiratory variability, suggesting right atrial pressure  of 3 mmHg. FINDINGS  Left Ventricle: Left ventricular ejection fraction, by estimation, is 60 to 65%. The left ventricle has normal function. The left ventricle has no regional wall motion abnormalities. The left ventricular internal cavity size was normal in size. There is  no left ventricular hypertrophy. Left ventricular diastolic parameters were normal. Right Ventricle: The right ventricular size is normal. No increase in right ventricular wall thickness. Right ventricular systolic function is normal. Left Atrium: Left atrial size was normal in size. Right Atrium: Right atrial size was normal in size. Pericardium: There is no evidence of pericardial effusion. Mitral Valve: The mitral valve is normal in structure. No evidence of mitral valve regurgitation. No evidence of mitral valve stenosis. Tricuspid Valve: The tricuspid valve is normal in structure. Tricuspid valve regurgitation is not demonstrated. No evidence of tricuspid stenosis. Aortic Valve: The aortic valve is normal in structure. Aortic valve regurgitation is not visualized. No aortic stenosis is present. Aortic valve mean gradient measures 8.0 mmHg. Aortic valve peak gradient measures 16.5 mmHg. Aortic valve area, by VTI measures 1.28 cm. Pulmonic Valve: The pulmonic valve was normal in structure. Pulmonic valve regurgitation is not visualized. No evidence of pulmonic stenosis. Aorta: The aortic root is normal in size and structure. Venous: The inferior vena cava is normal in size with greater than 50% respiratory variability, suggesting right atrial pressure of 3 mmHg. IAS/Shunts: No atrial level shunt detected by color flow Doppler.  LEFT VENTRICLE PLAX 2D LVIDd:         3.70 cm   Diastology LVIDs:         2.00 cm   LV e' medial:    7.95 cm/s LV PW:         0.90  cm   LV E/e' medial:  12.0 LV IVS:        0.70 cm   LV e' lateral:   12.40 cm/s LVOT diam:     1.60 cm   LV E/e' lateral: 7.7 LV SV:         58 LV SV Index:   38 LVOT Area:     2.01 cm   RIGHT VENTRICLE             IVC RV Basal diam:  3.10 cm     IVC diam: 1.60 cm RV Mid diam:    1.70 cm RV S prime:     14.30 cm/s TAPSE (M-mode): 2.5 cm LEFT ATRIUM             Index        RIGHT ATRIUM          Index LA diam:        2.60 cm 1.70 cm/m   RA Area:     9.36 cm LA Vol (A2C):   41.5 ml 27.19 ml/m  RA Volume:   16.00 ml 10.48 ml/m LA Vol (A4C):   20.9 ml 13.69 ml/m LA Biplane Vol: 29.0 ml 19.00 ml/m  AORTIC VALVE                     PULMONIC VALVE AV Area (Vmax):    1.42 cm      PV Vmax:       1.43 m/s AV Area (Vmean):   1.36 cm      PV Peak grad:  8.2 mmHg AV Area (VTI):     1.28 cm AV Vmax:           203.00 cm/s AV Vmean:          131.000 cm/s AV VTI:            0.455 m AV Peak Grad:      16.5 mmHg AV Mean Grad:      8.0 mmHg LVOT Vmax:         143.00 cm/s LVOT Vmean:        88.800 cm/s LVOT VTI:          0.289 m LVOT/AV VTI ratio: 0.64  AORTA Ao Root diam: 2.70 cm Ao Asc diam:  2.50 cm MITRAL VALVE MV Area (PHT): 3.56 cm    SHUNTS MV Decel Time: 213 msec    Systemic VTI:  0.29 m MV E velocity: 95.40 cm/s  Systemic Diam: 1.60 cm MV Donatella Walski velocity: 90.70 cm/s MV E/Toniette Devera ratio:  1.05 Mihai Croitoru MD Electronically signed by Sanda Klein MD Signature Date/Time: 05/19/2022/12:35:28 PM    Final    EEG adult  Result Date: 05/18/2022 Lora Havens, MD     05/18/2022  8:21 PM Patient Name: VANDANA HAMAN MRN: [id number] Epilepsy Attending: Lora Havens Referring Physician/Provider: Rolla Plate, DO Date: 05/18/2022 Duration: 25.48 mins Patient history: 33 YOF presetned with compalint of intermittent numbness/tinlging on left side, weakness all over, left facial symptoms, fall/dizziness symptoms. EEG to evaluate for seizure. Level of alertness: Awake AEDs during EEG study: None Technical aspects: This EEG study was done with scalp electrodes positioned according to the 10-20 International system of electrode placement. Electrical activity was acquired at Corra Kaine sampling rate of '500Hz'$  and reviewed  with Mariyana Fulop high frequency filter of '70Hz'$  and Willo Yoon low frequency filter of '1Hz'$ . EEG data were recorded continuously and digitally stored. Description: The posterior dominant rhythm  consists of 9 Hz activity of moderate voltage (25-35 uV) seen predominantly in posterior head regions, symmetric and reactive to eye opening and eye closing. EEG showed intermittent 3 to 6 Hz theta-delta slowing in right temporal region. Physiologic photic driving was not seen during photic stimulation.  Hyperventilation was not performed.   ABNORMALITY - Intermittent slow, right temporal region. IMPRESSION: This study is suggestive of cortical dysfunction arising from right temporal region, likely secondary to underlying structural abnormality. No seizures or epileptiform discharges were seen throughout the recording. Lora Havens   MR BRAIN W WO CONTRAST  Result Date: 05/18/2022 CLINICAL DATA:  Dizziness with numbness in all 4 extremities.  Fall. EXAM: MRI HEAD WITHOUT AND WITH CONTRAST TECHNIQUE: Multiplanar, multiecho pulse sequences of the brain and surrounding structures were obtained without and with intravenous contrast. CONTRAST:  82m GADAVIST GADOBUTROL 1 MMOL/ML IV SOLN COMPARISON:  Head CT and CTA from earlier today FINDINGS: Brain: Patchy acute infarcts in the right cerebral hemisphere, most confluent in the parietal cortex and subcortical white matter but small infarcts also seen roughly aligned along the right MCA/ACA watershed, in the right periatrial white matter, and near the anterior commissure. Mild mineralization at the parietal cortex infarct which is also enhancing. Some of the watershed type infarcts in the right corona radiata are enhancing as well. No hydrocephalus or masslike finding. No prior gray matter infarcts. Vascular: Absent right ICA flow void in the neck and skull base, known from prior CTA. Skull and upper cervical spine: Normal marrow signal. Sinuses/Orbits: Negative IMPRESSION: 1. 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 2. Known poor or absent flow in the right ICA from prior CTA. Electronically Signed   By: JJorje GuildM.D.   On: 05/18/2022 07:34   CT ANGIO HEAD NECK W WO CM  Result Date: 05/18/2022 CLINICAL DATA:  Dizziness and fall EXAM: CT ANGIOGRAPHY HEAD AND NECK TECHNIQUE: Multidetector CT imaging of the head and neck was performed using the standard protocol during bolus administration of intravenous contrast. Multiplanar CT image reconstructions and MIPs were obtained to evaluate the vascular anatomy. Carotid stenosis measurements (when applicable) are obtained utilizing NASCET criteria, using the distal internal carotid diameter as the denominator. RADIATION DOSE REDUCTION: This exam was performed according to the departmental dose-optimization program which includes automated exposure control, adjustment of the mA and/or kV according to patient size and/or use of iterative reconstruction technique. CONTRAST:  757mOMNIPAQUE IOHEXOL 350 MG/ML SOLN COMPARISON:  None Available. FINDINGS: CTA NECK FINDINGS SKELETON: There is no bony spinal canal stenosis. No lytic or blastic lesion. OTHER NECK: Normal pharynx, larynx and major salivary glands. No cervical lymphadenopathy. Unremarkable thyroid gland. UPPER CHEST: No pneumothorax or pleural effusion. No nodules or masses. AORTIC ARCH: There is no calcific atherosclerosis of the aortic arch. There is no aneurysm, dissection or hemodynamically significant stenosis of the visualized portion of the aorta. Conventional 3 vessel aortic branching pattern. The visualized proximal subclavian arteries are widely patent. RIGHT CAROTID SYSTEM: There is severe stenosis of the right internal carotid artery along its entire length, progressively worsening until it becomes occluded proximal to the skull base. LEFT CAROTID SYSTEM: No dissection, occlusion or aneurysm. Mild  atherosclerotic calcification at the carotid bifurcation without hemodynamically significant stenosis. VERTEBRAL ARTERIES: Codominant configuration. Both origins are clearly patent. There is no dissection, occlusion or flow-limiting stenosis to the skull base (V1-V3 segments). CTA HEAD FINDINGS POSTERIOR CIRCULATION: --Vertebral  arteries: Normal V4 segments. --Inferior cerebellar arteries: Normal. --Basilar artery: Normal. --Superior cerebellar arteries: Normal. --Posterior cerebral arteries (PCA): Normal. ANTERIOR CIRCULATION: --Intracranial internal carotid arteries: There is reconstitution of the right ICA at the distal petrous segment, likely due to collateral flow across the circle-of-Willis. --Anterior cerebral arteries (ACA): Normal. Both A1 segments are present. Patent anterior communicating artery ([place] Kading-comm). --Middle cerebral arteries (MCA): Normal. VENOUS SINUSES: As permitted by contrast timing, patent. ANATOMIC VARIANTS: Fetal origin of the left posterior cerebral artery. Review of the MIP images confirms the above findings. IMPRESSION: 1. 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 2. Reconstitution of the right ICA at the distal petrous segment, likely due to collateral flow across the circle-of-Willis. 3. No intracranial arterial occlusion or hemodynamically significant stenosis. Critical Value/emergent results were called by telephone at the time of interpretation on 05/18/2022 at 3:15 am to provider [facility] , who verbally acknowledged these results. Electronically Signed   By: Ulyses Jarred M.D.   On: 05/18/2022 03:16   CT Head Wo Contrast  Result Date: 05/18/2022 CLINICAL DATA:  Altered mental status and dizziness EXAM: CT HEAD WITHOUT CONTRAST TECHNIQUE: Contiguous axial images were obtained from the base of the skull through the vertex without intravenous contrast. RADIATION DOSE REDUCTION: This exam  was performed according to the departmental dose-optimization program which includes automated exposure control, adjustment of the mA and/or kV according to patient size and/or use of iterative reconstruction technique. COMPARISON:  04/25/2022 FINDINGS: Brain: There is wedge-shaped area of decreased attenuation identified in the right posterior parietal lobe consistent with infarct in evolution. No other focal area of ischemia is noted. No hemorrhage is seen. Vascular: No hyperdense vessel or unexpected calcification. Skull: Normal. Negative for fracture or focal lesion. Sinuses/Orbits: No acute finding. Other: None. IMPRESSION: Wedge-shaped area in the right posterior parietal lobe consistent with an evolving infarct. This appears acute to subacute in nature. Electronically Signed   By: Inez Catalina M.D.   On: 05/18/2022 00:52   DG Chest Portable 1 View  Result Date: 05/18/2022 CLINICAL DATA:  Recent fall EXAM: PORTABLE CHEST 1 VIEW COMPARISON:  02/23/2014 FINDINGS: The heart size and mediastinal contours are within normal limits. Both lungs are clear. The visualized skeletal structures are unremarkable. IMPRESSION: No active disease. Electronically Signed   By: Inez Catalina M.D.   On: 05/18/2022 00:50   CT Head Wo Contrast  Result Date: 04/25/2022 CLINICAL DATA:  Headache dizziness EXAM: CT HEAD WITHOUT CONTRAST TECHNIQUE: Contiguous axial images were obtained from the base of the skull through the vertex without intravenous contrast. RADIATION DOSE REDUCTION: This exam was performed according to the departmental dose-optimization program which includes automated exposure control, adjustment of the mA and/or kV according to patient size and/or use of iterative reconstruction technique. COMPARISON:  CT brain 06/25/2009 FINDINGS: Brain: No evidence of acute infarction, hemorrhage, hydrocephalus, extra-axial collection or mass lesion/mass effect. Vascular: No hyperdense vessel or unexpected calcification. Mild  carotid vascular calcification Skull: Normal. Negative for fracture or focal lesion. Sinuses/Orbits: No acute finding. Other: None IMPRESSION: Negative non contrasted CT appearance of the brain Electronically Signed   By: Donavan Foil M.D.   On: 04/25/2022 22:20    Microbiology: Recent Results (from the past 240 hour(s))  Culture, blood (Routine X 2) w Reflex to ID Panel     Status: None (Preliminary result)   Collection Time: 05/18/22  4:10 AM   Specimen: BLOOD LEFT HAND  Result Value Ref Range Status   Specimen Description   Final    BLOOD LEFT HAND BOTTLES DRAWN AEROBIC AND ANAEROBIC   Special Requests Blood Culture adequate volume  Final   Culture   Final    NO GROWTH 3 DAYS Performed at [facility], [address] [place], [place] [address]    Report Status PENDING  Incomplete  Culture, blood (Routine X 2) w Reflex to ID Panel     Status: None (Preliminary result)   Collection Time: 05/18/22  4:10 AM   Specimen: BLOOD RIGHT HAND  Result Value Ref Range Status   Specimen Description   Final    BLOOD RIGHT HAND BOTTLES DRAWN AEROBIC AND ANAEROBIC   Special Requests Blood Culture adequate volume  Final   Culture   Final    NO GROWTH 3 DAYS Performed at [facility], [address] [place], [place] [address]    Report Status PENDING  Incomplete     Labs: Basic Metabolic Panel: Recent Labs  Lab 05/18/22 0011 05/18/22 0410 05/20/22 0319 05/21/22 0242  NA 134* 136 140 139  K 3.1* 3.4* 3.5 3.8  CL 101 104 107 111  CO2 24 24 20* 20*  GLUCOSE 107* 96 128* 98  BUN '12 10 10 17  '$ CREATININE 0.57 0.56 0.57 0.85  CALCIUM 9.0 8.6* 9.2 8.8*  MG 2.0  --   --  2.0  PHOS  --   --   --  4.1   Liver Function Tests: Recent Labs  Lab 05/18/22 0011 05/18/22 0410 05/21/22 0242  AST 16 12* 18  ALT '19 16 17  '$ ALKPHOS 89 75 74  BILITOT 0.4 0.6 0.4  PROT 7.4 6.2* 6.0*  ALBUMIN 3.9 3.3* 3.3*   No results for input(s): "LIPASE", "AMYLASE" in the last 168 hours. No results for  input(s): "AMMONIA" in the last 168 hours. CBC: Recent Labs  Lab 05/18/22 0011 05/18/22 0410 05/20/22 0319 05/21/22 0242  WBC 17.9* 17.6* 12.8* 14.1*  NEUTROABS 13.1*  --   --  9.7*  HGB 15.6* 14.2 14.4 13.6  HCT 44.5 42.0 42.3 39.7  MCV 84.3 85.5 85.1 86.9  PLT 264 273 274 282   Cardiac Enzymes: No results for input(s): "CKTOTAL", "CKMB", "CKMBINDEX", "TROPONINI" in the last 168 hours. BNP: BNP (last 3 results) No results for input(s): "BNP" in the last 8760 hours.  ProBNP (last 3 results) No results for input(s): "PROBNP" in the last 8760 hours.  CBG: No results for input(s): "GLUCAP" in the last 168 hours.     Signed:  Fayrene Helper MD.  [facility] 05/21/2022, 1:06 PM

## 2022-05-21 NOTE — Assessment & Plan Note (Signed)
Pretty extensive bruising at R groin site, but no clear hematoma Will continue to monitor for now on DAPT Ok for discharge to CIR, will need continued follow up

## 2022-05-21 NOTE — H&P (Signed)
Physical Medicine and Rehabilitation Admission H&P    CC: Functional deficits secondary to right MCA scattered infarcts  HPI: Diamond Adams is a 61 year old female who presented to the [facility] emergency department on 05/17/2022 after acute onset of dizziness a fall to the ground and complaining of numbness in all 4 extremities.  She also reported intermittent episodes of extremity weakness and paresthesias over the last 2 weeks.  Telemetry neurology consulted and the patient given aspirin. Work-up to rule in or out acute stroke.  She underwent CTA which showed carotid dissection and vascular surgery consulted.  Neurology advised to initiate Plavix.  She was transferred to [facility].  She was outside the window for tenecteplase.  She underwent EEG which suggested cortical dysfunction arising from right temporal region.  No seizures or elliptic form discharges.  MRI of the brain revealed right MCA scattered infarcts largest at the right frontoparietal region.  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  On 6/28, she underwent carotid angiogram by Dr. Virl Cagey.  Greater then 90% stenosis appreciated at the ostium of the right ICA.  She will be maintained on aspirin 325 mg daily and Plavix 75 mg daily with plans to proceed with right TCAR on 7/6 with Dr. Virl Cagey.  Right groin stable status post angiogram.  Reporting anxiety at nighttime and given alprazolam 0.25 mg nightly.  She is tolerating a regular diet and will be maintained on Plavix, aspirin and statin. The patient requires inpatient physical medicine and rehabilitation evaluations and treatment secondary to dysfunction due to right MCA territory scattered infarcts.  EF: 60-65% AIC: 5.4%  Peeing OK- using bathroom Pain 2/10 unless moves- 7/10 without meds from procedure for R groin.  LBM the day of admission- usually daily- denies feeling constipated.   Also having kaleidoscope colors in  vision and floaters and white flashing lights. Over the last month.      Lives with daughter. Helps with young grandchild during school year.  One pack of cigarettes every three days. No alcohol intake.  Review of Systems  Constitutional:  Negative for chills and fever.       Complains of generalized fatigue  HENT:  Negative for congestion and sore throat.   Eyes:  Negative for blurred vision and double vision.       Right field white flashes  Respiratory:  Negative for cough and sputum production.   Cardiovascular:  Positive for palpitations. Negative for chest pain and leg swelling.       Wakes up at night with palpitations  Gastrointestinal:  Positive for constipation. Negative for abdominal pain and vomiting.  Genitourinary:  Negative for dysuria and hematuria.  Musculoskeletal:  Negative for joint pain and neck pain.  Neurological:  Negative for dizziness and headaches.       Light-headedness  Psychiatric/Behavioral:  Positive for depression. The patient is nervous/anxious.        She uses exercise and non-medication strategies to treat  All other systems reviewed and are negative.  Past Medical History:  Diagnosis Date   Anxiety disorder    Depression    Fibromyalgia    Herpes    Hypertension    PONV (postoperative nausea and vomiting)    Past Surgical History:  Procedure Laterality Date   ABDOMINAL HYSTERECTOMY  1994   AORTIC ARCH ANGIOGRAPHY N/A 05/20/2022   Procedure: AORTIC ARCH ANGIOGRAPHY;  Surgeon: Broadus John, MD;  Location: [facility];  Service: Cardiovascular;  Laterality: N/A;   CAROTID ANGIOGRAPHY N/A 05/20/2022   Procedure: CAROTID ANGIOGRAPHY;  Surgeon: Broadus John, MD;  Location: [facility];  Service: Cardiovascular;  Laterality: N/A;   CARPAL TUNNEL RELEASE  2013,2011   right and left   EXCISION NASAL MASS Left 09/25/2013   Procedure: EXCISION NASAL CANCER WITH SPLIT THICKNESS SKIN GRAFT FROM RIGHT THIGH;  Surgeon: Ascencion Dike,  MD;  Location: [facility];  Service: ENT;  Laterality: Left;   RIGHT OOPHORECTOMY     THUMB ARTHROSCOPY     right   TONSILLECTOMY     TUBAL LIGATION     Family History  Problem Relation Age of Onset   Heart disease Father    Emphysema Mother    Cancer Sister        cervical   Social History:  reports that she has been smoking cigarettes. She has been smoking an average of .5 packs per day. She has never used smokeless tobacco. She reports that she does not drink alcohol and does not use drugs. Allergies:  Allergies  Allergen Reactions   Trazodone And Nefazodone Hives   Medications Prior to Admission  Medication Sig Dispense Refill   acetaminophen (TYLENOL) 500 MG tablet Take 500 mg by mouth every 6 (six) hours as needed for mild pain or moderate pain.     lisinopril-hydrochlorothiazide (ZESTORETIC) 20-25 MG tablet Take 1 tablet by mouth in the morning and at bedtime.     meclizine (ANTIVERT) 25 MG tablet Take 25 mg by mouth 2 (two) times daily as needed for dizziness.     metoprolol succinate (TOPROL-XL) 25 MG 24 hr tablet Take 12.5 mg by mouth daily.     pantoprazole (PROTONIX) 40 MG tablet Take 40 mg by mouth daily as needed (reflux).     pravastatin (PRAVACHOL) 40 MG tablet Take 40 mg by mouth daily.         Home:     Functional History:    Functional Status:  Mobility:          ADL:    Cognition:      Physical Exam: There were no vitals taken for this visit. Physical Exam Vitals and nursing note reviewed.  Constitutional:      General: She is not in acute distress.    Appearance: She is obese.     Comments: Pt 4 ft 10 inches tall- laying in bed supine; appears stated age, NAD  HENT:     Head: Normocephalic and atraumatic.     Comments: Smile equal Tongue midline    Right Ear: External ear normal.     Left Ear: External ear normal.     Nose: Nose normal. No congestion.     Mouth/Throat:     Mouth: Mucous membranes are dry.      Pharynx: Oropharynx is clear. No oropharyngeal exudate.  Eyes:     General:        Right eye: No discharge.        Left eye: No discharge.     Extraocular Movements: Extraocular movements intact.     Comments: L eye a little smaller compared to R No nystagmus B/L   Cardiovascular:     Rate and Rhythm: Normal rate and regular rhythm.     Heart sounds: Normal heart sounds. No murmur heard.    No gallop.  Pulmonary:     Effort: Pulmonary effort is normal. No respiratory distress.  Breath sounds: Normal breath sounds. No wheezing, rhonchi or rales.  Abdominal:     General: There is no distension.     Palpations: Abdomen is soft.     Tenderness: There is no abdominal tenderness.     Comments: Slightly hypoactive  Musculoskeletal:     Cervical back: Neck supple. No tenderness.     Comments: Right groin puncture site with non-expanding hematoma. Soft to palpation. + ecchymosis medially In groin, upper thigh and lower RLQ- all purple bruising RUE 5/5 LUE 5-/5 throughout RLE_ 5-/5 proximally and 5/5 distally- due to pain LLE- 5-/5 throughout  Skin:    General: Skin is warm and dry.     Comments: Right AC PIV   Neurological:     Mental Status: She is alert.     Comments: Intact to light touch in face and in all 4 limbs  Psychiatric:        Mood and Affect: Mood normal.        Behavior: Behavior normal.     Results for orders placed or performed during the hospital encounter of 05/17/22 (from the past 48 hour(s))  CBC     Status: Abnormal   Collection Time: 05/20/22  3:19 AM  Result Value Ref Range   WBC 12.8 (H) 4.0 - 10.5 K/uL   RBC 4.97 3.87 - 5.11 MIL/uL   Hemoglobin 14.4 12.0 - 15.0 g/dL   HCT 42.3 36.0 - 46.0 %   MCV 85.1 80.0 - 100.0 fL   MCH 29.0 26.0 - 34.0 pg   MCHC 34.0 30.0 - 36.0 g/dL   RDW 12.9 11.5 - 15.5 %   Platelets 274 150 - 400 K/uL   nRBC 0.0 0.0 - 0.2 %    Comment: Performed at [facility], [place] [address] [place], [place] [address]  Basic  metabolic panel     Status: Abnormal   Collection Time: 05/20/22  3:19 AM  Result Value Ref Range   Sodium 140 135 - 145 mmol/L   Potassium 3.5 3.5 - 5.1 mmol/L   Chloride 107 98 - 111 mmol/L   CO2 20 (L) 22 - 32 mmol/L   Glucose, Bld 128 (H) 70 - 99 mg/dL    Comment: Glucose reference range applies only to samples taken after fasting for at least 8 hours.   BUN 10 6 - 20 mg/dL   Creatinine, Ser 0.57 0.44 - 1.00 mg/dL   Calcium 9.2 8.9 - 10.3 mg/dL   GFR, Estimated >60 >60 mL/min    Comment: (NOTE) Calculated using the CKD-EPI Creatinine Equation (2021)    Anion gap 13 5 - 15    Comment: Performed at [facility] [address] [place], [place] [address]  CBC with Differential/Platelet     Status: Abnormal   Collection Time: 05/21/22  2:42 AM  Result Value Ref Range   WBC 14.1 (H) 4.0 - 10.5 K/uL   RBC 4.57 3.87 - 5.11 MIL/uL   Hemoglobin 13.6 12.0 - 15.0 g/dL   HCT 39.7 36.0 - 46.0 %   MCV 86.9 80.0 - 100.0 fL   MCH 29.8 26.0 - 34.0 pg   MCHC 34.3 30.0 - 36.0 g/dL   RDW 13.2 11.5 - 15.5 %   Platelets 282 150 - 400 K/uL   nRBC 0.0 0.0 - 0.2 %   Neutrophils Relative % 68 %   Neutro Abs 9.7 (H) 1.7 - 7.7 K/uL   Lymphocytes Relative 22 %   Lymphs  Abs 3.2 0.7 - 4.0 K/uL   Monocytes Relative 7 %   Monocytes Absolute 1.0 0.1 - 1.0 K/uL   Eosinophils Relative 1 %   Eosinophils Absolute 0.2 0.0 - 0.5 K/uL   Basophils Relative 1 %   Basophils Absolute 0.1 0.0 - 0.1 K/uL   Immature Granulocytes 1 %   Abs Immature Granulocytes 0.09 (H) 0.00 - 0.07 K/uL    Comment: Performed at [facility] [address] [place], [place] [address]  Comprehensive metabolic panel     Status: Abnormal   Collection Time: 05/21/22  2:42 AM  Result Value Ref Range   Sodium 139 135 - 145 mmol/L   Potassium 3.8 3.5 - 5.1 mmol/L   Chloride 111 98 - 111 mmol/L   CO2 20 (L) 22 - 32 mmol/L   Glucose, Bld 98 70 - 99 mg/dL    Comment: Glucose reference range applies only to samples taken  after fasting for at least 8 hours.   BUN 17 6 - 20 mg/dL   Creatinine, Ser 0.85 0.44 - 1.00 mg/dL   Calcium 8.8 (L) 8.9 - 10.3 mg/dL   Total Protein 6.0 (L) 6.5 - 8.1 g/dL   Albumin 3.3 (L) 3.5 - 5.0 g/dL   AST 18 15 - 41 U/L   ALT 17 0 - 44 U/L   Alkaline Phosphatase 74 38 - 126 U/L   Total Bilirubin 0.4 0.3 - 1.2 mg/dL   GFR, Estimated >60 >60 mL/min    Comment: (NOTE) Calculated using the CKD-EPI Creatinine Equation (2021)    Anion gap 8 5 - 15    Comment: Performed at [facility], [place] [address] [place], [place] [address]  Phosphorus     Status: None   Collection Time: 05/21/22  2:42 AM  Result Value Ref Range   Phosphorus 4.1 2.5 - 4.6 mg/dL    Comment: Performed at [facility] [address] [place], [place] [address]  Magnesium     Status: None   Collection Time: 05/21/22  2:42 AM  Result Value Ref Range   Magnesium 2.0 1.7 - 2.4 mg/dL    Comment: Performed at [facility] [address] [place], [place] [address]   PERIPHERAL VASCULAR CATHETERIZATION  Result Date: 05/20/2022 Images from the original result were not included.   Patient name: JOLEAN MADARIAGA   MRN: [id number]        DOB: 1961/04/05          Sex: female  05/20/2022 Pre-operative Diagnosis: Symptomatic right ICA stenosis Post-operative diagnosis:  Same Surgeon:  Broadus John, MD Procedure Performed: 1.  Ultrasound-guided micropuncture access of the right common femoral artery 2.  Aortic arch angiogram 3.  Selective cannulation of the right common carotid artery 4.  Carotid angiogram 5.  Device assisted closure-Mynx 6.  Moderate sedation-30 minutes  Indications: Patient is a 61 year old female who presented with strokelike symptoms 2 days ago with left-sided weakness.  Imaging demonstrated severe ostial stenosis of the right internal carotid artery with concern for distal occlusion.  After discussing the risk and benefits of cerebral angiogram in an effort to define patency for possible  transcarotid artery revascularization, Alexandra elected to proceed.   Findings: Aortic arch angiogram: Type III aortic arch.  No flow-limiting stenosis appreciated in first-order arch branches.  Widely patent bilateral subclavian arteries, bilateral vertebral arteries. Greater than 90% stenosis appreciated at the ostia of the right internal carotid artery.  The internal  carotid arteries patent throughout.  The anterior cerebral artery is not appreciated.  Middle cerebral artery fills.              Procedure:  The patient was identified in the holding area and taken to room 8.  The patient was then placed supine on the table and prepped and draped in the usual sterile fashion.  A time out was called.  Ultrasound was used to evaluate the right common femoral artery.  It was patent .  A digital ultrasound image was acquired.  A micropuncture needle was used to access the right common femoral artery under ultrasound guidance.  An 018 wire was advanced without resistance and a micropuncture sheath was placed.  The patient was heparinized and a 0.035 wire was advanced into the ascending aorta followed by a flush pigtail catheter.  Arch angiogram followed.  See results above.  Next, a vertebral catheter was used to cannulate the innominate artery and further used to cannulate the right common carotid artery.  Carotid artery angiography followed in both anterior and lateral views to evaluate ostial stenosis as well as cerebral filling.  See results above. The groin was closed using a minx device.  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  The right ICA is patent, filling the right middle cerebral artery.  The right anterior communicating artery was not appreciated.    Cassandria Santee, MD [facility]: [phone]      There were no vitals taken for this visit.  Medical Problem List and Plan: 1. Functional deficits secondary to right scattered MCA  territory infarcts  -patient may  shower  -ELOS/Goals: 7-10 days- mod I 2.  Antithrombotics: -DVT/anticoagulation:  Pharmaceutical: Heparin  -antiplatelet therapy: Plavix, aspirin 3. Pain Management: Tylenol, oxycodone as needed 4. Mood/Sleep: LCSW to evaluate and provide emotional support  -antipsychotic agents: n/a 5. Neuropsych/cognition: This patient is capable of making decisions on her own behalf. 6. Skin/Wound Care: Routine skin care checks 7. Fluids/Electrolytes/Nutrition: I's and O's and follow-up chemistries 8: Right carotid artery stenosis: Planning right TCAR 7/6 9: History of depression: started on Prozac  10: Anxiety disorder: Continue alprazolam nightly 11: Hyperlipidemia: Continue Crestor (at home on Pravachol 40 mg daily) 12: Essential hypertension: holding home Zestoretic 20/25 q AS and qHS as well as Toprol XL 12.5 mg daily  13: Tobacco use disorder: Advised regarding cessation.  She does report cravings at this time. 14: GERD: Continue Protonix   I have personally performed a face to face diagnostic evaluation of this patient and formulated the key components of the plan.  Additionally, I have personally reviewed laboratory data, imaging studies, as well as relevant notes and concur with the physician assistant's documentation above.   The patient's status has not changed from the original H&P.  Any changes in documentation from the acute care chart have been noted above.      [name], PA-C 05/21/2022

## 2022-05-21 NOTE — Care Management Important Message (Signed)
Important Message  Patient Details  Name: CHYAN CARNERO MRN: [id number] Date of Birth: 04-May-1961   Medicare Important Message Given:  Yes     Shelda Altes 05/21/2022, 8:22 AM

## 2022-05-21 NOTE — Progress Notes (Addendum)
Inpatient Rehabilitation Admission Medication Review by a Pharmacist  A complete drug regimen review was completed for this patient to identify any potential clinically significant medication issues.  High Risk Drug Classes Is patient taking? Indication by Medication  Antipsychotic Yes Compazine - prn N/V  Anticoagulant Yes Sq heparin - VTE ppx  Antibiotic No   Opioid Yes Oxycodone - prn pain  Antiplatelet Yes ASA/Plavix - CVA  Hypoglycemics/insulin No   Vasoactive Medication No   Chemotherapy No   Other Yes Pantoprazole - GERD Crestor - HLD Fluoxetine, alprazolam - anxiety, mood     Type of Medication Issue Identified Description of Issue Recommendation(s)  Drug Interaction(s) (clinically significant)     Duplicate Therapy     Allergy     No Medication Administration End Date     Incorrect Dose     Additional Drug Therapy Needed     Significant med changes from prior encounter (inform family/care partners about these prior to discharge). Metoprolol / lisinopril/HCTZ currently on hold Resume if or when appropriate during rehab stay or at discharge  Other       Clinically significant medication issues were identified that warrant physician communication and completion of prescribed/recommended actions by midnight of the next day:  No   Pharmacist comments: None  Time spent performing this drug regimen review (minutes):  20 minutes   Tad Moore 05/21/2022 1:25 PM

## 2022-05-21 NOTE — Progress Notes (Addendum)
  Progress Note    05/21/2022 7:13 AM 1 Day Post-Op  Subjective:  right groin sore. Says also that she has been having panic attacks at night that are waking her up from sleep   Vitals:   05/20/22 2334 05/21/22 0403  BP: (!) 115/48 (!) 150/53  Pulse: 61 75  Resp: 18 17  Temp: 98 F (36.7 C) 98 F (36.7 C)  SpO2: 96% 98%   Physical Exam: General: well developed and well nourished Cardiac:  regular Lungs:  non labored Extremities:  R common femoral access site is soft, ecchymosis present. Palpable femoral pulse. Neurologic: alert and oriented  CBC    Component Value Date/Time   WBC 14.1 (H) 05/21/2022 0242   RBC 4.57 05/21/2022 0242   HGB 13.6 05/21/2022 0242   HCT 39.7 05/21/2022 0242   PLT 282 05/21/2022 0242   MCV 86.9 05/21/2022 0242   MCH 29.8 05/21/2022 0242   MCHC 34.3 05/21/2022 0242   RDW 13.2 05/21/2022 0242   LYMPHSABS 3.2 05/21/2022 0242   MONOABS 1.0 05/21/2022 0242   EOSABS 0.2 05/21/2022 0242   BASOSABS 0.1 05/21/2022 0242    BMET    Component Value Date/Time   NA 139 05/21/2022 0242   K 3.8 05/21/2022 0242   CL 111 05/21/2022 0242   CO2 20 (L) 05/21/2022 0242   GLUCOSE 98 05/21/2022 0242   BUN 17 05/21/2022 0242   CREATININE 0.85 05/21/2022 0242   CALCIUM 8.8 (L) 05/21/2022 0242   GFRNONAA >60 05/21/2022 0242   GFRAA  09/01/2009 1740    >60        The eGFR has been calculated using the MDRD equation. This calculation has not been validated in all clinical situations. eGFR's persistently <60 mL/min signify possible Chronic Kidney Disease.    INR No results found for: "INR"   Intake/Output Summary (Last 24 hours) at 05/21/2022 7829 Last data filed at 05/20/2022 1800 Gross per 24 hour  Intake 916.14 ml  Output 200 ml  Net 716.14 ml     Assessment/Plan:  61 y.o. female is s/p carotid angiogram 1 Day Post-Op   Patient has high grade > 90% right ICA stenosis No new neurological symptoms Continue Aspirin, Statin,  Plavix Adequate blood pressure control Plan is for Right TCAR next week on 7/6 with Dr. Caralee Ates, PA-C [facility] [phone] 05/21/2022 7:13 AM  I have seen and evaluated the patient. I agree with the PA note as documented above.  Plan right TCAR next week on 7/6 with Dr. Virl Cagey.  She wanted an earlier surgery date which we accommodated.  Continue aspirin Plavix statin until surgery.  Grossly neurologically intact.  Carotid duplex yesterday showed that her ICA on the right is patent with a high-grade stenosis.  Some ecchymosis to the right groin but no big hematoma.  Marty Heck, MD [facility] Office: [phone]

## 2022-05-21 NOTE — Progress Notes (Signed)
Admitted to CIR today. Orientated to unit and assigned room. Admission and Assessments complete.   Yehuda Mao, LPN

## 2022-05-21 NOTE — Progress Notes (Signed)
PMR Admission Coordinator Pre-Admission Assessment  Patient: Diamond Adams is an 61 y.o., female MRN: [id number] DOB: 01/29/1961 Height: 4' 10" (147.3 cm) Weight: 59.9 kg  Insurance Information HMO: yes    PPO:      PCP:      IPA:      80/20:      OTHER:  PRIMARY: [employer] medicare      Policy#: [id number]      Subscriber: pt CM Name:       Phone#: [phone]     Fax#: [phone] Pre-Cert#: [id number] auth for CIR from [employer] with updates due to fax listed above on 7/5      Employer:  Benefits:  Phone #: [phone]     Name:  Eff. Date: 11/23/21     Deduct: $0      Out of Pocket Max: $8300 (met $170.99)      Life Max:  CIR: $1556/admission      SNF: 20 full days  Outpatient: 80%     Co-Pay: 20% Home Health: 100%      Co-Pay:  DME: 80%     Co-Pay: 20% Providers:  SECONDARY:       Policy#:      Phone#:   Financial Counselor:       Phone#:   The "Data Collection Information Summary" for patients in Inpatient Rehabilitation Facilities with attached "Privacy Act Statement-Health Care Records" was provided and verbally reviewed with: Patient  Emergency Contact Information Contact Information     Name Relation Home Work Mobile   Lewis,Amanda Daughter [phone]         Current Medical History  Patient Admitting Diagnosis: CVA History of Present Illness:  Pt is a 61 y/o F with PMH consisting of  anxiety disorder, depression, fibromyalgia, hypertension, and more presenting to [facility] ED on 6/25 after fall with dizziness/numbness in all extremities and blurred vision. 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 Reconstitution of the right ICA was noted at the distal petrous segment, likely due to collateral flow across the circle-of-Willis. No intracranial arterial occlusion or hemodynamically  significant stenosis was seen. Vascular not planning to perform surgery. EKG: Sinus rhythm. Abnormal R-wave progression, early transition CT head revealing area in R posterior parietal lobe consistent with evolving/subactue infarct. MRI revealing acute/subacture infarcts in R MCA distribution -- largest in parietal cortex. Patient transferred to [facility] 05/18/22. EEG: Intermittent slowing in the right temporal region. This finding is consistent with the stroke seen on CT. EKG: Sinus rhythm. Abnormal R-wave progression, early transition. Patient seen by PT, OT and Speech who recommend CIR to assist with return to prior level of function.    Complete NIHSS TOTAL: 0  Patient's medical record from  has been reviewed by the rehabilitation admission coordinator and physician.  Past Medical History  Past Medical History:  Diagnosis Date   Anxiety disorder    Depression    Fibromyalgia    Herpes    Hypertension    PONV (postoperative nausea and vomiting)     Has the patient had major surgery during 100 days prior to admission? No  Family History   family history includes Cancer in her sister; Emphysema in her mother; Heart disease in her father.  Current Medications  Current Facility-Administered Medications:    0.9 %  sodium chloride infusion, , Intravenous,   Continuous, Robins, Joshua E, MD, Last Rate: 100 mL/hr at 05/20/22 0553, New Bag at 05/20/22 0553   acetaminophen (TYLENOL) tablet 650 mg, 650 mg, Oral, Q4H PRN, 650 mg at 05/20/22 2212 **OR** [DISCONTINUED] acetaminophen (TYLENOL) 160 MG/5ML solution 650 mg, 650 mg, Per Tube, Q4H PRN **OR** acetaminophen (TYLENOL) suppository 650 mg, 650 mg, Rectal, Q4H PRN, Zierle-Ghosh, Asia B, DO   ALPRAZolam (XANAX) tablet 0.25 mg, 0.25 mg, Oral, QHS, Kumar, Pardeep, MD, 0.25 mg at 05/21/22 0026   aspirin EC tablet 325 mg, 325 mg, Oral, Daily, Xu, Jindong, MD, 325 mg at 05/21/22 1013   clopidogrel (PLAVIX) tablet 75 mg, 75 mg,  Oral, Daily, Zierle-Ghosh, Asia B, DO, 75 mg at 05/21/22 1013   FLUoxetine (PROZAC) capsule 10 mg, 10 mg, Oral, Daily, Zierle-Ghosh, Asia B, DO, 10 mg at 05/21/22 1013   heparin injection 5,000 Units, 5,000 Units, Subcutaneous, Q8H, Zierle-Ghosh, Asia B, DO, 5,000 Units at 05/21/22 0620   hydrALAZINE (APRESOLINE) injection 10 mg, 10 mg, Intravenous, Q6H PRN, Zierle-Ghosh, Asia B, DO   hydrALAZINE (APRESOLINE) injection 5 mg, 5 mg, Intravenous, Q20 Min PRN, Robins, Joshua E, MD   labetalol (NORMODYNE) injection 10 mg, 10 mg, Intravenous, Q10 min PRN, Robins, Joshua E, MD   morphine (PF) 2 MG/ML injection 2 mg, 2 mg, Intravenous, Q4H PRN, Powell, A Caldwell Jr., MD, 2 mg at 05/20/22 1407   ondansetron (ZOFRAN) injection 4 mg, 4 mg, Intravenous, Q6H PRN, Zierle-Ghosh, Asia B, DO, 4 mg at 05/19/22 1211   ondansetron (ZOFRAN) injection 4 mg, 4 mg, Intravenous, Q6H PRN, Robins, Joshua E, MD   oxyCODONE (Oxy IR/ROXICODONE) immediate release tablet 5 mg, 5 mg, Oral, Q4H PRN, Powell, A Caldwell Jr., MD, 5 mg at 05/21/22 1013   pantoprazole (PROTONIX) EC tablet 40 mg, 40 mg, Oral, Daily, Xu, Jindong, MD, 40 mg at 05/21/22 1013   rosuvastatin (CRESTOR) tablet 20 mg, 20 mg, Oral, Daily, Xu, Jindong, MD, 20 mg at 05/21/22 1013   senna-docusate (Senokot-S) tablet 1 tablet, 1 tablet, Oral, QHS PRN, Zierle-Ghosh, Asia B, DO  Patients Current Diet:  Diet Order             Diet regular Room service appropriate? Yes; Fluid consistency: Thin  Diet effective now                   Precautions / Restrictions Precautions Precautions: Fall Restrictions Weight Bearing Restrictions: No   Has the patient had 2 or more falls or a fall with injury in the past year? No  Prior Activity Level Household: home ambulation without AD prior Limited Community (1-2x/wk): ambulated outside of home for MD appointments  Prior Functional Level Self Care: Did the patient need help bathing, dressing, using the toilet or  eating? Independent  Indoor Mobility: Did the patient need assistance with walking from room to room (with or without device)? Independent  Stairs: Did the patient need assistance with internal or external stairs (with or without device)? Independent  Functional Cognition: Did the patient need help planning regular tasks such as shopping or remembering to take medications? Independent  Patient Information Are you of Hispanic, Latino/a,or Spanish origin?: A. No, not of Hispanic, Latino/a, or Spanish origin What is your race?: A. White Do you need or want an interpreter to communicate with a doctor or health care staff?: 0. No  Patient's Response To:  Health Literacy and Transportation Is the patient able to respond to health literacy and transportation needs?: Yes Health Literacy - How often   do you need to have someone help you when you read instructions, pamphlets, or other written material from your doctor or pharmacy?: Sometimes In the past 12 months, has lack of transportation kept you from medical appointments or from getting medications?: Yes In the past 12 months, has lack of transportation kept you from meetings, work, or from getting things needed for daily living?: Yes  Home Assistive Devices / Equipment Home Assistive Devices/Equipment: None Home Equipment: None  Prior Device Use: Indicate devices/aids used by the patient prior to current illness, exacerbation or injury? None of the above  Current Functional Level Cognition  Overall Cognitive Status: Impaired/Different from baseline Current Attention Level: Selective Orientation Level: Oriented X4 Following Commands: Follows one step commands with increased time, Follows one step commands consistently Attention: Focused, Sustained Focused Attention: Appears intact Sustained Attention: Appears intact Memory: Impaired Memory Impairment: Decreased short term memory Decreased Short Term Memory: Verbal basic Problem  Solving: Impaired Problem Solving Impairment: Functional basic Executive Function: Reasoning Reasoning: Appears intact    Extremity Assessment (includes Sensation/Coordination)  Upper Extremity Assessment: Defer to OT evaluation  Lower Extremity Assessment: Generalized weakness, Overall WFL for tasks assessed, LLE deficits/detail (>4/5) LLE Deficits / Details: mild incoordination with gait.    ADLs  Overall ADL's : Needs assistance/impaired Eating/Feeding: Set up, Sitting Grooming: Min guard, Sitting Upper Body Bathing: Minimal assistance, Sitting Lower Body Bathing: Minimal assistance, Sitting/lateral leans Upper Body Dressing : Minimal assistance, Sitting Lower Body Dressing: Minimal assistance, Sitting/lateral leans, Sit to/from stand Toilet Transfer: Min guard, Rolling walker (2 wheels) Toileting- Clothing Manipulation and Hygiene: Min guard, Sitting/lateral lean Functional mobility during ADLs: Min guard, Rolling walker (2 wheels)    Mobility  Overal bed mobility: Needs Assistance Bed Mobility: Supine to Sit, Sit to Supine Supine to sit: Supervision Sit to supine: Supervision    Transfers  Overall transfer level: Needs assistance Equipment used: Rolling walker (2 wheels) Transfers: Sit to/from Stand Sit to Stand: Min guard, Supervision    Ambulation / Gait / Stairs / Wheelchair Mobility  Ambulation/Gait Ambulation/Gait assistance: Min guard Gait Distance (Feet): 20 Feet (x2) Assistive device: Rolling walker (2 wheels) Gait Pattern/deviations: Step-to pattern, Decreased step length - right, Decreased step length - left, Decreased stride length General Gait Details: L LE mildly uncoordinated with swing through and stance, Generally appears for guarded than unsteady Gait velocity interpretation: <1.8 ft/sec, indicate of risk for recurrent falls    Posture / Balance Balance Overall balance assessment: Needs assistance Sitting-balance support: Feet supported, Bilateral  upper extremity supported Sitting balance-Leahy Scale: Good Standing balance support: During functional activity, Reliant on assistive device for balance Standing balance-Leahy Scale: Fair Standing balance comment: can static stand without UE support to pull up pants and wash hands at the sink    Special needs/care consideration Special service needs plan TCAR with [facility] on 7/6   Previous Home Environment (from acute therapy documentation) Living Arrangements: Children (daughter works outside the home, job doesn't allow working from home.)  Lives With: Daughter Available Help at Discharge: Available PRN/intermittently Type of Home: House Home Layout: Two level, Able to live on main level with bedroom/bathroom Home Access: Stairs to enter Entrance Stairs-Number of Steps: 3 Bathroom Shower/Tub: Tub/shower unit Bathroom Toilet: Handicapped height Home Care Services: No  Discharge Living Setting Plans for Discharge Living Setting: House, Lives with (comment) (daughter) Type of Home at Discharge: House Discharge Home Layout: Two level, Able to live on main level with bedroom/bathroom Alternate Level Stairs-Number of Steps: flight Discharge Home Access:   Stairs to enter Entrance Stairs-Rails: None Entrance Stairs-Number of Steps: 3 Discharge Bathroom Shower/Tub: Tub/shower unit Discharge Bathroom Toilet: Handicapped height Does the patient have any problems obtaining your medications?: No  Social/Family/Support Systems Patient Roles: Parent, Caregiver (watches grandchildren) Anticipated Caregiver: Daughter, Amanda Lewis Anticipated Caregiver's Contact Information: [phone] Ability/Limitations of Caregiver: Daughter works Caregiver Availability: Intermittent  Goals Patient/Family Goal for Rehab: Mod I PT/OT, SLP supervision Expected length of stay: 5-7 days  Barriers to Discharge: Lack of/limited family support, Insurance for SNF coverage  Decrease burden of Care through IP  rehab admission: Specialzed equipment needs, Decrease number of caregivers, and Patient/family education  Possible need for SNF placement upon discharge: Not anticipated  Patient Condition: I have reviewed medical records from [place], spoken with  TOC , and patient. I met with patient at the bedside for inpatient rehabilitation assessment.  Patient will benefit from ongoing PT, OT, and SLP, can actively participate in 3 hours of therapy a day 5 days of the week, and can make measurable gains during the admission.  Patient will also benefit from the coordinated team approach during an Inpatient Acute Rehabilitation admission.  The patient will receive intensive therapy as well as Rehabilitation physician, nursing, social worker, and care management interventions.  Due to safety, skin/wound care, disease management, medication administration, and patient education the patient requires 24 hour a day rehabilitation nursing.  The patient is currently min assist to min guard with mobility and basic ADLs.  Discharge setting and therapy post discharge at home with home health is anticipated.  Patient has agreed to participate in the Acute Inpatient Rehabilitation Program and will admit today.  Preadmission Screen Completed By: Kristyn Conetta, PT, with day of admit updates by  Aeriel Boulay E Nachelle Negrette, 05/21/2022 10:20 AM ______________________________________________________________________   Discussed status with Dr. Lovorn on 05/21/22  at 10:20 AM and received approval for admission today.  Admission Coordinator: Kristyn Conetta, PT, and Aisha Greenberger E Marium Ragan, PT, time 10:20 AM /Date 05/21/22    Assessment/Plan: Diagnosis: Does the need for close, 24 hr/day Medical supervision in concert with the patient's rehab needs make it unreasonable for this patient to be served in a less intensive setting? Yes Co-Morbidities requiring supervision/potential complications: FMS, anxiety, depression, CVA, carotid dissection Due  to bladder management, bowel management, safety, skin/wound care, disease management, medication administration, pain management, and patient education, does the patient require 24 hr/day rehab nursing? Yes Does the patient require coordinated care of a physician, rehab nurse, PT, OT, and SLP to address physical and functional deficits in the context of the above medical diagnosis(es)? Yes Addressing deficits in the following areas: balance, endurance, locomotion, strength, transferring, bowel/bladder control, bathing, dressing, feeding, grooming, toileting, cognition, and speech Can the patient actively participate in an intensive therapy program of at least 3 hrs of therapy 5 days a week? Yes The potential for patient to make measurable gains while on inpatient rehab is good Anticipated functional outcomes upon discharge from inpatient rehab: modified independent and supervision PT, modified independent and supervision OT, supervision SLP Estimated rehab length of stay to reach the above functional goals is: 5-7 days Anticipated discharge destination: Home 10. Overall Rehab/Functional Prognosis: good   MD Signature:    of stay to reach the above functional goals is: 5-7 days Anticipated discharge destination: Home 10. Overall Rehab/Functional Prognosis: good     MD Signature:

## 2022-05-21 NOTE — Progress Notes (Signed)
Inpatient Rehab Admissions Coordinator:    I have insurance approval and a bed available for pt to admit to CIR today. Dr. Florene Glen in agreement.  Will let pt/family and TOC team know.   Shann Medal, PT, DPT Admissions Coordinator [phone] 05/21/22  10:12 AM

## 2022-05-21 NOTE — H&P (Signed)
Physical Medicine and Rehabilitation Admission H&P     CC: Functional deficits secondary to right MCA scattered infarcts   HPI: Diamond Adams is a 61 year old female who presented to the [facility] emergency department on 05/17/2022 after acute onset of dizziness a fall to the ground and complaining of numbness in all 4 extremities.  She also reported intermittent episodes of extremity weakness and paresthesias over the last 2 weeks.  Telemetry neurology consulted and the patient given aspirin. Work-up to rule in or out acute stroke.  She underwent CTA which showed carotid dissection and vascular surgery consulted.  Neurology advised to initiate Plavix.  She was transferred to [facility].  She was outside the window for tenecteplase.  She underwent EEG which suggested cortical dysfunction arising from right temporal region.  No seizures or elliptic form discharges.  MRI of the brain revealed right MCA scattered infarcts largest at the right frontoparietal region.  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  On 6/28, she underwent carotid angiogram by Dr. Virl Cagey.  Greater then 90% stenosis appreciated at the ostium of the right ICA.  She will be maintained on aspirin 325 mg daily and Plavix 75 mg daily with plans to proceed with right TCAR on 7/6 with Dr. Virl Cagey.  Right groin stable status post angiogram.  Reporting anxiety at nighttime and given alprazolam 0.25 mg nightly.  She is tolerating a regular diet and will be maintained on Plavix, aspirin and statin. The patient requires inpatient physical medicine and rehabilitation evaluations and treatment secondary to dysfunction due to right MCA territory scattered infarcts.   EF: 60-65% AIC: 5.4%   Peeing OK- using bathroom Pain 2/10 unless moves- 7/10 without meds from procedure for R groin.  LBM the day of admission- usually daily- denies feeling constipated.    Also having kaleidoscope  colors in vision and floaters and white flashing lights. Over the last month.          Lives with daughter. Helps with young grandchild during school year.   One pack of cigarettes every three days. No alcohol intake.   Review of Systems  Constitutional:  Negative for chills and fever.       Complains of generalized fatigue  HENT:  Negative for congestion and sore throat.   Eyes:  Negative for blurred vision and double vision.       Right field white flashes  Respiratory:  Negative for cough and sputum production.   Cardiovascular:  Positive for palpitations. Negative for chest pain and leg swelling.       Wakes up at night with palpitations  Gastrointestinal:  Positive for constipation. Negative for abdominal pain and vomiting.  Genitourinary:  Negative for dysuria and hematuria.  Musculoskeletal:  Negative for joint pain and neck pain.  Neurological:  Negative for dizziness and headaches.       Light-headedness  Psychiatric/Behavioral:  Positive for depression. The patient is nervous/anxious.        She uses exercise and non-medication strategies to treat  All other systems reviewed and are negative.       Past Medical History:  Diagnosis Date   Anxiety disorder     Depression     Fibromyalgia     Herpes     Hypertension     PONV (postoperative nausea and vomiting)           Past Surgical History:  Procedure Laterality Date  ABDOMINAL HYSTERECTOMY   1994   AORTIC ARCH ANGIOGRAPHY N/A 05/20/2022    Procedure: AORTIC ARCH ANGIOGRAPHY;  Surgeon: Broadus John, MD;  Location: [facility];  Service: Cardiovascular;  Laterality: N/A;   CAROTID ANGIOGRAPHY N/A 05/20/2022    Procedure: CAROTID ANGIOGRAPHY;  Surgeon: Broadus John, MD;  Location: [facility];  Service: Cardiovascular;  Laterality: N/A;   CARPAL TUNNEL RELEASE   2013,2011    right and left   EXCISION NASAL MASS Left 09/25/2013    Procedure: EXCISION NASAL CANCER WITH SPLIT THICKNESS SKIN  GRAFT FROM RIGHT THIGH;  Surgeon: Ascencion Dike, MD;  Location: [facility];  Service: ENT;  Laterality: Left;   RIGHT OOPHORECTOMY       THUMB ARTHROSCOPY        right   TONSILLECTOMY       TUBAL LIGATION             Family History  Problem Relation Age of Onset   Heart disease Father     Emphysema Mother     Cancer Sister          cervical    Social History:  reports that she has been smoking cigarettes. She has been smoking an average of .5 packs per day. She has never used smokeless tobacco. She reports that she does not drink alcohol and does not use drugs. Allergies:      Allergies  Allergen Reactions   Trazodone And Nefazodone Hives          Medications Prior to Admission  Medication Sig Dispense Refill   acetaminophen (TYLENOL) 500 MG tablet Take 500 mg by mouth every 6 (six) hours as needed for mild pain or moderate pain.       lisinopril-hydrochlorothiazide (ZESTORETIC) 20-25 MG tablet Take 1 tablet by mouth in the morning and at bedtime.       meclizine (ANTIVERT) 25 MG tablet Take 25 mg by mouth 2 (two) times daily as needed for dizziness.       metoprolol succinate (TOPROL-XL) 25 MG 24 hr tablet Take 12.5 mg by mouth daily.       pantoprazole (PROTONIX) 40 MG tablet Take 40 mg by mouth daily as needed (reflux).       pravastatin (PRAVACHOL) 40 MG tablet Take 40 mg by mouth daily.               Home:   Functional History:   Functional Status:  Mobility:   ADL:   Cognition:   Physical Exam: There were no vitals taken for this visit. Physical Exam Vitals and nursing note reviewed.  Constitutional:      General: She is not in acute distress.    Appearance: She is obese.     Comments: Pt 4 ft 10 inches tall- laying in bed supine; appears stated age, NAD  HENT:     Head: Normocephalic and atraumatic.     Comments: Smile equal Tongue midline    Right Ear: External ear normal.     Left Ear: External ear normal.     Nose: Nose normal. No  congestion.     Mouth/Throat:     Mouth: Mucous membranes are dry.     Pharynx: Oropharynx is clear. No oropharyngeal exudate.  Eyes:     General:        Right eye: No discharge.        Left eye: No discharge.     Extraocular Movements:  Extraocular movements intact.     Comments: L eye a little smaller compared to R No nystagmus B/L   Cardiovascular:     Rate and Rhythm: Normal rate and regular rhythm.     Heart sounds: Normal heart sounds. No murmur heard.    No gallop.  Pulmonary:     Effort: Pulmonary effort is normal. No respiratory distress.     Breath sounds: Normal breath sounds. No wheezing, rhonchi or rales.  Abdominal:     General: There is no distension.     Palpations: Abdomen is soft.     Tenderness: There is no abdominal tenderness.     Comments: Slightly hypoactive  Musculoskeletal:     Cervical back: Neck supple. No tenderness.     Comments: Right groin puncture site with non-expanding hematoma. Soft to palpation. + ecchymosis medially In groin, upper thigh and lower RLQ- all purple bruising RUE 5/5 LUE 5-/5 throughout RLE_ 5-/5 proximally and 5/5 distally- due to pain LLE- 5-/5 throughout  Skin:    General: Skin is warm and dry.     Comments: Right AC PIV   Neurological:     Mental Status: She is alert.     Comments: Intact to light touch in face and in all 4 limbs  Psychiatric:        Mood and Affect: Mood normal.        Behavior: Behavior normal.        Lab Results Last 48 Hours        Results for orders placed or performed during the hospital encounter of 05/17/22 (from the past 48 hour(s))  CBC     Status: Abnormal    Collection Time: 05/20/22  3:19 AM  Result Value Ref Range    WBC 12.8 (H) 4.0 - 10.5 K/uL    RBC 4.97 3.87 - 5.11 MIL/uL    Hemoglobin 14.4 12.0 - 15.0 g/dL    HCT 42.3 36.0 - 46.0 %    MCV 85.1 80.0 - 100.0 fL    MCH 29.0 26.0 - 34.0 pg    MCHC 34.0 30.0 - 36.0 g/dL    RDW 12.9 11.5 - 15.5 %    Platelets 274 150 - 400 K/uL     nRBC 0.0 0.0 - 0.2 %      Comment: Performed at [facility], [place] [address] [place], [place] [address]  Basic metabolic panel     Status: Abnormal    Collection Time: 05/20/22  3:19 AM  Result Value Ref Range    Sodium 140 135 - 145 mmol/L    Potassium 3.5 3.5 - 5.1 mmol/L    Chloride 107 98 - 111 mmol/L    CO2 20 (L) 22 - 32 mmol/L    Glucose, Bld 128 (H) 70 - 99 mg/dL      Comment: Glucose reference range applies only to samples taken after fasting for at least 8 hours.    BUN 10 6 - 20 mg/dL    Creatinine, Ser 0.57 0.44 - 1.00 mg/dL    Calcium 9.2 8.9 - 10.3 mg/dL    GFR, Estimated >60 >60 mL/min      Comment: (NOTE) Calculated using the CKD-EPI Creatinine Equation (2021)      Anion gap 13 5 - 15      Comment: Performed at [facility] [address], [place], [place] [address]  CBC with Differential/Platelet     Status: Abnormal    Collection Time:  05/21/22  2:42 AM  Result Value Ref Range    WBC 14.1 (H) 4.0 - 10.5 K/uL    RBC 4.57 3.87 - 5.11 MIL/uL    Hemoglobin 13.6 12.0 - 15.0 g/dL    HCT 39.7 36.0 - 46.0 %    MCV 86.9 80.0 - 100.0 fL    MCH 29.8 26.0 - 34.0 pg    MCHC 34.3 30.0 - 36.0 g/dL    RDW 13.2 11.5 - 15.5 %    Platelets 282 150 - 400 K/uL    nRBC 0.0 0.0 - 0.2 %    Neutrophils Relative % 68 %    Neutro Abs 9.7 (H) 1.7 - 7.7 K/uL    Lymphocytes Relative 22 %    Lymphs Abs 3.2 0.7 - 4.0 K/uL    Monocytes Relative 7 %    Monocytes Absolute 1.0 0.1 - 1.0 K/uL    Eosinophils Relative 1 %    Eosinophils Absolute 0.2 0.0 - 0.5 K/uL    Basophils Relative 1 %    Basophils Absolute 0.1 0.0 - 0.1 K/uL    Immature Granulocytes 1 %    Abs Immature Granulocytes 0.09 (H) 0.00 - 0.07 K/uL      Comment: Performed at [facility], [address], [place], [place] [address]  Comprehensive metabolic panel     Status: Abnormal    Collection Time: 05/21/22  2:42 AM  Result Value Ref Range    Sodium 139 135 - 145 mmol/L    Potassium 3.8 3.5 - 5.1  mmol/L    Chloride 111 98 - 111 mmol/L    CO2 20 (L) 22 - 32 mmol/L    Glucose, Bld 98 70 - 99 mg/dL      Comment: Glucose reference range applies only to samples taken after fasting for at least 8 hours.    BUN 17 6 - 20 mg/dL    Creatinine, Ser 0.85 0.44 - 1.00 mg/dL    Calcium 8.8 (L) 8.9 - 10.3 mg/dL    Total Protein 6.0 (L) 6.5 - 8.1 g/dL    Albumin 3.3 (L) 3.5 - 5.0 g/dL    AST 18 15 - 41 U/L    ALT 17 0 - 44 U/L    Alkaline Phosphatase 74 38 - 126 U/L    Total Bilirubin 0.4 0.3 - 1.2 mg/dL    GFR, Estimated >60 >60 mL/min      Comment: (NOTE) Calculated using the CKD-EPI Creatinine Equation (2021)      Anion gap 8 5 - 15      Comment: Performed at [facility], [place] [address] [place], [place] [address]  Phosphorus     Status: None    Collection Time: 05/21/22  2:42 AM  Result Value Ref Range    Phosphorus 4.1 2.5 - 4.6 mg/dL      Comment: Performed at [facility] [address], [place], [place] [address]  Magnesium     Status: None    Collection Time: 05/21/22  2:42 AM  Result Value Ref Range    Magnesium 2.0 1.7 - 2.4 mg/dL      Comment: Performed at [facility] [address] [place], [place] [address]       Imaging Results (Last 48 hours)  PERIPHERAL VASCULAR CATHETERIZATION   Result Date: 05/20/2022 Images from the original result were not included.   Patient name: JAZZMYNE RASNICK   MRN: [id number]  DOB: [birth year]          Sex: female  05/20/2022 Pre-operative Diagnosis: Symptomatic right ICA stenosis Post-operative diagnosis:  Same Surgeon:  Broadus John, MD Procedure Performed: 1.  Ultrasound-guided micropuncture access of the right common femoral artery 2.  Aortic arch angiogram 3.  Selective cannulation of the right common carotid artery 4.  Carotid angiogram 5.  Device assisted closure-Mynx 6.  Moderate sedation-30 minutes  Indications: Patient is a 61 year old female who presented with strokelike symptoms 2 days ago with left-sided  weakness.  Imaging demonstrated severe ostial stenosis of the right internal carotid artery with concern for distal occlusion.  After discussing the risk and benefits of cerebral angiogram in an effort to define patency for possible transcarotid artery revascularization, Brodie elected to proceed.   Findings: Aortic arch angiogram: Type III aortic arch.  No flow-limiting stenosis appreciated in first-order arch branches.  Widely patent bilateral subclavian arteries, bilateral vertebral arteries. Greater than 90% stenosis appreciated at the ostia of the right internal carotid artery.  The internal carotid arteries patent throughout.  The anterior cerebral artery is not appreciated.  Middle cerebral artery fills.              Procedure:  The patient was identified in the holding area and taken to room 8.  The patient was then placed supine on the table and prepped and draped in the usual sterile fashion.  A time out was called.  Ultrasound was used to evaluate the right common femoral artery.  It was patent .  A digital ultrasound image was acquired.  A micropuncture needle was used to access the right common femoral artery under ultrasound guidance.  An 018 wire was advanced without resistance and a micropuncture sheath was placed.  The patient was heparinized and a 0.035 wire was advanced into the ascending aorta followed by a flush pigtail catheter.  Arch angiogram followed.  See results above.  Next, a vertebral catheter was used to cannulate the innominate artery and further used to cannulate the right common carotid artery.  Carotid artery angiography followed in both anterior and lateral views to evaluate ostial stenosis as well as cerebral filling.  See results above. The groin was closed using a minx device.  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  The right ICA is patent, filling the right middle cerebral artery.  The right anterior communicating artery was not  appreciated.    Cassandria Santee, MD [facility]: [phone]          There were no vitals taken for this visit.   Medical Problem List and Plan: 1. Functional deficits secondary to right scattered MCA territory infarcts             -patient may  shower             -ELOS/Goals: 7-10 days- mod I 2.  Antithrombotics: -DVT/anticoagulation:  Pharmaceutical: Heparin             -antiplatelet therapy: Plavix, aspirin 3. Pain Management: Tylenol, oxycodone as needed 4. Mood/Sleep: LCSW to evaluate and provide emotional support             -antipsychotic agents: n/a 5. Neuropsych/cognition: This patient is capable of making decisions on her own behalf. 6. Skin/Wound Care: Routine skin care checks 7. Fluids/Electrolytes/Nutrition: I's and O's and follow-up chemistries 8: Right carotid artery stenosis: Planning right TCAR 7/6 9: History of depression: started on Prozac  10: Anxiety disorder: Continue alprazolam nightly 11: Hyperlipidemia: Continue Crestor (at home on Pravachol 40 mg daily) 12: Essential hypertension: holding home Zestoretic 20/25 q AS and qHS as well as Toprol XL 12.5 mg daily  13: Tobacco use disorder: Advised regarding cessation.  She does report cravings at this time. 14: GERD: Continue Protonix     I have personally performed a face to face diagnostic evaluation of this patient and formulated the key components of the plan.  Additionally, I have personally reviewed laboratory data, imaging studies, as well as relevant notes and concur with the physician assistant's documentation above.   The patient's status has not changed from the original H&P.  Any changes in documentation from the acute care chart have been noted above.         [name], PA-C 05/21/2022

## 2022-05-21 NOTE — Progress Notes (Signed)
Physical Therapy Treatment Patient Details Name: Diamond Adams MRN: [id number] DOB: 07-23-1961 Today's Date: 05/21/2022   History of Present Illness Pt is a 61 y/o F presenting to ED on 6/25 after fall with dizziness/numbness in all extremities and blurred vision. CTA showing carotid dissection, CT head revealing area in R posterior parietal lobe consistent with evolving/subactue infarct. MRI revealing acute/subacture infarcts in R MCA distribution -- largest in parietal cortex. PMH includes anxiety, depression, fibromyalgia, herpes, and HTN.    PT Comments    Patient progressing with ambulation distance and tolerance.  She did need more help for R LE management due to post-procedure pain with large area of bruising and edema with increased BOS and fatigue noted in L UE and LLE with buckling.  Patient will continue to benefit from skilled PT in the acute setting.  Continue to feel she will benefit from acute inpatient rehab stay prior to d/c home.    Recommendations for follow up therapy are one component of a multi-disciplinary discharge planning process, led by the attending physician.  Recommendations may be updated based on patient status, additional functional criteria and insurance authorization.  Follow Up Recommendations  Acute inpatient rehab (3hours/day)     Assistance Recommended at Discharge    Patient can return home with the following A little help with walking and/or transfers;A little help with bathing/dressing/bathroom;Assistance with cooking/housework;Assist for transportation;Help with stairs or ramp for entrance   Equipment Recommendations  Other (comment) (TBA)    Recommendations for Other Services       Precautions / Restrictions Precautions Precautions: Fall     Mobility  Bed Mobility Overal bed mobility: Needs Assistance Bed Mobility: Supine to Sit, Sit to Supine     Supine to sit: Min assist Sit to supine: Min assist   General bed mobility comments:  assist for R LE out of bed and into bed    Transfers Overall transfer level: Needs assistance Equipment used: Rolling walker (2 wheels) Transfers: Sit to/from Stand Sit to Stand: Min guard           General transfer comment: heavy UE support needed and increased time for sit to stand    Ambulation/Gait Ambulation/Gait assistance: Min guard Gait Distance (Feet): 60 Feet Assistive device: Rolling walker (2 wheels) Gait Pattern/deviations: Step-to pattern, Decreased stride length, Wide base of support       General Gait Details: R LE antalgia after procedure and incoordination L LE pt stopping twice to rest L arm due to fatigue and L LE buckling x 2 near the end of ambulation   Stairs             Wheelchair Mobility    Modified Rankin (Stroke Patients Only) Modified Rankin (Stroke Patients Only) Pre-Morbid Rankin Score: No symptoms Modified Rankin: Moderately severe disability     Balance Overall balance assessment: Needs assistance Sitting-balance support: Feet supported Sitting balance-Leahy Scale: Good     Standing balance support: No upper extremity supported Standing balance-Leahy Scale: Poor Standing balance comment: LOB when standing trying to use hand sanitzer after toileting min A to recover                            Cognition Arousal/Alertness: Awake/alert Behavior During Therapy: WFL for tasks assessed/performed Overall Cognitive Status: Impaired/Different from baseline Area of Impairment: Problem solving, Attention                   Current Attention Level:  Selective Memory: Decreased short-term memory Following Commands: Follows one step commands with increased time, Follows one step commands consistently     Problem Solving: Slow processing          Exercises      General Comments General comments (skin integrity, edema, etc.): VSS throughout.  Patient toileted in bathroom needing min A for balance during  hygiene and for don/doffing briefs      Pertinent Vitals/Pain Pain Assessment Faces Pain Scale: Hurts even more Pain Location: R groin with movement Pain Descriptors / Indicators: Guarding, Grimacing, Discomfort Pain Intervention(s): Monitored during session, Repositioned, Premedicated before session    Home Living                          Prior Function            PT Goals (current goals can now be found in the care plan section) Progress towards PT goals: Progressing toward goals    Frequency    Min 3X/week      PT Plan Current plan remains appropriate    Co-evaluation              AM-PAC PT "6 Clicks" Mobility   Outcome Measure  Help needed turning from your back to your side while in a flat bed without using bedrails?: A Little Help needed moving from lying on your back to sitting on the side of a flat bed without using bedrails?: A Little Help needed moving to and from a bed to a chair (including a wheelchair)?: A Little Help needed standing up from a chair using your arms (e.g., wheelchair or bedside chair)?: A Little Help needed to walk in hospital room?: A Little Help needed climbing 3-5 steps with a railing? : Total 6 Click Score: 16    End of Session Equipment Utilized During Treatment: Gait belt Activity Tolerance: Other (comment);Patient limited by fatigue Patient left: with call bell/phone within reach;in bed;with bed alarm set   PT Visit Diagnosis: Other abnormalities of gait and mobility (R26.89);Unsteadiness on feet (R26.81);Other symptoms and signs involving the nervous system (R29.898)     Time: 6256-3893 PT Time Calculation (min) (ACUTE ONLY): 25 min  Charges:  $Gait Training: 8-22 mins $Therapeutic Activity: 8-22 mins                     Magda Kiel, PT Acute Rehabilitation Services Pager:[phone] Office:[phone] 05/21/2022    Reginia Naas 05/21/2022, 12:25 PM

## 2022-05-21 NOTE — Progress Notes (Signed)
Occupational Therapy Treatment Patient Details Name: Diamond Adams MRN: [id number] DOB: 10-Jan-1961 Today's Date: 05/21/2022   History of present illness Pt is a 61 y/o F presenting to ED on 6/25 after fall with dizziness/numbness in all extremities and blurred vision. CTA showing carotid dissection, CT head revealing area in R posterior parietal lobe consistent with evolving/subactue infarct. MRI revealing acute/subacture infarcts in R MCA distribution -- largest in parietal cortex. PMH includes anxiety, depression, fibromyalgia, herpes, and HTN.   OT comments  Patient received in bed and agreeable to participate with OT for Pill box test. Patient declined performing sitting up on EOB or in chair due to pain. Functional cognition further assessed with The Pillbox Test: A Measure of Executive Functioning and Estimate of Medication Management. A straight pass/fail designation is determined by 3 or more errors of omission or misplacement on the task. The pt completed the test with less than 3 errors.  Patient required 6 minutes and 40 seconds to complete.      Recommendations for follow up therapy are one component of a multi-disciplinary discharge planning process, led by the attending physician.  Recommendations may be updated based on patient status, additional functional criteria and insurance authorization.    Follow Up Recommendations  Acute inpatient rehab (3hours/day)    Assistance Recommended at Discharge Set up Supervision/Assistance  Patient can return home with the following  Assistance with cooking/housework;A little help with walking and/or transfers;A little help with bathing/dressing/bathroom;Direct supervision/assist for medications management;Direct supervision/assist for financial management;Help with stairs or ramp for entrance;Assist for transportation   Equipment Recommendations  None recommended by OT;Other (comment)    Recommendations for Other Services      Precautions  / Restrictions Precautions Precautions: Fall Restrictions Weight Bearing Restrictions: No       Mobility Bed Mobility                    Transfers                         Balance                                           ADL either performed or assessed with clinical judgement   ADL                                              Extremity/Trunk Assessment              Vision       Perception     Praxis      Cognition Arousal/Alertness: Awake/alert Behavior During Therapy: WFL for tasks assessed/performed Overall Cognitive Status: Impaired/Different from baseline Area of Impairment: Problem solving, Attention                   Current Attention Level: Selective Memory: Decreased short-term memory Following Commands: Follows one step commands with increased time, Follows one step commands consistently     Problem Solving: Slow processing General Comments: performed pill test with passing results        Exercises      Shoulder Instructions       General Comments Patient seen for pillbox text while sitting up in bed  Pertinent Vitals/ Pain       Pain Assessment Pain Assessment: Faces Faces Pain Scale: Hurts even more Pain Location: R groin with movement Pain Descriptors / Indicators: Guarding, Grimacing, Discomfort Pain Intervention(s): Limited activity within patient's tolerance, Monitored during session  Home Living                                          Prior Functioning/Environment              Frequency  Min 2X/week        Progress Toward Goals  OT Goals(current goals can now be found in the care plan section)  Progress towards OT goals: Progressing toward goals  Acute Rehab OT Goals Patient Stated Goal: go to rehab OT Goal Formulation: With patient Time For Goal Achievement: 06/02/22 Potential to Achieve Goals: Good ADL Goals Pt Will  Perform Upper Body Dressing: with modified independence;standing;sitting Pt Will Perform Lower Body Dressing: with modified independence;sit to/from stand;sitting/lateral leans Pt Will Transfer to Toilet: with modified independence;ambulating;regular height toilet Pt Will Perform Tub/Shower Transfer: Shower transfer;Tub transfer;with modified independence;ambulating Additional ADL Goal #1: Pt will be able to complete standing task for 10 mins in order to improve activity tolerance for ADLs  Plan Discharge plan remains appropriate    Co-evaluation                 AM-PAC OT "6 Clicks" Daily Activity     Outcome Measure   Help from another person eating meals?: None Help from another person taking care of personal grooming?: A Little Help from another person toileting, which includes using toliet, bedpan, or urinal?: A Little Help from another person bathing (including washing, rinsing, drying)?: A Lot Help from another person to put on and taking off regular upper body clothing?: A Little Help from another person to put on and taking off regular lower body clothing?: A Little 6 Click Score: 18    End of Session    OT Visit Diagnosis: Unsteadiness on feet (R26.81);Other abnormalities of gait and mobility (R26.89);Muscle weakness (generalized) (M62.81);Low vision, both eyes (H54.2);Dizziness and giddiness (R42);Other symptoms and signs involving the nervous system (R29.898)   Activity Tolerance Patient tolerated treatment well   Patient Left in bed;with call bell/phone within reach;with bed alarm set   Nurse Communication Mobility status        Time: 4081-4481 OT Time Calculation (min): 16 min  Charges: OT General Charges $OT Visit: 1 Visit OT Treatments $Cognitive Funtion inital: Initial 15 mins  Lodema Hong, [place]  Office [phone]   Trixie Dredge 05/21/2022, 2:33 PM

## 2022-05-22 DIAGNOSIS — Z1321 Encounter for screening for nutritional disorder: Secondary | ICD-10-CM | POA: Diagnosis not present

## 2022-05-22 DIAGNOSIS — I63511 Cerebral infarction due to unspecified occlusion or stenosis of right middle cerebral artery: Secondary | ICD-10-CM | POA: Diagnosis not present

## 2022-05-22 DIAGNOSIS — I10 Essential (primary) hypertension: Secondary | ICD-10-CM | POA: Diagnosis not present

## 2022-05-22 DIAGNOSIS — R1031 Right lower quadrant pain: Secondary | ICD-10-CM | POA: Diagnosis not present

## 2022-05-22 LAB — CBC WITH DIFFERENTIAL/PLATELET
Abs Immature Granulocytes: 0.08 10*3/uL — ABNORMAL HIGH (ref 0.00–0.07)
Basophils Absolute: 0.1 10*3/uL (ref 0.0–0.1)
Basophils Relative: 0 %
Eosinophils Absolute: 0.2 10*3/uL (ref 0.0–0.5)
Eosinophils Relative: 2 %
HCT: 39.8 % (ref 36.0–46.0)
Hemoglobin: 13.6 g/dL (ref 12.0–15.0)
Immature Granulocytes: 1 %
Lymphocytes Relative: 29 %
Lymphs Abs: 3.5 10*3/uL (ref 0.7–4.0)
MCH: 29.4 pg (ref 26.0–34.0)
MCHC: 34.2 g/dL (ref 30.0–36.0)
MCV: 86.1 fL (ref 80.0–100.0)
Monocytes Absolute: 0.9 10*3/uL (ref 0.1–1.0)
Monocytes Relative: 7 %
Neutro Abs: 7.4 10*3/uL (ref 1.7–7.7)
Neutrophils Relative %: 61 %
Platelets: 249 10*3/uL (ref 150–400)
RBC: 4.62 MIL/uL (ref 3.87–5.11)
RDW: 13 % (ref 11.5–15.5)
WBC: 12.1 10*3/uL — ABNORMAL HIGH (ref 4.0–10.5)
nRBC: 0 % (ref 0.0–0.2)

## 2022-05-22 LAB — COMPREHENSIVE METABOLIC PANEL
ALT: 17 U/L (ref 0–44)
AST: 17 U/L (ref 15–41)
Albumin: 3.4 g/dL — ABNORMAL LOW (ref 3.5–5.0)
Alkaline Phosphatase: 67 U/L (ref 38–126)
Anion gap: 8 (ref 5–15)
BUN: 13 mg/dL (ref 6–20)
CO2: 27 mmol/L (ref 22–32)
Calcium: 9 mg/dL (ref 8.9–10.3)
Chloride: 105 mmol/L (ref 98–111)
Creatinine, Ser: 0.65 mg/dL (ref 0.44–1.00)
GFR, Estimated: >60 mL/min (ref >60)
Glucose, Bld: 122 mg/dL — ABNORMAL HIGH (ref 70–99)
Potassium: 4 mmol/L (ref 3.5–5.1)
Sodium: 140 mmol/L (ref 135–145)
Total Bilirubin: 0.7 mg/dL (ref 0.3–1.2)
Total Protein: 6.3 g/dL — ABNORMAL LOW (ref 6.5–8.1)

## 2022-05-22 LAB — VITAMIN D 25 HYDROXY (VIT D DEFICIENCY, FRACTURES): Vit D, 25-Hydroxy: 36.76 ng/mL (ref 30–100)

## 2022-05-22 MED ORDER — ACETAMINOPHEN-CODEINE 300-30 MG PO TABS
1.0000 | ORAL_TABLET | Freq: Three times a day (TID) | ORAL | Status: DC | PRN
  Administered 2022-05-22: 1 via ORAL
  Filled 2022-05-22: qty 1

## 2022-05-22 MED ORDER — HYDROXYZINE HCL 10 MG PO TABS
10.0000 mg | ORAL_TABLET | Freq: Three times a day (TID) | ORAL | Status: DC | PRN
Start: 2022-05-22 — End: 2022-05-26
  Filled 2022-05-22: qty 1

## 2022-05-22 MED ORDER — MAGNESIUM GLUCONATE 500 MG PO TABS
250.0000 mg | ORAL_TABLET | Freq: Every day | ORAL | Status: DC
  Administered 2022-05-22 – 2022-05-25 (×4): 250 mg via ORAL
  Filled 2022-05-22 (×4): qty 1

## 2022-05-22 NOTE — IPOC Note (Signed)
Overall Plan of Care [facility]) Patient Details Name: Diamond Adams MRN: [id number] DOB: [birth year]  Admitting Diagnosis: Acute ischemic right MCA stroke [facility])  Hospital Problems: Principal Problem:   Acute ischemic right MCA stroke [facility]) Active Problems:   Acute right MCA stroke ([place])     Functional Problem List: Nursing Edema, Endurance, Medication Management, Motor, Pain, Safety  PT Balance, Motor, Endurance, Pain  OT Balance, Endurance, Motor, Pain  SLP  (N/A)  TR         Basic ADL's: OT Bathing, Dressing, Toileting     Advanced  ADL's: OT Light Housekeeping     Transfers: PT Bed Mobility, Bed to Chair, Teacher, early years/pre, Tub/Shower     Locomotion: PT Ambulation, Stairs     Additional Impairments: OT None  SLP  (N/A)      TR      Anticipated Outcomes Item Anticipated Outcome  Self Feeding no goal, pt is independent  Swallowing  N/A   Basic self-care  Mod I  Toileting  Mod I   Bathroom Transfers Mod I  Bowel/Bladder  n/a  Transfers  mod I  Locomotion  mod I  Communication  N/A  Cognition  N/A  Pain  < 3  Safety/Judgment  mod I   Therapy Plan: PT Intensity: Minimum of 1-2 x/day ,45 to 90 minutes PT Frequency: 5 out of 7 days PT Duration Estimated Length of Stay: 5-7 days OT Intensity: Minimum of 1-2 x/day, 45 to 90 minutes OT Frequency: 5 out of 7 days OT Duration/Estimated Length of Stay: 5-7 days SLP Intensity:  (N/A) SLP Frequency:  (N/A) SLP Duration/Estimated Length of Stay: N/A   Team Interventions: Nursing Interventions Patient/Family Education, Disease Management/Prevention, Pain Management, Medication Management, Discharge Planning  PT interventions Ambulation/gait training, Discharge planning, Functional mobility training, Psychosocial support, Therapeutic Activities, Visual/perceptual remediation/compensation, Therapeutic Exercise, Wheelchair propulsion/positioning, Neuromuscular re-education, Disease management/prevention,  Training and development officer, DME/adaptive equipment instruction, Pain management, UE/LE Strength taining/ROM, UE/LE Coordination activities, Stair training, Patient/family education, Community reintegration  OT Interventions Training and development officer, Discharge planning, DME/adaptive equipment instruction, Pain management, Patient/family education, Psychosocial support, Neuromuscular re-education, Self Care/advanced ADL retraining, Therapeutic Activities, Therapeutic Exercise, UE/LE Strength taining/ROM, UE/LE Coordination activities  SLP Interventions  (N/A)  TR Interventions    SW/CM Interventions Discharge Planning, Psychosocial Support, Patient/Family Education   Barriers to Discharge MD  Medical stability  Nursing Decreased caregiver support, Home environment access/layout, Lack of/limited family support 2 level, 3 steps, no rails. Main level bed/bath. Daughter to provide intermittent assist at discharge.  PT Lack of/limited family support, Insurance for SNF coverage, Weight    OT      SLP  (N/A)    SW Decreased caregiver support, Insurance underwriter for SNF coverage     Team Discharge Planning: Destination: PT-Home ,OT- Home , SLP-Home Projected Follow-up: PT-24 hour supervision/assistance, Outpatient PT, OT-  None, SLP-None Projected Equipment Needs: PT-To be determined, OT- Tub/shower seat, SLP-None recommended by SLP Equipment Details: PT- , OT-  Patient/family involved in discharge planning: PT- Patient,  OT-Patient, SLP-Patient  MD ELOS: 7-10 days Medical Rehab Prognosis:  Excellent Assessment: The patient has been admitted for CIR therapies with the diagnosis of acute right MCA stroke. The team will be addressing functional mobility, strength, stamina, balance, safety, adaptive techniques and equipment, self-care, bowel and bladder mgt, patient and caregiver education. Goals have been set at supervision. Anticipated discharge destination is home.         See Team Conference  Notes for weekly updates to the plan  of care

## 2022-05-22 NOTE — Evaluation (Addendum)
Speech Language Pathology Assessment and Plan  Patient Details  Name: Diamond Adams MRN: [id number] Date of Birth: 1960-12-10  SLP Diagnosis:  (N/A)  Rehab Potential:  (N/A) ELOS: N/A    Today's Date: 05/22/2022 SLP Individual Time: 9163-8466 SLP Individual Time Calculation (min): 32 min   Hospital Problem: Principal Problem:   Acute ischemic right MCA stroke ([place]) Active Problems:   Acute right MCA stroke [facility])  Past Medical History:  Past Medical History:  Diagnosis Date   Anxiety disorder    Depression    Fibromyalgia    Herpes    Hypertension    PONV (postoperative nausea and vomiting)    Past Surgical History:  Past Surgical History:  Procedure Laterality Date   ABDOMINAL HYSTERECTOMY  1994   AORTIC ARCH ANGIOGRAPHY N/A 05/20/2022   Procedure: AORTIC ARCH ANGIOGRAPHY;  Surgeon: Broadus John, MD;  Location: [place] CV LAB;  Service: Cardiovascular;  Laterality: N/A;   CAROTID ANGIOGRAPHY N/A 05/20/2022   Procedure: CAROTID ANGIOGRAPHY;  Surgeon: Broadus John, MD;  Location: [place] CV LAB;  Service: Cardiovascular;  Laterality: N/A;   CARPAL TUNNEL RELEASE  2013,2011   right and left   EXCISION NASAL MASS Left 09/25/2013   Procedure: EXCISION NASAL CANCER WITH SPLIT THICKNESS SKIN GRAFT FROM RIGHT THIGH;  Surgeon: Ascencion Dike, MD;  Location: [place];  Service: ENT;  Laterality: Left;   RIGHT OOPHORECTOMY     THUMB ARTHROSCOPY     right   TONSILLECTOMY     TUBAL LIGATION      Assessment / Plan / Recommendation Clinical Impression History of Present Illness:  Pt is a 61 y/o F with PMH consisting of  anxiety disorder, depression, fibromyalgia, hypertension, and more presenting to [facility] ED on 6/25 after fall with dizziness/numbness in all extremities and blurred vision. CTA showing carotid dissection, Vascular surgery consulted CTA was obtained which showed diffuse, severe narrowing of the right internal carotid artery with occlusion  proximal to the skull base, possibly secondary to carotid dissection. Reconstitution of the right ICA was noted at the distal petrous segment, likely due to collateral flow across the [place]. No intracranial arterial occlusion or hemodynamically significant stenosis was seen. Vascular not planning to perform surgery. EKG: Sinus rhythm. Abnormal R-wave progression, early transition CT head revealing area in R posterior parietal lobe consistent with evolving/subactue infarct. MRI revealing acute/subacture infarcts in R MCA distribution -- largest in parietal cortex. Patient transferred to [facility] 05/18/22. EEG: Intermittent slowing in the right temporal region. This finding is consistent with the stroke seen on CT. EKG: Sinus rhythm. Abnormal R-wave progression, early transition. Patient seen by PT, OT and Speech who recommend CIR to assist with return to prior level of function. Admitted to CIR on 05/21/2022.  Cognitive-linguistic assessment completed s/p R MCA CVA. Per cognitive-linguistic evaluation via SLUMS administration, informal assessment measures, and pt report, pt's cognitive-linguistic functioning appears to be performing at her cognitive baseline (baseline memory deficits reported with pt independently utilizing external aids for functional recall (e.g. applications on phone)). Pt scored 24/30 on SLUMS assessment (n = 25 for less than a high school education - pt finished school in 7th grade). Suspect this score was negatively impacted by pain; LPN provided medication prior to evaluation though pt reporting this did not help. SLP provided repositioning and distraction throughout for an additional layer of pain management. Pt reports her pain typically dissipates with movement and is looking forward to OT evaluation. Completed  medication management subtest from ALFA with 100% accuracy. Receptive and expressive language appears grossly intact for daily communication. Speech is fluent  and intelligible. Tolerating regular diet with thin liquids per chart review. Of note, pt reports baseline deficits in short-term memory and relies heavily on external aids, which she was successfully able to navigate on her phone. Asked appropriate questions re: her current medications, motor planning/apraxia prior to hospitalization, and her medical course. Denies changes in cognitive skills s/p R MCA CVA. Demonstrated functional recall of events that led up to hospitalization.  At this time, pt appears at baseline cognitive functioning; therefore, formal ST intervention does not appear indicated. Please see below for additional details.     Skilled Therapeutic Interventions          Cognitive-linguistic evaluation completed via administration of SLUMS, ALFA subtest, and informal assessment measures.   SLP Assessment  Patient does not need any further Speech Lanaguage Pathology Services    Recommendations  Patient destination: Home Follow up Recommendations: None Equipment Recommended: None recommended by SLP    SLP Frequency  (N/A)   SLP Duration  SLP Intensity  SLP Treatment/Interventions N/A   (N/A)   (N/A)    Pain Pain Assessment Pain Scale: 0-10 Pain Score: 3  Pain Type: Acute pain;Surgical pain Pain Location: Groin Pain Orientation: Right Pain Descriptors / Indicators: Sharp;Radiating Pain Frequency: Intermittent Pain Onset: On-going Patients Stated Pain Goal: 0 Pain Intervention(s): Medication (See eMAR)  Prior Functioning Cognitive/Linguistic Baseline: Baseline deficits Baseline deficit details: Memory and math calculation deficits reported at baseline Type of Home: House  Lives With: Daughter Available Help at Discharge: Available PRN/intermittently Education: 7th grade Vocation: On disability  SLP Evaluation Cognition Overall Cognitive Status: History of cognitive impairments - at baseline Arousal/Alertness: Awake/alert Orientation Level: Oriented  X4 Year: 2023 Month: June Day of Week: Correct Attention: Focused;Sustained Focused Attention: Appears intact Sustained Attention: Appears intact Memory: Impaired Memory Impairment: Decreased short term memory Decreased Short Term Memory: Verbal basic Awareness: Appears intact Problem Solving: Appears intact Problem Solving Impairment: Functional basic Executive Function: Reasoning Reasoning: Appears intact Safety/Judgment: Appears intact  Comprehension Auditory Comprehension Overall Auditory Comprehension: Appears within functional limits for tasks assessed Expression Expression Primary Mode of Expression: Verbal Verbal Expression Overall Verbal Expression: Appears within functional limits for tasks assessed Written Expression Dominant Hand: Right Oral Motor Oral Motor/Sensory Function Overall Oral Motor/Sensory Function: Within functional limits Motor Speech Overall Motor Speech: Appears within functional limits for tasks assessed Motor Planning: Witnin functional limits Motor Speech Errors: Not applicable  Care Tool Care Tool Cognition Ability to hear (with hearing aid or hearing appliances if normally used Ability to hear (with hearing aid or hearing appliances if normally used): 0. Adequate - no difficulty in normal conservation, social interaction, listening to TV   Expression of Ideas and Wants Expression of Ideas and Wants: 4. Without difficulty (complex and basic) - expresses complex messages without difficulty and with speech that is clear and easy to understand   Understanding Verbal and Non-Verbal Content Understanding Verbal and Non-Verbal Content: 4. Understands (complex and basic) - clear comprehension without cues or repetitions  Memory/Recall Ability Memory/Recall Ability : Current season;Location of own room;Staff names and faces;That he or she is in a hospital/hospital unit    Bedside Swallowing Assessment Did not complete - pt tolerating regular diet  and thin liquids per chart review.  Short Term Goals: N/A  Refer to Care Plan for Long Term Goals  Recommendations for other services: Neuropsych for pt reported panic  attacks  Discharge Criteria: Patient will be discharged from SLP if patient refuses treatment 3 consecutive times without medical reason, if treatment goals not met, if there is a change in medical status, if patient makes no progress towards goals or if patient is discharged from hospital.  The above assessment, treatment plan, treatment alternatives and goals were discussed and mutually agreed upon: by patient  Romelle Starcher A Jamarien Rodkey 05/22/2022, 12:27 PM

## 2022-05-22 NOTE — Progress Notes (Signed)
Inpatient Rehabilitation Care Coordinator Assessment and Plan Patient Details  Name: Diamond Adams MRN: [id number] Date of Birth: 30-Apr-1961  Today's Date: 05/22/2022  Hospital Problems: Principal Problem:   Acute ischemic right MCA stroke [facility]) Active Problems:   Acute right MCA stroke [facility])  Past Medical History:  Past Medical History:  Diagnosis Date   Anxiety disorder    Depression    Fibromyalgia    Herpes    Hypertension    PONV (postoperative nausea and vomiting)    Past Surgical History:  Past Surgical History:  Procedure Laterality Date   ABDOMINAL HYSTERECTOMY  1994   AORTIC ARCH ANGIOGRAPHY N/A 05/20/2022   Procedure: AORTIC ARCH ANGIOGRAPHY;  Surgeon: Broadus John, MD;  Location: [place] CV LAB;  Service: Cardiovascular;  Laterality: N/A;   CAROTID ANGIOGRAPHY N/A 05/20/2022   Procedure: CAROTID ANGIOGRAPHY;  Surgeon: Broadus John, MD;  Location: [place] CV LAB;  Service: Cardiovascular;  Laterality: N/A;   CARPAL TUNNEL RELEASE  2013,2011   right and left   EXCISION NASAL MASS Left 09/25/2013   Procedure: EXCISION NASAL CANCER WITH SPLIT THICKNESS SKIN GRAFT FROM RIGHT THIGH;  Surgeon: Ascencion Dike, MD;  Location: [place];  Service: ENT;  Laterality: Left;   RIGHT OOPHORECTOMY     THUMB ARTHROSCOPY     right   TONSILLECTOMY     TUBAL LIGATION     Social History:  reports that she has been smoking cigarettes. She has been smoking an average of .5 packs per day. She has never used smokeless tobacco. She reports that she does not drink alcohol and does not use drugs.  Family / Support Systems Marital Status: Divorced Patient Roles: Parent, Caregiver Children: Amanda-daughter [phone] Other Supports: Friends Anticipated Caregiver: Estill Bamberg Ability/Limitations of Caregiver: Daughter works out of the home and has children-pt was watching them PTA Caregiver Availability: Evenings only Family Dynamics: Close with daughter and her  grandchildren all will assist one another. Pt is somewhat anxious regarding her upcoming surgery and the co-pays for her insurance.  Social History Preferred language: English Religion: Non-Denominational Cultural Background: No issues Education: HS Health Literacy - How often do you need to have someone help you when you read instructions, pamphlets, or other written material from your doctor or pharmacy?: Sometimes Writes: Yes Employment Status: Disabled Public relations account executive Issues: No issues Guardian/Conservator: NOne-according to MD pt is capable of making her own decisions while here   Abuse/Neglect Abuse/Neglect Assessment Can Be Completed: Yes Physical Abuse: Denies Verbal Abuse: Denies Sexual Abuse: Denies Exploitation of patient/patient's resources: Denies Self-Neglect: Denies  Patient response to: Social Isolation - How often do you feel lonely or isolated from those around you?: Never  Emotional Status Pt's affect, behavior and adjustment status: Pt is motivated to do well and recover from this stroke. She is hopeful she will get stronger before her surgery on 7/6 and may need to come back here. She has always been able to take care of herself and others. Her main issue is her pain she is having in her groin Recent Psychosocial Issues: other health issues has been decling for a few weeks-month Psychiatric History: History of depression-anxiety takes medications for this but feels quite anxious since hospitalization and pending surgery. Will ask neuro-psych to see on Monday Substance Abuse History: Tobacco aware MD recommends to quit she will try to do this and is aware of the resources available to her  Patient / Family Perceptions, Expectations & Goals Pt/Family understanding  of illness & functional limitations: Pt and daughter can explain her stroke and deficits she does talk with the MD daily and feels her questions and concerns are being addressed. She will be  glad when her surgery is over. Premorbid pt/family roles/activities: Mom, grandmother, retiree, friend, nieghbor, etc Anticipated changes in roles/activities/participation: resume Pt/family expectations/goals: Pt states: " I hope to be able to take care of myself when I leave here."  " I am not one who relies on others."  [employer]: None Premorbid Home Care/DME Agencies: None Transportation available at discharge: self and daughter Is the patient able to respond to transportation needs?: Yes In the past 12 months, has lack of transportation kept you from medical appointments or from getting medications?: Yes In the past 12 months, has lack of transportation kept you from meetings, work, or from getting things needed for daily living?: Yes Resource referrals recommended: Neuropsychology  Discharge Planning Living Arrangements: Children, Other relatives Support Systems: Children, Friends/neighbors Type of Residence: Private residence Insurance Resources: Multimedia programmer (specify) Primary school teacher) Financial Resources: SSD, Family Support Financial Screen Referred: No Living Expenses: Education officer, community Management: Patient, Family Does the patient have any problems obtaining your medications?: No Home Management: Both she and daughter Patient/Family Preliminary Plans: Return home with daughter and grandchildren, daughter works out of the home. Pt will need to be mod/i to be safe home alone while daughter is working. pt to have surgery on 7/6 so will be discharged from here to surgery. Care Coordinator Barriers to Discharge: Decreased caregiver support, Insurance for SNF coverage Care Coordinator Anticipated Follow Up Needs: HH/OP  Clinical Impression Pleasant anxious female who is motivated but having pain issues with her groin. Scheduled for surgery on 7/6, so will transfer to surgery. Neuro-psych to see on Monday to assist with panic attacks and coping  Zabian Swayne,  Gardiner Rhyme 05/22/2022, 10:39 AM

## 2022-05-22 NOTE — Evaluation (Signed)
Occupational Therapy Assessment and Plan  Patient Details  Name: Diamond Adams MRN: [id number] Date of Birth: [birth year]  OT Diagnosis: acute pain, hemiplegia affecting non-dominant side, and muscle weakness (generalized) Rehab Potential: Rehab Potential (ACUTE ONLY): Excellent ELOS: 5-7 days   Today's Date: 05/22/2022 OT Individual Time: 8937-3428 OT Individual Time Calculation (min): 75 min     Hospital Problem: Principal Problem:   Acute ischemic right MCA stroke ([place]) Active Problems:   Acute right MCA stroke [facility])   Past Medical History:  Past Medical History:  Diagnosis Date   Anxiety disorder    Depression    Fibromyalgia    Herpes    Hypertension    PONV (postoperative nausea and vomiting)    Past Surgical History:  Past Surgical History:  Procedure Laterality Date   ABDOMINAL HYSTERECTOMY  1994   AORTIC ARCH ANGIOGRAPHY N/A 05/20/2022   Procedure: AORTIC ARCH ANGIOGRAPHY;  Surgeon: Broadus John, MD;  Location: [place] [facility];  Service: Cardiovascular;  Laterality: N/A;   CAROTID ANGIOGRAPHY N/A 05/20/2022   Procedure: CAROTID ANGIOGRAPHY;  Surgeon: Broadus John, MD;  Location: [place] [facility];  Service: Cardiovascular;  Laterality: N/A;   CARPAL TUNNEL RELEASE  2013,2011   right and left   EXCISION NASAL MASS Left 09/25/2013   Procedure: EXCISION NASAL CANCER WITH SPLIT THICKNESS SKIN GRAFT FROM RIGHT THIGH;  Surgeon: Ascencion Dike, MD;  Location: [place];  Service: ENT;  Laterality: Left;   RIGHT OOPHORECTOMY     THUMB ARTHROSCOPY     right   TONSILLECTOMY     TUBAL LIGATION      Assessment & Plan Clinical Impression: . Diamond Adams is a 61 year old female who presented to the [facility] emergency department on 05/17/2022 after acute onset of dizziness a fall to the ground and complaining of numbness in all 4 extremities.  She also reported intermittent episodes of extremity weakness and paresthesias over the last 2 weeks.  Telemetry  neurology consulted and the patient given aspirin. Work-up to rule in or out acute stroke.  She underwent CTA which showed carotid dissection and vascular surgery consulted.  Neurology advised to initiate Plavix.  She was transferred to [facility].  She was outside the window for tenecteplase.  She underwent EEG which suggested cortical dysfunction arising from right temporal region.  No seizures or elliptic form discharges.  MRI of the brain revealed right MCA scattered infarcts largest at the right frontoparietal region.  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  On 6/28, she underwent carotid angiogram by Dr. Virl Cagey.  Greater then 90% stenosis appreciated at the ostium of the right ICA.  She will be maintained on aspirin 325 mg daily and Plavix 75 mg daily with plans to proceed with right TCAR on 7/6 with Dr. Virl Cagey.  Right groin stable status post angiogram.  Reporting anxiety at nighttime and given alprazolam 0.25 mg nightly.  She is tolerating a regular diet and will be maintained on Plavix, aspirin and statin. The patient requires inpatient physical medicine and rehabilitation evaluations and treatment secondary to dysfunction due to right MCA territory scattered infarcts.  Patient transferred to CIR on 05/21/2022 .    Patient currently requires min with basic self-care skills secondary to muscle weakness, decreased cardiorespiratoy endurance, decreased coordination, and decreased standing balance and hemiplegia.  Prior to hospitalization, patient could complete ADLs with independent .  Patient will benefit from skilled  intervention to increase independence with basic self-care skills prior to discharge home with care partner.  Anticipate patient will require intermittent supervision and no further OT follow recommended.  OT - End of Session Endurance Deficit: Yes OT Assessment Rehab Potential (ACUTE ONLY): Excellent OT Patient demonstrates  impairments in the following area(s): Balance;Endurance;Motor;Pain OT Basic ADL's Functional Problem(s): Bathing;Dressing;Toileting OT Advanced ADL's Functional Problem(s): Light Housekeeping OT Transfers Functional Problem(s): Toilet;Tub/Shower OT Additional Impairment(s): None OT Plan OT Intensity: Minimum of 1-2 x/day, 45 to 90 minutes OT Frequency: 5 out of 7 days OT Duration/Estimated Length of Stay: 5-7 days OT Treatment/Interventions: Balance/vestibular training;Discharge planning;DME/adaptive equipment instruction;Pain management;Patient/family education;Psychosocial support;Neuromuscular re-education;Self Care/advanced ADL retraining;Therapeutic Activities;Therapeutic Exercise;UE/LE Strength taining/ROM;UE/LE Coordination activities OT Self Feeding Anticipated Outcome(s): no goal, pt is independent OT Basic Self-Care Anticipated Outcome(s): Mod I OT Toileting Anticipated Outcome(s): Mod I OT Bathroom Transfers Anticipated Outcome(s): Mod I OT Recommendation Patient destination: Home Follow Up Recommendations: None Equipment Recommended: Tub/shower seat   OT Evaluation Precautions/Restrictions  Precautions Precautions: Fall Restrictions Weight Bearing Restrictions: No  Pain Pain Assessment Pain Scale: 0-10 Pain Score: 3  Pain Type: Acute pain;Surgical pain Pain Location: Groin Pain Orientation: Right Pain Descriptors / Indicators: Sharp Pain Frequency: Intermittent Pain Onset: With Activity Patients Stated Pain Goal: 0 Pain Intervention(s): Medication (See eMAR) Home Living/Prior [place] expects to be discharged to:: Private residence Living Arrangements: Children, Other relatives Available Help at Discharge: Available PRN/intermittently Type of Home: House Home Access: Stairs to enter Entrance Stairs-Number of Steps: 3 Entrance Stairs-Rails: None Home Layout: Two level, Able to live on main level with bedroom/bathroom Bathroom  Shower/Tub: Tub/shower unit, Architectural technologist: Standard  Lives With: Daughter (4 grandchildren) Prior Function Level of Independence: Independent with gait, Independent with basic ADLs, Independent with transfers, Independent with homemaking with ambulation  Able to Take Stairs?: Yes Driving: No Vocation: On disability Vision Baseline Vision/History: 1 Wears glasses Ability to See in Adequate Light: 1 Impaired Patient Visual Report: No change from baseline Vision Assessment?: No apparent visual deficits Eye Alignment: Within Functional Limits Ocular Range of Motion: Within Functional Limits Tracking/Visual Pursuits: Able to track stimulus in all quads without difficulty Saccades: Within functional limits Convergence: Within functional limits Visual Fields: No apparent deficits Depth Perception: Overshoots (during finger nose test) Perception  Perception: Within Functional Limits Praxis Praxis: Intact Cognition Cognition Overall Cognitive Status: Within Functional Limits for tasks assessed Arousal/Alertness: Awake/alert Orientation Level: Person;Place;Situation Person: Oriented Place: Oriented Situation: Oriented Memory: Appears intact Memory Impairment: Decreased short term memory Focused Attention: Appears intact Sustained Attention: Appears intact Problem Solving: Appears intact Safety/Judgment: Appears intact Brief Interview for Mental Status (BIMS) Repetition of Three Words (First Attempt): 3 Temporal Orientation: Year: Correct Temporal Orientation: Month: Accurate within 5 days Temporal Orientation: Day: Correct Recall: "Sock": Yes, no cue required Recall: "Blue": Yes, no cue required Recall: "Bed": Yes, no cue required BIMS Summary Score: 15 Sensation Sensation Light Touch: Appears Intact Hot/Cold: Appears Intact Proprioception: Appears Intact Stereognosis: Appears Intact Coordination Gross Motor Movements are Fluid and Coordinated: No Fine Motor  Movements are Fluid and Coordinated: Yes Coordination and Movement Description: Limited by mild LLE incordination and R groin pain from procedure Finger Nose Finger Test: WFL - slight over shooting on L due to vision impairments Heel Shin Test: [facility] Motor  Motor Motor: Hemiplegia Motor - Skilled Clinical Observations: decreased LLE strength, limited mobility in RLE due to pain  Trunk/Postural Assessment  Cervical Assessment Cervical Assessment: Within Functional Limits Thoracic Assessment Thoracic Assessment: Within  Functional Limits Lumbar Assessment Lumbar Assessment: Within Functional Limits Postural Control Postural Control: Within Functional Limits  Balance Balance Balance Assessed: Yes Static Standing Balance Static Standing - Level of Assistance: 5: Stand by assistance Dynamic Standing Balance Dynamic Standing - Level of Assistance: 4: Min assist Extremity/Trunk Assessment RUE Assessment RUE Assessment: Within Functional Limits LUE Assessment LUE Assessment: Within Functional Limits  Care Tool Care Tool Self Care Eating   Eating Assist Level: Independent    Oral Care    Oral Care Assist Level: Supervision/Verbal cueing    Bathing   Body parts bathed by patient: Right arm;Left arm;Chest;Abdomen;Front perineal area;Buttocks;Right upper leg;Left upper leg;Left lower leg;Face Body parts bathed by helper: Right lower leg   Assist Level: Minimal Assistance - Patient > 75%    Upper Body Dressing(including orthotics)   What is the patient wearing?: Bra;Pull over shirt   Assist Level: Set up assist    Lower Body Dressing (excluding footwear)   What is the patient wearing?: Underwear/pull up;Pants Assist for lower body dressing: Minimal Assistance - Patient > 75%    Putting on/Taking off footwear   What is the patient wearing?: Socks;Shoes Assist for footwear: Minimal Assistance - Patient > 75%       Care Tool Toileting Toileting activity   Assist for  toileting: Supervision/Verbal cueing     Care Tool Bed Mobility Roll left and right activity   Roll left and right assist level: Contact Guard/Touching assist    Sit to lying activity   Sit to lying assist level: Contact Guard/Touching assist    Lying to sitting on side of bed activity   Lying to sitting on side of bed assist level: the ability to move from lying on the back to sitting on the side of the bed with no back support.: Contact Guard/Touching assist     Care Tool Transfers Sit to stand transfer   Sit to stand assist level: Contact Guard/Touching assist    Chair/bed transfer   Chair/bed transfer assist level: Contact Guard/Touching assist     Toilet transfer   Assist Level: Supervision/Verbal cueing     Care Tool Cognition  Expression of Ideas and Wants Expression of Ideas and Wants: 4. Without difficulty (complex and basic) - expresses complex messages without difficulty and with speech that is clear and easy to understand  Understanding Verbal and Non-Verbal Content Understanding Verbal and Non-Verbal Content: 4. Understands (complex and basic) - clear comprehension without cues or repetitions   Memory/Recall Ability Memory/Recall Ability : Current season;Location of own room;Staff names and faces;That he or she is in a hospital/hospital unit   Refer to Care Plan for Alex 1 OT Short Term Goal 1 (Week 1): STGs = LTGs  Recommendations for other services: None    Skilled Therapeutic Intervention ADL ADL Eating: Independent Grooming: Supervision/safety Where Assessed-Grooming: Standing at sink Upper Body Bathing: Supervision/safety Where Assessed-Upper Body Bathing: Shower Lower Body Bathing: Minimal assistance Where Assessed-Lower Body Bathing: Shower Upper Body Dressing: Setup Lower Body Dressing: Minimal assistance Toileting: Supervision/safety Where Assessed-Toileting: Glass blower/designer: Close supervision Toilet  Transfer Method: Counselling psychologist: Energy manager: Close supervision Social research officer, government Method: Heritage manager: Cabin crew Sit to Stand: Contact Guard/Touching assist Stand to Sit: Contact Guard/Touching assist  Pt seen for initial evaluation and ADL training with a focus on safe mobility with her R leg pain, balance and adaptive strategies. Pt did  discuss that she has been have the stroke symptoms of feeling poorly, extreme fatigue and visual problems for over a month.  She has plans to see a neuro opthamologist. Her family has been doing the IADLs for her over the last month.  Discussed role of OT, pt's goals, POC, ELOS.  Pt resting in recliner with all needs met.   Discharge Criteria: Patient will be discharged from OT if patient refuses treatment 3 consecutive times without medical reason, if treatment goals not met, if there is a change in medical status, if patient makes no progress towards goals or if patient is discharged from hospital.  The above assessment, treatment plan, treatment alternatives and goals were discussed and mutually agreed upon: by patient  [facility] 05/22/2022, 12:18 PM

## 2022-05-22 NOTE — Progress Notes (Signed)
PROGRESS NOTE   Subjective/Complaints: No new complaints this morning Glucose 122, albumin 3.4, WBC 12.1 Let her know other labs are stable Grounds pass ordered  ROS: +right groin pain  Objective:   No results found. Recent Labs    05/21/22 0242 05/22/22 0540  WBC 14.1* 12.1*  HGB 13.6 13.6  HCT 39.7 39.8  PLT 282 249   Recent Labs    05/21/22 0242 05/22/22 0540  NA 139 140  K 3.8 4.0  CL 111 105  CO2 20* 27  GLUCOSE 98 122*  BUN 17 13  CREATININE 0.85 0.65  CALCIUM 8.8* 9.0    Intake/Output Summary (Last 24 hours) at 05/22/2022 0929 Last data filed at 05/22/2022 0740 Gross per 24 hour  Intake 400 ml  Output --  Net 400 ml        Physical Exam: Vital Signs Blood pressure (!) 141/72, pulse 63, temperature (!) 97.5 F (36.4 C), temperature source Oral, resp. rate 14, height '4\' 10"'$  (1.473 m), weight 64.3 kg, SpO2 100 %. Gen: no distress, normal appearing Eyes:     General:        Right eye: No discharge.        Left eye: No discharge.     Extraocular Movements: Extraocular movements intact.     Comments: L eye a little smaller compared to R No nystagmus B/L   Cardiovascular:     Rate and Rhythm: Normal rate and regular rhythm.     Heart sounds: Normal heart sounds. No murmur heard.    No gallop.  Pulmonary:     Effort: Pulmonary effort is normal. No respiratory distress.     Breath sounds: Normal breath sounds. No wheezing, rhonchi or rales.  Abdominal:     General: There is no distension.     Palpations: Abdomen is soft.     Tenderness: There is no abdominal tenderness.     Comments: Slightly hypoactive  Musculoskeletal:     Cervical back: Neck supple. No tenderness.     Comments: Right groin puncture site with non-expanding hematoma. Soft to palpation. + ecchymosis medially In groin, upper thigh and lower RLQ- all purple bruising RUE 5/5 LUE 5-/5 throughout RLE_ 5-/5 proximally and 5/5  distally- due to pain LLE- 5-/5 throughout  Skin:    General: Skin is warm and dry.     Comments: Right AC PIV    Right groin incision with significant bruising Neurological:     Mental Status: She is alert.     Comments: Intact to light touch in face and in all 4 limbs  Psychiatric:        Mood and Affect: Mood normal.        Behavior: Behavior normal.      Assessment/Plan: 1. Functional deficits which require 3+ hours per day of interdisciplinary therapy in a comprehensive inpatient rehab setting. Physiatrist is providing close team supervision and 24 hour management of active medical problems listed below. Physiatrist and rehab team continue to assess barriers to discharge/monitor patient progress toward functional and medical goals  Care Tool:  Bathing              Bathing assist  Assist Level: Minimal Assistance - Patient > 75%     Upper Body Dressing/Undressing Upper body dressing   What is the patient wearing?: Hospital gown only    Upper body assist      Lower Body Dressing/Undressing Lower body dressing            Lower body assist       Toileting Toileting    Toileting assist       Transfers Chair/bed transfer  Transfers assist           Locomotion Ambulation   Ambulation assist              Walk 10 feet activity   Assist           Walk 50 feet activity   Assist           Walk 150 feet activity   Assist           Walk 10 feet on uneven surface  activity   Assist           Wheelchair     Assist               Wheelchair 50 feet with 2 turns activity    Assist            Wheelchair 150 feet activity     Assist          Blood pressure (!) 141/72, pulse 63, temperature (!) 97.5 F (36.4 C), temperature source Oral, resp. rate 14, height '4\' 10"'$  (1.473 m), weight 64.3 kg, SpO2 100 %.    Medical Problem List and Plan: 1. Functional deficits secondary to right  scattered MCA territory infarcts             -patient may  shower             -ELOS/Goals: 7-10 days- mod I  Grounds pass ordered 2.  Antithrombotics: -DVT/anticoagulation:  Pharmaceutical: Heparin             -antiplatelet therapy: Plavix, aspirin 3. Right groin pain: Added Tylenol #3 for moderate pain. Tylenol, oxycodone as needed 4. Mood/Sleep: LCSW to evaluate and provide emotional support             -antipsychotic agents: n/a 5. Neuropsych/cognition: This patient is capable of making decisions on her own behalf. 6. Skin/Wound Care: Routine skin care checks 7. Fluids/Electrolytes/Nutrition: I's and O's and follow-up chemistries 8: Right carotid artery stenosis: Planning right TCAR 7/6 9: History of depression: started on Prozac  10: Anxiety disorder: Continue alprazolam nightly 11: Hyperlipidemia: Continue Crestor (at home on Pravachol 40 mg daily) 12: Essential hypertension: holding home Zestoretic 20/25 q AS and qHS as well as Toprol XL 12.5 mg daily . Add magnesium gluconate '250mg'$  HS 13: Tobacco use disorder: Advised regarding cessation.  She does report cravings at this time. 14: GERD: Continue Protonix 15. Screening for vitamin D deficiency: added on to today's labs, f/u tomorrow  LOS: 1 days A FACE TO FACE EVALUATION WAS PERFORMED  Diamond Adams 05/22/2022, 9:29 AM

## 2022-05-22 NOTE — Discharge Summary (Signed)
Physician Discharge Summary  Patient ID: Diamond Adams MRN: [id number] DOB/AGE: 59-Nov-1962 59 y.o.  Admit date: 05/21/2022 Discharge date: 05/26/2022  Discharge Diagnoses:  Principal Problem:   Acute ischemic right MCA stroke Diamond Adams) Active Problems:   Acute right MCA stroke (Diamond Adams) Debility is secondary to acute right MCA stroke Right carotid artery stenosis Depression Anxiety Hyperlipidemia Essential hypertension Tobacco use disorder Gastroesophageal reflux disease  Discharged Condition: stable  Significant Diagnostic Studies: Labs:  Basic Metabolic Panel: Recent Labs  Lab 05/18/22 0011 05/18/22 0410 05/20/22 0319 05/21/22 0242 05/22/22 0540  NA 134* 136 140 139 140  K 3.1* 3.4* 3.5 3.8 4.0  CL 101 104 107 111 105  CO2 24 24 20* 20* 27  GLUCOSE 107* 96 128* 98 122*  BUN '12 10 10 17 13  '$ CREATININE 0.57 0.56 0.57 0.85 0.65  CALCIUM 9.0 8.6* 9.2 8.8* 9.0  MG 2.0  --   --  2.0  --   PHOS  --   --   --  4.1  --     CBC: Recent Labs  Lab 05/18/22 0011 05/18/22 0410 05/20/22 0319 05/21/22 0242 05/22/22 0540  WBC 17.9*   < > 12.8* 14.1* 12.1*  NEUTROABS 13.1*  --   --  9.7* 7.4  HGB 15.6*   < > 14.4 13.6 13.6  HCT 44.5   < > 42.3 39.7 39.8  MCV 84.3   < > 85.1 86.9 86.1  PLT 264   < > 274 282 249   < > = values in this interval not displayed.    CBG: No results for input(s): "GLUCAP" in the last 168 hours.  Brief HPI:   Diamond Adams is a 61 y.o. female who presented to the [facility] emergency department with acute onset of dizziness, weakness resulting in a fall to the ground without injury and numbness in all 4 extremities.  She underwent CTA which showed carotid dissection and vascular surgery was consulted.  Neurology consulted and advised initiate Plavix.  She was transferred to [facility].  MRI of the brain revealed right MCA scattered infarcts.  Carotid duplex showed significant right ICA stenosis of 80 to 99%.  Underwent carotid angiogram.  She is maintained on  aspirin and Plavix with plans to proceed with right TCAR on 7/6.   Adams Course: Diamond Adams was admitted to rehab 05/21/2022 for inpatient therapies to consist of PT, ST and OT at least three hours five days a week. Past admission physiatrist, therapy team and rehab RN have worked together to provide customized collaborative inpatient rehab.   Blood pressures were monitored on TID basis and remained controlled off her home Zestoretic and Toprol-XL.  Magnesium gluconate 250 mg nightly added.  Rehab course: During patient's stay in rehab weekly team conferences were held to monitor patient's progress, set goals and discuss barriers to discharge. At admission, patient required supervision/verbal cueing and minimal assistance for ADLs and contact-guard assist for mobility.  She has had improvement in activity tolerance, balance, postural control as well as ability to compensate for deficits. She has had improvement in functional use RUE/LUE  and RLE/LLE as well as improvement in awareness       Disposition: Home There are no questions and answers to display.         Diet:  Special Instructions:  NPO after midnight 7/5. Call Dr. Virl Cagey office for surgery instructions.  No driving, alcohol consumption or tobacco use.    Allergies as of 05/22/2022  Reactions   Trazodone And Nefazodone Hives     Med Rec must be completed prior to using this [facility]***        Signed: [name] 05/22/2022, 4:37 PM

## 2022-05-22 NOTE — Progress Notes (Signed)
Inpatient Rehabilitation  Patient information reviewed and entered into eRehab system by Fanny Agan M. Abdulaziz Toman, M.A., CCC/SLP, PPS Coordinator.  Information including medical coding, functional ability and quality indicators will be reviewed and updated through discharge.    

## 2022-05-22 NOTE — Plan of Care (Signed)
  Problem: Consults Goal: RH STROKE PATIENT EDUCATION Description: See Patient Education module for education specifics  Outcome: Progressing   Problem: RH SKIN INTEGRITY Goal: RH STG MAINTAIN SKIN INTEGRITY WITH ASSISTANCE Description: STG Maintain Skin Integrity With Mod I Assistance. Outcome: Progressing   Problem: RH SAFETY Goal: RH STG ADHERE TO SAFETY PRECAUTIONS W/ASSISTANCE/DEVICE Description: STG Adhere to Safety Precautions With Cues and Reminders. Outcome: Progressing Goal: RH STG DECREASED RISK OF FALL WITH ASSISTANCE Description: STG Decreased Risk of Fall With Mod I Assistance. Outcome: Progressing   Problem: RH COGNITION-NURSING Goal: RH STG USES MEMORY AIDS/STRATEGIES W/ASSIST TO PROBLEM SOLVE Description: STG Uses Memory Aids/Strategies With Mod I Assistance to Problem Solve. Outcome: Progressing Goal: RH STG ANTICIPATES NEEDS/CALLS FOR ASSIST W/ASSIST/CUES Description: STG Anticipates Needs/Calls for Assist With Cues and Reminders. Outcome: Progressing   Problem: RH PAIN MANAGEMENT Goal: RH STG PAIN MANAGED AT OR BELOW PT'S PAIN GOAL Description: Patient to report pain at < 3 on a 0-10 pain scale. Outcome: Progressing   Problem: RH KNOWLEDGE DEFICIT Goal: RH STG INCREASE KNOWLEDGE OF STROKE PROPHYLAXIS Description: Patient will demonstrate knowledge of medications used to prevent future strokes with educational materials and handouts provided by staff independently at discharge. Outcome: Progressing

## 2022-05-22 NOTE — Plan of Care (Signed)
  Problem: Sit to Stand Goal: LTG:  Patient will perform sit to stand in prep for activites of daily living with assistance level (OT) Description: LTG:  Patient will perform sit to stand in prep for activites of daily living with assistance level (OT) Flowsheets (Taken 05/22/2022 1227) LTG: PT will perform sit to stand in prep for activites of daily living with assistance level: Independent   Problem: RH Grooming Goal: LTG Patient will perform grooming w/assist,cues/equip (OT) Description: LTG: Patient will perform grooming with assist, with/without cues using equipment (OT) Flowsheets (Taken 05/22/2022 1227) LTG: Pt will perform grooming with assistance level of: Independent   Problem: RH Bathing Goal: LTG Patient will bathe all body parts with assist levels (OT) Description: LTG: Patient will bathe all body parts with assist levels (OT) Flowsheets (Taken 05/22/2022 1227) LTG: Pt will perform bathing with assistance level/cueing: Independent with assistive device  LTG: Position pt will perform bathing: Shower   Problem: RH Dressing Goal: LTG Patient will perform upper body dressing (OT) Description: LTG Patient will perform upper body dressing with assist, with/without cues (OT). Flowsheets (Taken 05/22/2022 1227) LTG: Pt will perform upper body dressing with assistance level of: Independent Goal: LTG Patient will perform lower body dressing w/assist (OT) Description: LTG: Patient will perform lower body dressing with assist, with/without cues in positioning using equipment (OT) Flowsheets (Taken 05/22/2022 1227) LTG: Pt will perform lower body dressing with assistance level of: Independent with assistive device   Problem: RH Toileting Goal: LTG Patient will perform toileting task (3/3 steps) with assistance level (OT) Description: LTG: Patient will perform toileting task (3/3 steps) with assistance level (OT)  Flowsheets (Taken 05/22/2022 1227) LTG: Pt will perform toileting task (3/3  steps) with assistance level: Independent with assistive device   Problem: RH Light Housekeeping Goal: LTG Patient will perform light housekeeping w/assist (OT) Description: LTG: Patient will perform light housekeeping with assistance, with/without cues (OT). Flowsheets (Taken 05/22/2022 1227) LTG: Pt will perform light housekeeping with assistance level of: Supervision/Verbal cueing   Problem: RH Toilet Transfers Goal: LTG Patient will perform toilet transfers w/assist (OT) Description: LTG: Patient will perform toilet transfers with assist, with/without cues using equipment (OT) Flowsheets (Taken 05/22/2022 1227) LTG: Pt will perform toilet transfers with assistance level of: Independent with assistive device   Problem: RH Tub/Shower Transfers Goal: LTG Patient will perform tub/shower transfers w/assist (OT) Description: LTG: Patient will perform tub/shower transfers with assist, with/without cues using equipment (OT) Flowsheets (Taken 05/22/2022 1227) LTG: Pt will perform tub/shower stall transfers with assistance level of: Independent with assistive device

## 2022-05-22 NOTE — Evaluation (Signed)
Physical Therapy Assessment and Plan  Patient Details  Name: Diamond Adams MRN: [id number] Date of Birth: 09-Feb-1961  PT Diagnosis: Abnormality of gait, Difficulty walking, Hemiparesis non-dominant, Muscle weakness, and Pain in R groin Rehab Potential: Good ELOS: 5-7 days   Today's Date: 05/22/2022 PT Individual Time: 1100-1200 PT Individual Time Calculation (min): 60 min    Hospital Problem: Principal Problem:   Acute ischemic right MCA stroke ([place]) Active Problems:   Acute right MCA stroke [facility])   Past Medical History:  Past Medical History:  Diagnosis Date   Anxiety disorder    Depression    Fibromyalgia    Herpes    Hypertension    PONV (postoperative nausea and vomiting)    Past Surgical History:  Past Surgical History:  Procedure Laterality Date   ABDOMINAL HYSTERECTOMY  1994   AORTIC ARCH ANGIOGRAPHY N/A 05/20/2022   Procedure: AORTIC ARCH ANGIOGRAPHY;  Surgeon: Broadus John, MD;  Location: [place] [facility];  Service: Cardiovascular;  Laterality: N/A;   CAROTID ANGIOGRAPHY N/A 05/20/2022   Procedure: CAROTID ANGIOGRAPHY;  Surgeon: Broadus John, MD;  Location: [place] [facility];  Service: Cardiovascular;  Laterality: N/A;   CARPAL TUNNEL RELEASE  2013,2011   right and left   EXCISION NASAL MASS Left 09/25/2013   Procedure: EXCISION NASAL CANCER WITH SPLIT THICKNESS SKIN GRAFT FROM RIGHT THIGH;  Surgeon: Ascencion Dike, MD;  Location: [place];  Service: ENT;  Laterality: Left;   RIGHT OOPHORECTOMY     THUMB ARTHROSCOPY     right   TONSILLECTOMY     TUBAL LIGATION      Assessment & Plan Clinical Impression: Patient is a 61 year old female who presented to the [facility] emergency department on 05/17/2022 after acute onset of dizziness a fall to the ground and complaining of numbness in all 4 extremities.  She also reported intermittent episodes of extremity weakness and paresthesias over the last 2 weeks.  Telemetry neurology consulted and the  patient given aspirin. Work-up to rule in or out acute stroke.  She underwent CTA which showed carotid dissection and vascular surgery consulted.  Neurology advised to initiate Plavix.  She was transferred to [facility].  She was outside the window for tenecteplase.  She underwent EEG which suggested cortical dysfunction arising from right temporal region.  No seizures or elliptic form discharges.  MRI of the brain revealed right MCA scattered infarcts largest at the right frontoparietal region.  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  On 6/28, she underwent carotid angiogram by Dr. Virl Cagey.  Greater then 90% stenosis appreciated at the ostium of the right ICA.  She will be maintained on aspirin 325 mg daily and Plavix 75 mg daily with plans to proceed with right TCAR on 7/6 with Dr. Virl Cagey.  Right groin stable status post angiogram.  Reporting anxiety at nighttime and given alprazolam 0.25 mg nightly.  She is tolerating a regular diet and will be maintained on Plavix, aspirin and statin. The patient requires inpatient physical medicine and rehabilitation evaluations and treatment secondary to dysfunction due to right MCA territory scattered infarcts. Patient transferred to CIR on 05/21/2022 .   Patient currently requires  CGA  with mobility secondary to muscle weakness, decreased cardiorespiratoy endurance, and hemiplegia and decreased balance strategies.  Prior to hospitalization, patient was independent  with mobility and lived with Daughter (4 grandchildren) in a House home.  Home access is  3Stairs to enter.  Patient will benefit from skilled PT intervention to maximize safe functional mobility, minimize fall risk, and decrease caregiver burden for planned discharge home with intermittent assist.  Anticipate patient will benefit from follow up OP at discharge.  PT - End of Session Activity Tolerance: Tolerates 10 - 20 min activity with multiple  rests Endurance Deficit: Yes PT Assessment Rehab Potential (ACUTE/IP ONLY): Good PT Barriers to Discharge: Lack of/limited family support;Insurance for SNF coverage;Weight PT Patient demonstrates impairments in the following area(s): Balance;Motor;Endurance;Pain PT Transfers Functional Problem(s): Bed Mobility;Bed to Chair;Car PT Locomotion Functional Problem(s): Ambulation;Stairs PT Plan PT Intensity: Minimum of 1-2 x/day ,45 to 90 minutes PT Frequency: 5 out of 7 days PT Duration Estimated Length of Stay: 5-7 days PT Treatment/Interventions: Ambulation/gait training;Discharge planning;Functional mobility training;Psychosocial support;Therapeutic Activities;Visual/perceptual remediation/compensation;Therapeutic Exercise;Wheelchair propulsion/positioning;Neuromuscular re-education;Disease management/prevention;Balance/vestibular training;DME/adaptive equipment instruction;Pain management;UE/LE Strength taining/ROM;UE/LE Hydrologist training;Patient/family education;Community reintegration PT Transfers Anticipated Outcome(s): mod I PT Locomotion Anticipated Outcome(s): mod I PT Recommendation Follow Up Recommendations: 24 hour supervision/assistance;Outpatient PT Patient destination: Home Equipment Recommended: To be determined   PT Evaluation Precautions/Restrictions Precautions Precautions: Fall Restrictions Weight Bearing Restrictions: No Pain Pain Assessment Pain Scale: 0-10 Pain Score: 3  Pain Type: Acute pain;Surgical pain Pain Location: Groin Pain Orientation: Right Pain Descriptors / Indicators: Sharp Pain Frequency: Intermittent Pain Onset: With Activity Patients Stated Pain Goal: 0 Pain Intervention(s): Medication (See eMAR) Pain Interference Pain Interference Pain Effect on Sleep: 3. Frequently Pain Interference with Therapy Activities: 3. Frequently Pain Interference with Day-to-Day Activities: 3. Frequently Home Living/Prior Functioning Home  Living Living Arrangements: Children;Other relatives Available Help at Discharge: Available PRN/intermittently Type of Home: House Home Access: Stairs to enter Entrance Stairs-Number of Steps: 3 Entrance Stairs-Rails: None Home Layout: Two level;Able to live on main level with bedroom/bathroom Bathroom Shower/Tub: Tub/shower unit;Curtain Bathroom Toilet: Standard  Lives With: Daughter (4 grandchildren) Prior Function Level of Independence: Independent with gait;Independent with basic ADLs;Independent with transfers;Independent with homemaking with ambulation  Able to Take Stairs?: Yes Driving: No Vocation: On disability Vision/Perception  Vision - History Ability to See in Adequate Light: 1 Impaired Vision - Assessment Eye Alignment: Within Functional Limits Ocular Range of Motion: Within Functional Limits Tracking/Visual Pursuits: Able to track stimulus in all quads without difficulty Saccades: Within functional limits Convergence: Within functional limits Perception Perception: Within Functional Limits Praxis Praxis: Intact  Cognition Overall Cognitive Status: Within Functional Limits for tasks assessed Arousal/Alertness: Awake/alert Orientation Level: Oriented X4 Focused Attention: Appears intact Sustained Attention: Appears intact Memory: Appears intact Memory Impairment: Decreased short term memory Problem Solving: Appears intact Safety/Judgment: Appears intact Sensation Sensation Light Touch: Appears Intact Hot/Cold: Appears Intact Proprioception: Appears Intact Stereognosis: Appears Intact Coordination Gross Motor Movements are Fluid and Coordinated: No Fine Motor Movements are Fluid and Coordinated: Yes Coordination and Movement Description: Limited by mild LLE incordination and R groin pain from procedure Finger Nose Finger Test: WFL - slight over shooting on L due to vision impairments Heel Shin Test: [facility] Motor  Motor Motor: Hemiplegia Motor - Skilled  Clinical Observations: decreased LLE strength, limited mobility in RLE due to pain   Trunk/Postural Assessment  Cervical Assessment Cervical Assessment: Within Functional Limits Thoracic Assessment Thoracic Assessment: Within Functional Limits Lumbar Assessment Lumbar Assessment: Within Functional Limits Postural Control Postural Control: Within Functional Limits  Balance Balance Balance Assessed: Yes Static Standing Balance Static Standing - Level of Assistance: 5: Stand by assistance Dynamic Standing Balance Dynamic Standing - Level of Assistance: 4: Min assist Extremity Assessment  RUE Assessment  RUE Assessment: Within Functional Limits LUE Assessment LUE Assessment: Within Functional Limits RLE Assessment RLE Assessment: Within Functional Limits General Strength Comments: Limited by R groin pain LLE Assessment LLE Assessment: Exceptions to [facility] General Strength Comments: Grossly 4/5  Care Tool Care Tool Bed Mobility Roll left and right activity   Roll left and right assist level: Contact Guard/Touching assist    Sit to lying activity   Sit to lying assist level: Contact Guard/Touching assist    Lying to sitting on side of bed activity   Lying to sitting on side of bed assist level: the ability to move from lying on the back to sitting on the side of the bed with no back support.: Contact Guard/Touching assist     Care Tool Transfers Sit to stand transfer   Sit to stand assist level: Contact Guard/Touching assist    Chair/bed transfer   Chair/bed transfer assist level: Contact Guard/Touching assist     Psychologist, counselling transfer activity did not occur: Safety/medical concerns        Care Tool Locomotion Ambulation   Assist level: Contact Guard/Touching assist Assistive device: Walker-rolling Max distance: 125f  Walk 10 feet activity   Assist level: Contact Guard/Touching assist Assistive device: Walker-rolling   Walk 50 feet  with 2 turns activity   Assist level: Contact Guard/Touching assist Assistive device: Walker-rolling  Walk 150 feet activity Walk 150 feet activity did not occur: Safety/medical concerns (fatigue)      Walk 10 feet on uneven surfaces activity Walk 10 feet on uneven surfaces activity did not occur: Safety/medical concerns      Stairs   Assist level: Contact Guard/Touching assist Stairs assistive device: 2 hand rails Max number of stairs: 4  Walk up/down 1 step activity   Walk up/down 1 step (curb) assist level: Contact Guard/Touching assist Walk up/down 1 step or curb assistive device: 2 hand rails  Walk up/down 4 steps activity   Walk up/down 4 steps assist level: Contact Guard/Touching assist Walk up/down 4 steps assistive device: 2 hand rails  Walk up/down 12 steps activity Walk up/down 12 steps activity did not occur: Safety/medical concerns      Pick up small objects from floor Pick up small object from the floor (from standing position) activity did not occur: Safety/medical concerns      Wheelchair Is the patient using a wheelchair?: No          Wheel 50 feet with 2 turns activity      Wheel 150 feet activity        Refer to Care Plan for Long Term Goals  SHORT TERM GOAL WEEK 1 PT Short Term Goal 1 (Week 1): STG = LTG due to ELOS  Recommendations for other services: None   Skilled Therapeutic Intervention Mobility Transfers Transfers: Sit to Stand;Stand to Sit;Stand Pivot Transfers Sit to Stand: Contact Guard/Touching assist Stand to Sit: Contact Guard/Touching assist Stand Pivot Transfers: Contact Guard/Touching assist Transfer (Assistive device): Rolling walker Locomotion  Gait Ambulation: Yes Gait Assistance: Contact Guard/Touching assist Gait Distance (Feet): 125 Feet Assistive device: Rolling walker Gait Gait: Yes Gait Pattern: Impaired Gait Pattern: Antalgic;Decreased hip/knee flexion - left;Decreased hip/knee flexion - right;Decreased weight  shift to right;Wide base of support Stairs / Additional Locomotion Stairs: Yes Stairs Assistance: Contact Guard/Touching assist Stair Management Technique: Two rails Number of Stairs: 4 Height of Stairs: 6 Wheelchair Mobility Wheelchair Mobility: No   Retrieved 18x16 w/c from DME closet.  Required additional support to leg rests to accommodate for short stature (pt is 4'10). Pt sitting in recliner to start. Pleasant and agreeable to PT evaluation. Oriented x4 with mild L sided weakness. Has R groin pain and bruising from carotid angiogram. This pain limits her functional mobility and is somewhat antalgic. Overall, she completes sit<>stand transfers with CGA and RW, ambulates 160f with CGA and RW, and can navigate up/down 4, 6inch steps using 2 rails with CGA. She was continent of bladder on the toilet during session, able to complete 3/3 toileting tasks without assist. Remained seated in recliner at end of session with all needs met, call bell in reach.   Discharge Criteria: Patient will be discharged from PT if patient refuses treatment 3 consecutive times without medical reason, if treatment goals not met, if there is a change in medical status, if patient makes no progress towards goals or if patient is discharged from hospital.  The above assessment, treatment plan, treatment alternatives and goals were discussed and mutually agreed upon: by patient  CAlger Simons6/30/2023, 11:59 AM

## 2022-05-22 NOTE — Progress Notes (Signed)
[facility] Individual Statement of Services  Patient Name:  Diamond Adams  Date:  05/22/2022  Welcome to the [facility].  Our goal is to provide you with an individualized program based on your diagnosis and situation, designed to meet your specific needs.  With this comprehensive rehabilitation program, you will be expected to participate in at least 3 hours of rehabilitation therapies Monday-Friday, with modified therapy programming on the weekends.  Your rehabilitation program will include the following services:  Physical Therapy (PT), Occupational Therapy (OT), Speech Therapy (ST), 24 hour per day rehabilitation nursing, Therapeutic Recreaction (TR), Neuropsychology, Care Coordinator, Rehabilitation Medicine, Nutrition Services, and Pharmacy Services  Weekly team conferences will be held on Wednesday to discuss your progress.  Your Inpatient Rehabilitation Care Coordinator will talk with you frequently to get your input and to update you on team discussions.  Team conferences with you and your family in attendance may also be held.  Expected length of stay: 5-7 days  Overall anticipated outcome: Independent with device  Depending on your progress and recovery, your program may change. Your Inpatient Rehabilitation Care Coordinator will coordinate services and will keep you informed of any changes. Your Inpatient Rehabilitation Care Coordinator's name and contact numbers are listed  below.  The following services may also be recommended but are not provided by the [facility] will be made to provide these services after discharge if needed.  Arrangements include referral to agencies that provide these services.  Your insurance has been verified to be:  [employer]-Medicare Your primary doctor is:  Metallurgist  Pertinent  information will be shared with your doctor and your insurance company.  Inpatient Rehabilitation Care Coordinator:  Ovidio Kin, [place] or Emilia Beck  Information discussed with and copy given to patient by: Elease Hashimoto, 05/22/2022, 10:45 AM

## 2022-05-23 DIAGNOSIS — I63511 Cerebral infarction due to unspecified occlusion or stenosis of right middle cerebral artery: Secondary | ICD-10-CM | POA: Diagnosis not present

## 2022-05-23 LAB — CULTURE, BLOOD (ROUTINE X 2)
Culture: NO GROWTH
Culture: NO GROWTH
Special Requests: ADEQUATE
Special Requests: ADEQUATE

## 2022-05-23 NOTE — Progress Notes (Signed)
Occupational Therapy Session Note  Patient Details  Name: Diamond Adams MRN: [id number] Date of Birth: Sep 09, 1961  Today's Date: 05/23/2022 OT Individual Time: 1045-1200 OT Individual Time Calculation (min): 75 min    Short Term Goals: Week 1:  OT Short Term Goal 1 (Week 1): STGs = LTGs  Skilled Therapeutic Interventions/Progress Updates:    Upon OT arrival, pt seated in recliner reporting 5/10 pain in groin. Pt agreeable to OT treatment session. Treatment intervention with a focus on self care retraining, use of adaptive equipment, functional transfers, functional mobility, and IADLs. Pt completes shower ADL at the levels listed below. Pt able to retrieve items for ADL with Supervision using RW. Pt demonstrates min decreased safety lifting her leg up onto chair to dry after shower but reports she feels safe doing so. Pt able to gather her dirty clothes and place into a laundry bag with Supervision using RW. Pt uses sock aid to donn socks with good understanding of technique. Pt was transported to bathroom with tub/shower present via w/c and total A for time management to engage in tub transfer training. Pt was provided verbal and visual demonstration of both techniques but it was determined that the tub transfer bench would be safest. Therapist recommended non skid mat for shower as well. Pt completes functional mobility with RW to initiate returning back to her room. Pt able to ambulate ~125' with short seated rest break after first ~70'. Pt was transported back to her room via w/c and total A secondary to fatigue and ambulates from w/c to her recliner with Supervision using RW. Pt completes stand to sit transfer with Supervision using RW and was left in recliner with all needs met. Pt making progress towards stated OT goals and continues to benefit from OT services to achieve highest level of independence.   Therapy Documentation Precautions:  Precautions Precautions: Fall Restrictions Weight  Bearing Restrictions: No  ADL: ADL Eating: Independent Grooming: Supervision/safety Where Assessed-Grooming: Standing at sink Upper Body Bathing: Supervision/safety Where Assessed-Upper Body Bathing: Shower Lower Body Bathing: Supervision/safety Where Assessed-Lower Body Bathing: Shower Upper Body Dressing: Supervision/safety Where Assessed-Upper Body Dressing: Standing at sink Lower Body Dressing: Contact guard Where Assessed-Lower Body Dressing: Standing at sink, Sitting at sink Tub/Shower Transfer: Close supervison Tub/Shower Transfer Method: Ambulating Tub/Shower Equipment: Facilities manager: Close supervision Social research officer, government Method: Heritage manager: Grab bars, Transfer tub bench   Therapy/Group: Individual Therapy  Marvetta Gibbons 05/23/2022, 12:04 PM

## 2022-05-23 NOTE — Progress Notes (Signed)
PROGRESS NOTE   Subjective/Complaints:  Pt reports feeling lightheaded and "weak" in "head".  Walked to toilet OK- Orthostatics were done and was NOT orthostatic- Scared because had these Sx's on and off for 1 month and feels like they contributed to stroke.  Feels better when "head stable" but not particular to position otherwise.   Also having white lights in vision again.   No N/V , no vertigo.   ROS:  Pt denies SOB, abd pain, CP, N/V/C/D, and vision changes Except for as above  Objective:   No results found. Recent Labs    05/21/22 0242 05/22/22 0540  WBC 14.1* 12.1*  HGB 13.6 13.6  HCT 39.7 39.8  PLT 282 249   Recent Labs    05/21/22 0242 05/22/22 0540  NA 139 140  K 3.8 4.0  CL 111 105  CO2 20* 27  GLUCOSE 98 122*  BUN 17 13  CREATININE 0.85 0.65  CALCIUM 8.8* 9.0    Intake/Output Summary (Last 24 hours) at 05/23/2022 1015 Last data filed at 05/22/2022 2000 Gross per 24 hour  Intake 480 ml  Output --  Net 480 ml        Physical Exam: Vital Signs Blood pressure 139/72, pulse 70, temperature 98.2 F (36.8 C), resp. rate 14, height '4\' 10"'$  (1.473 m), weight 64.3 kg, SpO2 98 %.    General: awake, alert, appropriate, NAD HENT: conjugate gaze; oropharynx moist- L eye appears smaller/mild lid lag compared to R;  CV: regular rate and rhythm;  no JVD Pulmonary: CTA B/L; no W/R/R- good air movement GI: soft, NT, ND, (+)BS Psychiatric: appropriate but very anxious about "weak" feeling Neurological: alert  Musculoskeletal:     Cervical back: Neck supple. No tenderness.     Comments: Right groin puncture site with non-expanding hematoma. Soft to palpation. + ecchymosis medially In groin, upper thigh and lower RLQ- all purple bruising- looks the same/no change RUE 5/5 LUE 5-/5 throughout RLE_ 5-/5 proximally and 5/5 distally- due to pain LLE- 5-/5 throughout  Skin:    General: Skin is warm and  dry.     Comments: Right AC PIV    Right groin incision with significant bruising Neurological:     Mental Status: She is alert.     Comments: Intact to light touch in face and in all 4 limbs  Psychiatric:        Mood and Affect: Mood normal.        Behavior: Behavior normal.      Assessment/Plan: 1. Functional deficits which require 3+ hours per day of interdisciplinary therapy in a comprehensive inpatient rehab setting. Physiatrist is providing close team supervision and 24 hour management of active medical problems listed below. Physiatrist and rehab team continue to assess barriers to discharge/monitor patient progress toward functional and medical goals  Care Tool:  Bathing    Body parts bathed by patient: Right arm, Left arm, Chest, Abdomen, Front perineal area, Buttocks, Right upper leg, Left upper leg, Left lower leg, Face   Body parts bathed by helper: Right lower leg     Bathing assist Assist Level: Minimal Assistance - Patient > 75%     Upper  Body Dressing/Undressing Upper body dressing   What is the patient wearing?: Bra, Pull over shirt    Upper body assist Assist Level: Set up assist    Lower Body Dressing/Undressing Lower body dressing      What is the patient wearing?: Underwear/pull up, Pants     Lower body assist Assist for lower body dressing: Minimal Assistance - Patient > 75%     Toileting Toileting    Toileting assist Assist for toileting: Supervision/Verbal cueing     Transfers Chair/bed transfer  Transfers assist     Chair/bed transfer assist level: Contact Guard/Touching assist     Locomotion Ambulation   Ambulation assist      Assist level: Contact Guard/Touching assist Assistive device: Walker-rolling Max distance: 129f   Walk 10 feet activity   Assist     Assist level: Contact Guard/Touching assist Assistive device: Walker-rolling   Walk 50 feet activity   Assist    Assist level: Contact Guard/Touching  assist Assistive device: Walker-rolling    Walk 150 feet activity   Assist Walk 150 feet activity did not occur: Safety/medical concerns (fatigue)         Walk 10 feet on uneven surface  activity   Assist Walk 10 feet on uneven surfaces activity did not occur: Safety/medical concerns         Wheelchair     Assist Is the patient using a wheelchair?: No             Wheelchair 50 feet with 2 turns activity    Assist            Wheelchair 150 feet activity     Assist          Blood pressure 139/72, pulse 70, temperature 98.2 F (36.8 C), resp. rate 14, height '4\' 10"'$  (1.473 m), weight 64.3 kg, SpO2 98 %.    Medical Problem List and Plan: 1. 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  Antithrombotics: -DVT/anticoagulation:  Pharmaceutical: Heparin             -antiplatelet therapy: Plavix, aspirin 3. Right groin pain: Added Tylenol #3 for moderate pain. Tylenol, oxycodone as needed 4. Mood/Sleep: LCSW to evaluate and provide emotional support             -antipsychotic agents: n/a 5. Neuropsych/cognition: This patient is capable of making decisions on her own behalf. 6. Skin/Wound Care: Routine skin care checks 7. Fluids/Electrolytes/Nutrition: I's and O's and follow-up chemistries 8: Right carotid artery stenosis: Planning right TCAR 7/6 9: History of depression: started on Prozac  10: Anxiety disorder: Continue alprazolam nightly 7/1- pt anxious that Sx's are the cause of her stroke- explained if had 1 month, I'm not convinced they are related.  11: Hyperlipidemia: Continue Crestor (at home on Pravachol 40 mg daily) 12: Essential hypertension: holding home Zestoretic 20/25 q AS and qHS as well as Toprol XL 12.5 mg daily . Add magnesium gluconate '250mg'$  HS  7/1- BP high normal/con't to  monitor trend 13: Tobacco use disorder: Advised regarding cessation.  She does report cravings at this time. 14: GERD: Continue Protonix 15. Screening for vitamin D deficiency: added on to today's labs, f/u tomorrow  7/1- Vit D Level 35.76-  con't to monitor    I spent a total of 41   minutes on total care today- >50% coordination of care- due to d/w nursing 2x about BP/orthostatics and helping assuage pt's concerns about Sx's.    LOS: 2 days A FACE TO FACE EVALUATION WAS PERFORMED  Kaitlynd Phillips 05/23/2022, 10:15 AM

## 2022-05-23 NOTE — Plan of Care (Signed)
  Problem: Consults Goal: RH STROKE PATIENT EDUCATION Description: See Patient Education module for education specifics  Outcome: Progressing   Problem: RH SKIN INTEGRITY Goal: RH STG MAINTAIN SKIN INTEGRITY WITH ASSISTANCE Description: STG Maintain Skin Integrity With Mod I Assistance. Outcome: Progressing   Problem: RH SAFETY Goal: RH STG ADHERE TO SAFETY PRECAUTIONS W/ASSISTANCE/DEVICE Description: STG Adhere to Safety Precautions With Cues and Reminders. Outcome: Progressing Goal: RH STG DECREASED RISK OF FALL WITH ASSISTANCE Description: STG Decreased Risk of Fall With Mod I Assistance. Outcome: Progressing   Problem: RH COGNITION-NURSING Goal: RH STG USES MEMORY AIDS/STRATEGIES W/ASSIST TO PROBLEM SOLVE Description: STG Uses Memory Aids/Strategies With Mod I Assistance to Problem Solve. Outcome: Progressing Goal: RH STG ANTICIPATES NEEDS/CALLS FOR ASSIST W/ASSIST/CUES Description: STG Anticipates Needs/Calls for Assist With Cues and Reminders. Outcome: Progressing   Problem: RH PAIN MANAGEMENT Goal: RH STG PAIN MANAGED AT OR BELOW PT'S PAIN GOAL Description: Patient to report pain at < 3 on a 0-10 pain scale. Outcome: Progressing   Problem: RH KNOWLEDGE DEFICIT Goal: RH STG INCREASE KNOWLEDGE OF STROKE PROPHYLAXIS Description: Patient will demonstrate knowledge of medications used to prevent future strokes with educational materials and handouts provided by staff independently at discharge. Outcome: Progressing

## 2022-05-23 NOTE — Progress Notes (Signed)
Physical Therapy Session Note  Patient Details  Name: Diamond Adams MRN: [id number] Date of Birth: 1961/03/04  Today's Date: 05/23/2022 PT Individual Time: 0910-1020 and 1303-1400 PT Individual Time Calculation (min): 70 min and 57 min   Short Term Goals: Week 1:  PT Short Term Goal 1 (Week 1): STG = LTG due to ELOS  Skilled Therapeutic Interventions/Progress Updates:  Session 1  Pt received supine in bed and agreeable to PT. Supine>sit transfer without assist from PT, but pt reports mild dizziness.  Orthostatic VS. Supine: 108/49. HR 53. sitting 116/104 HR 59.  Sitting 30 sec. 131/64 HR 56. Standing 115/67, HR 60. Sitting in WC 142/55, HR 55.   Gait in room to toilet with RW and supervision assist with cues for AD management up/down ramp into bathroom. Toilet transfers performed without assist and pt able to perform all clothing management and personal hygiene.   Standing balance while engaged in wii sports bowling with 1 UE support x 5 frames and supervision assist and no UE support x 5 frames with intermittently min assist due to posterior LOB.  and Wii fit balance toable tilt x 2 with UE supported on RW for first bout and no UE supported on the second. Peguin slide x 2 bouts with 1 bout of RW support and 1 without UE support. Min assist throughout for improved use of ankle strategy for COM control and to prevent posterior LOB intermittently. .   Patient returned to room and performed stand pivot to recliner with RW and supervision assist. Pt left sitting in recliner with call bell in reach and all needs met.    Session 2.   Pt received sitting in recliner and agreeable to PT. Pt performed ambulatory transfer with RW and supervision assist for safety from PT.  Pt transported to rehab gym. Sit<>stand transfers and gait with QC 2x 39f with min assist for safety. Attempted gait training with SPC, but pt reports feeling unsafe with SPC vs QC. Additional gait training with Rollator x 258f with supervision assist, but pt noted to have decreased speed, cadence and increased UE use with rollator over RW.   Stair management training x 2 steps(3") with LUE only on rail and CGA. 2 steps QC and HHA on 3" step and then ascend/descend 4 steps(6") with HHA and QC to simulate access to home. Pts LUE supported on PT shoudler for descent.   Pt reports feeling tight gastroc on steps. Standing calf stretch in parallel bars x 30 sec bil, but mild irritation noted in groin at surgical site. Pt returned to room and performed ambulatory transfer to toilet with RW and supervision assist for safety up/down threshold. Hygiene performed by PT without assist from PT. Ambulatory  transfer to bed with RW and supervision assist for safety. Sit>supine completed with supervision assist through long sitting due to pain in groin, but pt does not rate. Pt  left supine in bed with call bell in reach and all needs met.        Therapy Documentation Precautions:  Precautions Precautions: Fall Restrictions Weight Bearing Restrictions: No  Vital Signs: Therapy Vitals Pulse Rate: 70 BP: 139/72 Patient Position (if appropriate): Standing Pain: Session 1  Pain Assessment Pain Scale: 0-10 Pain Score: 4  Pain Type: Acute pain;Surgical pain Pain Location: Groin Pain Orientation: Right Pain Descriptors / Indicators: Aching Pain Frequency: Intermittent Pain Onset: On-going Patients Stated Pain Goal: 0 Pain Intervention(s): Medication (See eMAR) Session 2.  Pain Assessment Pain Scale: 0-10 Pain  Score: 4  Pain Type: Acute pain;Surgical pain Pain Location: Groin Pain Orientation: Right Pain Descriptors / Indicators: Aching Pain Frequency: Intermittent Pain Onset: On-going Patients Stated Pain Goal: 0 Pain Intervention(s): Medication (See eMAR)    Therapy/Group: Individual Therapy  Diamond Adams 05/23/2022, 10:21 AM

## 2022-05-24 DIAGNOSIS — I63511 Cerebral infarction due to unspecified occlusion or stenosis of right middle cerebral artery: Secondary | ICD-10-CM | POA: Diagnosis not present

## 2022-05-24 NOTE — Progress Notes (Signed)
PROGRESS NOTE   Subjective/Complaints:   Pt reports feeling a lot better today- no dizziness/weakness feeling and white lights better.  Therapy went OK yesterday On toilet- trying ot have BM/void.     ROS:   Pt denies SOB, abd pain, CP, N/V/C/D, and vision changes   Objective:   No results found. Recent Labs    05/22/22 0540  WBC 12.1*  HGB 13.6  HCT 39.8  PLT 249   Recent Labs    05/22/22 0540  NA 140  K 4.0  CL 105  CO2 27  GLUCOSE 122*  BUN 13  CREATININE 0.65  CALCIUM 9.0    Intake/Output Summary (Last 24 hours) at 05/24/2022 0943 Last data filed at 05/24/2022 0714 Gross per 24 hour  Intake 1254 ml  Output --  Net 1254 ml        Physical Exam: Vital Signs Blood pressure (!) 138/56, pulse 61, temperature 98.1 F (36.7 C), temperature source Oral, resp. rate 17, height '4\' 10"'$  (1.473 m), weight 64.3 kg, SpO2 96 %.     General: awake, alert, appropriate, sitting on toilet; NAD HENT: conjugate gaze; oropharynx moist CV: regular rate; no JVD Pulmonary: CTA B/L; no W/R/R- good air movement GI: soft, NT, ND, (+)BS Psychiatric: appropriate Neurological: Ox3- slightly delayed responses  Musculoskeletal:     Cervical back: Neck supple. No tenderness.     Comments: Right groin puncture site with non-expanding hematoma. Soft to palpation. + ecchymosis medially In groin, upper thigh and lower RLQ- all purple bruising- looks the same/no change RUE 5/5 LUE 5-/5 throughout RLE_ 5-/5 proximally and 5/5 distally- due to pain LLE- 5-/5 throughout  Skin:    General: Skin is warm and dry.     Comments: Right AC PIV    Right groin incision with significant bruising- around R upper thigh and R lower abdomen- but purple bruising less dark Neurological:     Mental Status: She is alert.     Comments: Intact to light touch in face and in all 4 limbs  Psychiatric:        Mood and Affect: Mood normal.         Behavior: Behavior normal.      Assessment/Plan: 1. Functional deficits which require 3+ hours per day of interdisciplinary therapy in a comprehensive inpatient rehab setting. Physiatrist is providing close team supervision and 24 hour management of active medical problems listed below. Physiatrist and rehab team continue to assess barriers to discharge/monitor patient progress toward functional and medical goals  Care Tool:  Bathing    Body parts bathed by patient: Right arm, Left arm, Chest, Abdomen, Front perineal area, Buttocks, Right upper leg, Left upper leg, Left lower leg, Face, Right lower leg   Body parts bathed by helper: Right lower leg     Bathing assist Assist Level: Contact Guard/Touching assist     Upper Body Dressing/Undressing Upper body dressing   What is the patient wearing?: Dress    Upper body assist Assist Level: Supervision/Verbal cueing    Lower Body Dressing/Undressing Lower body dressing      What is the patient wearing?: Underwear/pull up, Pants     Lower body  assist Assist for lower body dressing: Contact Guard/Touching assist     Toileting Toileting    Toileting assist Assist for toileting: Supervision/Verbal cueing     Transfers Chair/bed transfer  Transfers assist     Chair/bed transfer assist level: Contact Guard/Touching assist     Locomotion Ambulation   Ambulation assist      Assist level: Contact Guard/Touching assist Assistive device: Walker-rolling Max distance: 160f   Walk 10 feet activity   Assist     Assist level: Contact Guard/Touching assist Assistive device: Walker-rolling   Walk 50 feet activity   Assist    Assist level: Contact Guard/Touching assist Assistive device: Walker-rolling    Walk 150 feet activity   Assist Walk 150 feet activity did not occur: Safety/medical concerns (fatigue)         Walk 10 feet on uneven surface  activity   Assist Walk 10 feet on uneven surfaces  activity did not occur: Safety/medical concerns         Wheelchair     Assist Is the patient using a wheelchair?: No             Wheelchair 50 feet with 2 turns activity    Assist            Wheelchair 150 feet activity     Assist          Blood pressure (!) 138/56, pulse 61, temperature 98.1 F (36.7 C), temperature source Oral, resp. rate 17, height '4\' 10"'$  (1.473 m), weight 64.3 kg, SpO2 96 %.    Medical Problem List and Plan: 1. 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  Antithrombotics: -DVT/anticoagulation:  Pharmaceutical: Heparin             -antiplatelet therapy: Plavix, aspirin 3. Right groin pain: Added Tylenol #3 for moderate pain. Tylenol, oxycodone as needed  7/2- pain controlled with meds- con't regimen 4. Mood/Sleep: LCSW to evaluate and provide emotional support             -antipsychotic agents: n/a 5. Neuropsych/cognition: This patient is capable of making decisions on her own behalf. 6. Skin/Wound Care: Routine skin care checks 7. Fluids/Electrolytes/Nutrition: I's and O's and follow-up chemistries 8: Right carotid artery stenosis: Planning right TCAR 7/6 9: History of depression: started on Prozac  10: Anxiety disorder: Continue alprazolam nightly 7/1- pt anxious that Sx's are the cause of her stroke- explained if had 1 month, I'm not convinced they are related.  7/2- feeling much better today- Sx's resolved for now 11: Hyperlipidemia: Continue Crestor (at home on Pravachol 40 mg daily) 12: Essential hypertension: holding home Zestoretic 20/25 q AS and qHS as well as Toprol XL 12.5 mg daily . Add magnesium gluconate '250mg'$  HS  7/1- BP high normal/con't to monitor trend  7/2- BP well controlled- con't reigmen 13: Tobacco use disorder: Advised regarding cessation.  She does  report cravings at this time. 14: GERD: Continue Protonix 15. Screening for vitamin D deficiency: added on to today's labs, f/u tomorrow  7/1- Vit D Level 35.76- con't to monitor     LOS: 3 days A FACE TO FACE EVALUATION WAS PERFORMED  Diamond Adams 05/24/2022, 9:43 AM

## 2022-05-24 NOTE — Plan of Care (Signed)
  Problem: Consults Goal: RH STROKE PATIENT EDUCATION Description: See Patient Education module for education specifics  Outcome: Progressing   Problem: RH SKIN INTEGRITY Goal: RH STG MAINTAIN SKIN INTEGRITY WITH ASSISTANCE Description: STG Maintain Skin Integrity With Mod I Assistance. Outcome: Progressing   Problem: RH SAFETY Goal: RH STG ADHERE TO SAFETY PRECAUTIONS W/ASSISTANCE/DEVICE Description: STG Adhere to Safety Precautions With Cues and Reminders. Outcome: Progressing Goal: RH STG DECREASED RISK OF FALL WITH ASSISTANCE Description: STG Decreased Risk of Fall With Mod I Assistance. Outcome: Progressing   Problem: RH COGNITION-NURSING Goal: RH STG USES MEMORY AIDS/STRATEGIES W/ASSIST TO PROBLEM SOLVE Description: STG Uses Memory Aids/Strategies With Mod I Assistance to Problem Solve. Outcome: Progressing Goal: RH STG ANTICIPATES NEEDS/CALLS FOR ASSIST W/ASSIST/CUES Description: STG Anticipates Needs/Calls for Assist With Cues and Reminders. Outcome: Progressing   Problem: RH PAIN MANAGEMENT Goal: RH STG PAIN MANAGED AT OR BELOW PT'S PAIN GOAL Description: Patient to report pain at < 3 on a 0-10 pain scale. Outcome: Progressing   Problem: RH KNOWLEDGE DEFICIT Goal: RH STG INCREASE KNOWLEDGE OF STROKE PROPHYLAXIS Description: Patient will demonstrate knowledge of medications used to prevent future strokes with educational materials and handouts provided by staff independently at discharge. Outcome: Progressing

## 2022-05-25 DIAGNOSIS — D649 Anemia, unspecified: Secondary | ICD-10-CM | POA: Diagnosis not present

## 2022-05-25 DIAGNOSIS — E876 Hypokalemia: Secondary | ICD-10-CM | POA: Diagnosis not present

## 2022-05-25 DIAGNOSIS — F411 Generalized anxiety disorder: Secondary | ICD-10-CM | POA: Diagnosis not present

## 2022-05-25 DIAGNOSIS — I63511 Cerebral infarction due to unspecified occlusion or stenosis of right middle cerebral artery: Secondary | ICD-10-CM | POA: Diagnosis not present

## 2022-05-25 LAB — BASIC METABOLIC PANEL
Anion gap: 8 (ref 5–15)
BUN: 10 mg/dL (ref 6–20)
CO2: 26 mmol/L (ref 22–32)
Calcium: 8.9 mg/dL (ref 8.9–10.3)
Chloride: 105 mmol/L (ref 98–111)
Creatinine, Ser: 0.69 mg/dL (ref 0.44–1.00)
GFR, Estimated: >60 mL/min (ref >60)
Glucose, Bld: 126 mg/dL — ABNORMAL HIGH (ref 70–99)
Potassium: 3.3 mmol/L — ABNORMAL LOW (ref 3.5–5.1)
Sodium: 139 mmol/L (ref 135–145)

## 2022-05-25 LAB — CBC
HCT: 35.5 % — ABNORMAL LOW (ref 36.0–46.0)
Hemoglobin: 11.9 g/dL — ABNORMAL LOW (ref 12.0–15.0)
MCH: 28.8 pg (ref 26.0–34.0)
MCHC: 33.5 g/dL (ref 30.0–36.0)
MCV: 86 fL (ref 80.0–100.0)
Platelets: 262 10*3/uL (ref 150–400)
RBC: 4.13 MIL/uL (ref 3.87–5.11)
RDW: 13 % (ref 11.5–15.5)
WBC: 10.2 10*3/uL (ref 4.0–10.5)
nRBC: 0 % (ref 0.0–0.2)

## 2022-05-25 MED ORDER — BLOOD PRESSURE CONTROL BOOK
Freq: Once | Status: AC
  Filled 2022-05-25: qty 1

## 2022-05-25 MED ORDER — POTASSIUM CHLORIDE CRYS ER 20 MEQ PO TBCR
20.0000 meq | EXTENDED_RELEASE_TABLET | Freq: Two times a day (BID) | ORAL | Status: DC
Start: 2022-05-25 — End: 2022-05-26
  Administered 2022-05-25: 20 meq via ORAL
  Filled 2022-05-25 (×2): qty 1

## 2022-05-25 MED ORDER — EXERCISE FOR HEART AND HEALTH BOOK
Freq: Once | Status: AC
  Filled 2022-05-25: qty 1

## 2022-05-25 NOTE — Progress Notes (Addendum)
Patient ID: Diamond Adams, female   DOB: [birth year], 61 y.o.   MRN: [id number] Therapy team feels pt is close to reaching her goals and will be ready sooner than surgery on 7/6. Sandra-PA reaching out to [facility] regarding possibly discharging pt home and then have her come back for surgery. Await medical input  1:14 PM Have let pt know this information and have ordered her a youth rolling walker via [employer]. Also a 3 in 1  2:21 PM team feels met goals and ready for discharge tomorrow. Have ordered DME. Pt made mod/I in room today. She will have a friend come and transport her home tomorrow. Aware still awaiting MD to approve discharge.

## 2022-05-25 NOTE — Patient Care Conference (Signed)
Inpatient RehabilitationTeam Conference and Plan of Care Update Date: 05/26/2022   Time: 14:55 PM    Patient Name: Diamond Adams      Medical Record Number: [id number]  Date of Birth: 1961/01/06 Sex: Female         Room/Bed: 4W01C/4W01C-01 Payor Info: Payor: [employer] MEDICARE / Plan: [facility] MEDICARE / Product Type: *No Product type* /    Admit Date/Time:  05/21/2022  3:44 PM  Primary Diagnosis:  Acute ischemic right MCA stroke [facility])  Hospital Problems: Principal Problem:   Acute ischemic right MCA stroke [facility]) Active Problems:   Acute right MCA stroke [facility])   Anxiety state    Expected Discharge Date: Expected Discharge Date: 05/26/22  Team Members Present: Physician leading conference: Dr. Frederica Kuster (comment) Risa Grill, [facility]) Social Worker Present: Ovidio Kin, LCSW Nurse Present: Dorien Chihuahua, RN PT Present: Ginnie Smart, PT OT Present: Other (comment) Lonn Georgia Hirschfelder, OT)     Current Status/Progress Goal Weekly Team Focus  Bowel/Bladder   continent of bowel and bladder  remain continent of bowel and bladder  reasses q shift   Swallow/Nutrition/ Hydration     N/a        ADL's   mod I BADL's and basic toilet and shower transfer with DME  mod I  progressed with standing balance and safety for BADL's and mobility   Mobility   goal level - mod I with RW  mod I  DC planning, safety training, gait training, dynamic balance, pain management   Communication     N/a        Safety/Cognition/ Behavioral Observations    N/a        Pain   currently 2/10 pain  become pain free  reassess pain q shift   Skin   currently some ecchymosis in lower abdomen and groin.  to prevent any new ecchymosis  assess q shift     Discharge Planning:  Home with daughter who works outside of the home, pt is mod/i and safe to ambulate on own. Will have surgery 7/6.   Team Discussion: Patient is doing well, no need to stay in the hospital until 7/6 for TCAR  procedure  Patient on target to meet rehab goals: yes, made Mod I in room 05/25/22 in prep for discharge on 05/26/22.  Patient had improved activity tolerance, improved balance, postural control, ability to compensate for deficits, functional use of  LEFT upper and LEFT lower extremity, and improved coordination.  Patient to discharge at overall Modified Independent level.  Pt will have supervision from her daughter after she gets home from work  *[place] and progress notes for long and short-term goals.   Revisions to Treatment Plan:  N/a   Teaching Needs: Safety, medications, dietary modifications, transfers, skin care, etc  Current Barriers to Discharge: Decreased caregiver support and Home enviroment access/layout  Possible Resolutions to Barriers: OP follow up services DME: RW and 3N1     Medical Summary               I attest that I was present, lead the team conference, and concur with the assessment and plan of the team.   Dorien Chihuahua B 05/26/2022, 8:09 AM

## 2022-05-25 NOTE — Discharge Instructions (Addendum)
Inpatient Rehab Discharge Instructions  Diamond Adams Discharge date and time: 05/26/2022   Activities/Precautions/ Functional Status: Activity: no lifting, driving, or strenuous exercise until cleared by MD Diet: cardiac diet Wound Care: none needed Functional status:  ___ No restrictions     ___ Walk up steps independently ___ 24/7 supervision/assistance   ___ Walk up steps with assistance __x_ Intermittent supervision/assistance  ___ Bathe/dress independently ___ Walk with walker     ___ Bathe/dress with assistance ___ Walk Independently    ___ Shower independently ___ Walk with assistance    __x_ Shower with assistance _x__ No alcohol     ___ Return to work/school ________  Special Instructions:  No driving, alcohol consumption or tobacco use.   Nothing to eat or drink after midnight on 05/27/2022. Other pre-op instructions as directed by Dr. Osie Cheeks office.  DO NOT resume home blood pressure medications. Take your BP at home once-twice a day in the same arm and record measurement and time of day. Take this information with you to all follow-up appointments.   COMMUNITY REFERRALS UPON DISCHARGE:    HOME EXERCISE PROGRAM GIVEN TO PATIENT AT THIS TIME NO FOLLOW UP RECOMMENDED COULD AFTER HER SURGERY ON 7/6    Medical Equipment/Items Ordered: ROLLING WALKER AND 3 IN 1                                                 Agency/Supplier:[facility]  [id number]    STROKE/TIA DISCHARGE INSTRUCTIONS SMOKING Cigarette smoking nearly doubles your risk of having a stroke & is the single most alterable risk factor  If you smoke or have smoked in the last 12 months, you are advised to quit smoking for your health. Most of the excess cardiovascular risk related to smoking disappears within a year of stopping. Ask you doctor about anti-smoking medications [place] Quit Line: 1-800-QUIT NOW Free Smoking Cessation Classes [phone]  CHOLESTEROL Know your levels; limit fat & cholesterol in your  diet  Lipid Panel     Component Value Date/Time   CHOL 138 05/18/2022 0410   TRIG 75 05/18/2022 0410   HDL 40 (L) 05/18/2022 0410   CHOLHDL 3.5 05/18/2022 0410   VLDL 15 05/18/2022 0410   LDLCALC 83 05/18/2022 0410     Many patients benefit from treatment even if their cholesterol is at goal. Goal: Total Cholesterol (CHOL) less than 160 Goal:  Triglycerides (TRIG) less than 150 Goal:  HDL greater than 40 Goal:  LDL (LDLCALC) less than 100   BLOOD PRESSURE American Stroke Association blood pressure target is less that 120/80 mm/Hg  Your discharge blood pressure is:  BP: (!) 140/50 Monitor your blood pressure Limit your salt and alcohol intake Many individuals will require more than one medication for high blood pressure  DIABETES (A1c is a blood sugar average for last 3 months) Goal HGBA1c is under 7% (HBGA1c is blood sugar average for last 3 months)  Diabetes: No known diagnosis of diabetes    Lab Results  Component Value Date   HGBA1C 5.4 05/18/2022    Your HGBA1c can be lowered with medications, healthy diet, and exercise. Check your blood sugar as directed by your physician Call your physician if you experience unexplained or low blood sugars.  PHYSICAL ACTIVITY/REHABILITATION Goal is 30 minutes at least 4 days per week  Activity: Increase activity slowly, Therapies:  Physical Therapy: TBD and Occupational Therapy: TBD Return to work: when cleared by MD Activity decreases your risk of heart attack and stroke and makes your heart stronger.  It helps control your weight and blood pressure; helps you relax and can improve your mood. Participate in a regular exercise program. Talk with your doctor about the best form of exercise for you (dancing, walking, swimming, cycling).  DIET/WEIGHT Goal is to maintain a healthy weight  Your discharge diet is:  Diet Order             Diet regular Room service appropriate? Yes; Fluid consistency: Thin  Diet effective now                   thin liquids Your height is:  Height: '4\' 10"'$  (147.3 cm) Your current weight is: Weight: 64.3 kg Your Body Mass Index (BMI) is:  BMI (Calculated): 29.64 Following the type of diet specifically designed for you will help prevent another stroke. Your goal weight range is:   Your goal Body Mass Index (BMI) is 19-24. Healthy food habits can help reduce 3 risk factors for stroke:  High cholesterol, hypertension, and excess weight.  RESOURCES Stroke/Support Group:  Call [phone]   STROKE EDUCATION PROVIDED/REVIEWED AND GIVEN TO PATIENT Stroke warning signs and symptoms How to activate emergency medical system (call 911). Medications prescribed at discharge. Need for follow-up after discharge. Personal risk factors for stroke. Pneumonia vaccine given: No Flu vaccine given: No My questions have been answered, the writing is legible, and I understand these instructions.  I will adhere to these goals & educational materials that have been provided to me after my discharge from the hospital.     My questions have been answered and I understand these instructions. I will adhere to these goals and the provided educational materials after my discharge from the hospital.  Patient/Caregiver Signature _______________________________ Date __________  Clinician Signature _______________________________________ Date __________  Please bring this form and your medication list with you to all your follow-up doctor's appointments.

## 2022-05-25 NOTE — Discharge Summary (Signed)
Physical Therapy Discharge Summary  Patient Details  Name: Diamond Adams MRN: [id number] Date of Birth: 07-01-1961   Patient has met 9 of 9 long term goals due to improved activity tolerance, improved balance, improved postural control, increased strength, decreased pain, ability to compensate for deficits, improved attention, and improved awareness.  Patient to discharge at an ambulatory level Modified Independent.   Patient's care partner is independent to provide the necessary physical assistance at discharge.  Reasons goals not met: n/a  Recommendation:  Patient will benefit from ongoing skilled PT services in outpatient setting to continue to advance safe functional mobility, address ongoing impairments in dynamic standing balance, general strengthening, and minimize fall risk.  Equipment: Youth sized RW  Reasons for discharge: treatment goals met and discharge from hospital  Patient/family agrees with progress made and goals achieved: Yes  PT Discharge Precautions/Restrictions Precautions Precautions: None Restrictions Weight Bearing Restrictions: No Pain Interference Pain Interference Pain Effect on Sleep: 3. Frequently Pain Interference with Therapy Activities: 3. Frequently Pain Interference with Day-to-Day Activities: 3. Frequently Vision/Perception  Vision - History Ability to See in Adequate Light: 0 Adequate Vision - Assessment Eye Alignment: Within Functional Limits Ocular Range of Motion: Within Functional Limits Perception Perception: Within Functional Limits Praxis Praxis: Intact  Cognition Overall Cognitive Status: History of cognitive impairments - at baseline Arousal/Alertness: Awake/alert Orientation Level: Oriented X4 Attention: Alternating Focused Attention: Appears intact Sustained Attention: Appears intact Memory: Impaired Memory Impairment: Decreased short term memory Decreased Short Term Memory: Verbal basic Awareness: Appears  intact Problem Solving: Appears intact Reasoning: Appears intact Safety/Judgment: Appears intact Sensation Sensation Light Touch: Appears Intact Hot/Cold: Appears Intact Proprioception: Appears Intact Stereognosis: Appears Intact Coordination Gross Motor Movements are Fluid and Coordinated: Yes Fine Motor Movements are Fluid and Coordinated: Yes Motor  Motor Motor: Hemiplegia;Within Functional Limits Motor - Discharge Observations: mild L hemi  Mobility Bed Mobility Bed Mobility: Supine to Sit;Sit to Supine Supine to Sit: Independent Sit to Supine: Independent Transfers Transfers: Sit to Stand;Stand to Sit;Stand Pivot Transfers Sit to Stand: Independent with assistive device Stand to Sit: Independent with assistive device Stand Pivot Transfers: Independent with assistive device Transfer (Assistive device): Rolling walker Locomotion  Gait Ambulation: Yes Gait Assistance: Independent with assistive device Gait Distance (Feet): 150 Feet Assistive device: Rolling walker Gait Gait: Yes Gait Pattern: Impaired Gait Pattern: Antalgic Stairs / Additional Locomotion Stairs: Yes Stairs Assistance: Supervision/Verbal cueing Stair Management Technique: Two rails Number of Stairs: 12 Height of Stairs: 6 Pick up small object from the floor assist level: Supervision/Verbal cueing Wheelchair Mobility Wheelchair Mobility: No  Trunk/Postural Assessment  Cervical Assessment Cervical Assessment: Within Functional Limits Thoracic Assessment Thoracic Assessment: Within Functional Limits Lumbar Assessment Lumbar Assessment: Within Functional Limits Postural Control Postural Control: Within Functional Limits  Balance Balance Balance Assessed: Yes Static Sitting Balance Static Sitting - Balance Support: No upper extremity supported Static Sitting - Level of Assistance: 7: Independent Dynamic Sitting Balance Dynamic Sitting - Balance Support: No upper extremity supported Dynamic  Sitting - Level of Assistance: 6: Modified independent (Device/Increase time) Static Standing Balance Static Standing - Balance Support: No upper extremity supported Static Standing - Level of Assistance: 6: Modified independent (Device/Increase time) Dynamic Standing Balance Dynamic Standing - Balance Support: No upper extremity supported Dynamic Standing - Level of Assistance: 6: Modified independent (Device/Increase time) Extremity Assessment  RUE Assessment RUE Assessment: Within Functional Limits LUE Assessment LUE Assessment: Within Functional Limits RLE Assessment RLE Assessment: Within Functional Limits LLE Assessment LLE Assessment: Exceptions to [facility] General  Strength Comments: Grossly 4+/5    Ante Arredondo P Emireth Cockerham PT 05/25/2022, 3:32 PM

## 2022-05-25 NOTE — Progress Notes (Signed)
Occupational Therapy Session Note  Patient Details  Name: Diamond Adams MRN: [id number] Date of Birth: [birth year]  Today's Date: 05/25/2022 OT Missed Time: 22 Minutes Missed Time Reason: Patient fatigue   Short Term Goals: Week 1:  OT Short Term Goal 1 (Week 1): STGs = LTGs  Skilled Therapeutic Interventions/Progress Updates:    Pt received in bed stating she was really exhausted from earlier sessions. Pt declined any further exercises. Reviewed briefly with pt her goals and pt feels great about meeting them and is ready to go home tomorrow!  Therapy Documentation Precautions:  Precautions Precautions: Fall Restrictions Weight Bearing Restrictions: No     Therapy/Group: Individual Therapy  Erasmus Bistline 05/25/2022, 12:53 PM

## 2022-05-25 NOTE — Progress Notes (Signed)
Occupational Therapy Session Note  Patient Details  Name: Diamond Adams MRN: [id number] Date of Birth: 05-06-1961  Today's Date: 05/25/2022 OT Individual Time: 1115-1200 OT Individual Time Calculation (min): 45 min    Short Term Goals: Week 1:  OT Short Term Goal 1 (Week 1): STGs = LTGs  Skilled Therapeutic Interventions/Progress Updates:  Pt seen for skilled OT session this AM with focus on progression of visual skills and overall graded control with standing balance and reaching in kitchen for light items. Pt needed to use bathroom for toileting prior to exiting room and was able to amb with RW with mod I and perform all aspects of clothing management and peri hygiene as well as access sink for hand washing. Pt transported via w/c for time management to demo kitchen area and performed visual scanning task with VF and tracking integration while reaching in standing unilaterally. Excursions up to 6-8" obos x 4 trials up to 5 minutes in standing with seated rests between trials. Pt was able to self-monitor need for rest and perform tasks with mod I. Pt returned back to room for lunch meal and left with all needs and safety measures in place.     Therapy Documentation Precautions:  Precautions Precautions: Fall Restrictions Weight Bearing Restrictions: No   Therapy/Group: Individual Therapy  Barnabas Lister 05/25/2022, 9:13 AM

## 2022-05-25 NOTE — Progress Notes (Signed)
Occupational Therapy Discharge Summary  Patient Details  Name: EVERLINE MAHAFFY MRN: [id number] Date of Birth: [birth year]   Patient has met 9 of 9 long term goals due to improved activity tolerance, improved balance, postural control, ability to compensate for deficits, functional use of  LEFT upper and LEFT lower extremity, and improved coordination.  Patient to discharge at overall Modified Independent level.  Pt will have supervision from her daughter after she gets home from work.   Reasons goals not met: All goals met.   Recommendation:  No further OT needs.   Equipment: TTB  Reasons for discharge: treatment goals met and discharge from hospital  Patient/family agrees with progress made and goals achieved: Yes  OT Discharge Precautions/Restrictions  Precautions Precautions: None Restrictions Weight Bearing Restrictions: No    ADL ADL Eating: Independent Grooming: Independent Where Assessed-Grooming: Standing at sink Upper Body Bathing: Modified independent Where Assessed-Upper Body Bathing: Shower Lower Body Bathing: Modified independent Where Assessed-Lower Body Bathing: Shower Upper Body Dressing: Modified independent (Device) Where Assessed-Upper Body Dressing: Standing at sink Lower Body Dressing: Modified independent Where Assessed-Lower Body Dressing: Standing at sink, Sitting at sink Toileting: Modified independent Where Assessed-Toileting: Glass blower/designer: Diplomatic Services operational officer Method: Counselling psychologist: Energy manager: Modified independent Clinical cytogeneticist Method: Optometrist: Facilities manager: Modified independent Social research officer, government Method: Heritage manager: Grab bars, Gaffer Baseline Vision/History: 1 Wears glasses Patient Visual Report: No change from baseline Vision Assessment?: No apparent visual  deficits Perception  Perception: Within Functional Limits Praxis Praxis: Intact Cognition Cognition Overall Cognitive Status: History of cognitive impairments - at baseline Arousal/Alertness: Awake/alert Orientation Level: Person;Place;Situation Person: Oriented Place: Oriented Situation: Oriented Memory: Impaired Memory Impairment: Decreased short term memory Decreased Short Term Memory: Verbal basic Attention: Alternating Awareness: Appears intact Problem Solving: Appears intact Safety/Judgment: Appears intact Brief Interview for Mental Status (BIMS) Repetition of Three Words (First Attempt): 3 Temporal Orientation: Year: Correct Temporal Orientation: Month: Accurate within 5 days Temporal Orientation: Day: Correct Recall: "Sock": Yes, after cueing ("something to wear") Recall: "Blue": Yes, after cueing ("a color") Recall: "Bed": No, could not recall BIMS Summary Score: 11 Sensation Sensation Light Touch: Appears Intact Hot/Cold: Appears Intact Proprioception: Appears Intact Stereognosis: Appears Intact Coordination Gross Motor Movements are Fluid and Coordinated: Yes Fine Motor Movements are Fluid and Coordinated: Yes Motor  Motor Motor: Other (comment) Mobility  Bed Mobility Bed Mobility: Supine to Sit;Sit to Supine Supine to Sit: Independent Sit to Supine: Independent Transfers Sit to Stand: Independent with assistive device Stand to Sit: Independent with assistive device  Trunk/Postural Assessment  Cervical Assessment Cervical Assessment: Within Functional Limits Thoracic Assessment Thoracic Assessment: Within Functional Limits Lumbar Assessment Lumbar Assessment: Within Functional Limits Postural Control Postural Control: Within Functional Limits  Balance Balance Balance Assessed: Yes Static Sitting Balance Static Sitting - Balance Support: No upper extremity supported Static Sitting - Level of Assistance: 7: Independent Dynamic Sitting  Balance Dynamic Sitting - Balance Support: No upper extremity supported Dynamic Sitting - Level of Assistance: 6: Modified independent (Device/Increase time) Static Standing Balance Static Standing - Balance Support: No upper extremity supported Static Standing - Level of Assistance: 6: Modified independent (Device/Increase time) Dynamic Standing Balance Dynamic Standing - Balance Support: No upper extremity supported Dynamic Standing - Level of Assistance: 6: Modified independent (Device/Increase time) Extremity/Trunk Assessment RUE Assessment RUE Assessment: Within Functional Limits LUE Assessment LUE Assessment: Within Functional Limits   Curtis Sites 05/25/2022, 1:31  PM

## 2022-05-25 NOTE — Plan of Care (Signed)
  Problem: Sit to Stand Goal: LTG:  Patient will perform sit to stand in prep for activites of daily living with assistance level (OT) Description: LTG:  Patient will perform sit to stand in prep for activites of daily living with assistance level (OT) Outcome: Completed/Met

## 2022-05-25 NOTE — Progress Notes (Signed)
Physical Therapy Session Note  Patient Details  Name: Diamond Adams MRN: [id number] Date of Birth: 1960-12-08  Today's Date: 05/25/2022 PT Individual Time: 1000-1100 PT Individual Time Calculation (min): 60 min   Short Term Goals: Week 1:  PT Short Term Goal 1 (Week 1): STG = LTG due to ELOS  Skilled Therapeutic Interventions/Progress Updates:      Pt resting in recliner to start - agreeable to therapy. Reports 3/10 R hip pain, mobility provided for pain management. Pt requesting to use toilet prior to leaving her room. Pt completing 3/3 toileting tasks without assistance, continent of bladder. Pt also donning pajama pants with setupA only. Ambulatory transfer to sink where she completed hand hygiene with supervision assist. Transported in w/c to main rehab gym for time.  Sit<>stand to RW with distant supervision. Ambulated 248f with supervision and RW - gait slightly antalgic but improved since Friday. Cues for increasing step length and height, as well as gait speed. Gait training with no AD to challenge stability and progress LRAD. Ambulated same distance with CGA for safety, initially having step-to pattern but able to improve confidence with recpriocal stepping. Continue to recommend RW for safety while ambulating.   Stair training using HHA (pt has no hand rails). She navigated x12, 6inch steps, using HHA/minA. Step-to pattern, cues for safety and sequencing.  Furniture transfers and bed mobility using regular height bed. Completed both of these with distant supervision and minimal cues for safety.  UE ergometer seated in w/c to challenge stamina and UE strengthening - 5 minutes with L3.5 - cues for full ROM and maintaining quick cadence.  Returned to her room and pt remained seated in w/c with call bell in reach, all needs met.   Discussed with CSW pt's current level of mobility and nearly reaching goal level of mod I.   Therapy Documentation Precautions:   Precautions Precautions: Fall Restrictions Weight Bearing Restrictions: No General:    Therapy/Group: Individual Therapy  CAlger Simons7/01/2022, 7:32 AM

## 2022-05-25 NOTE — Consult Note (Signed)
Neuropsychological Consultation   Patient:   Diamond Adams   DOB:   Jan 14, 1961  MR Number:  [id number]  Location:  [place] [facility] [place] [id number] [place] [address] Dept: [place]: [phone]           Date of Service:   05/25/2022  Start Time:   8 AM End Time:   9 AM  Provider/Observer:  Ilean Skill, Psy.D.       Clinical Neuropsychologist       Billing Code/Service: [phone]  Chief Complaint:    Diamond Adams is a 61 year old female who presented on 05/17/2022 with acute onset of dizziness with a fall to the ground and complaining of numbness in all 4 extremities.  She had also had episodes of extremity weakness and paresthesia over the prior 2 weeks.  Patient underwent CTA which showed carotid dissection and vascular surgery consulted.  Patient transferred to [facility] with EEG suggesting dysfunction arising from right temporal region.  MRI brain showed scattered infarcts largest at the right frontoparietal region.  Patient experienced episodes of significant anxiety during hospitalization.  Once therapy evaluation completed patient admitted to comprehensive inpatient rehabilitation due to dysfunction secondary to right MCA territory scattered infarcts.  Reason for Service:  Patient referred for neuropsychological consultation due to anxiety and coping issues with extended hospital stay.  Below is the HPI for the current admission.  HPI: Diamond Adams is a 61 year old female who presented to the [facility] emergency department on 05/17/2022 after acute onset of dizziness a fall to the ground and complaining of numbness in all 4 extremities.  She also reported intermittent episodes of extremity weakness and paresthesias over the last 2 weeks.  Telemetry neurology consulted and the patient given aspirin. Work-up to rule in or out acute stroke.  She underwent CTA which showed carotid dissection and  vascular surgery consulted.  Neurology advised to initiate Plavix.  She was transferred to [facility].  She was outside the window for tenecteplase.  She underwent EEG which suggested cortical dysfunction arising from right temporal region.  No seizures or elliptic form discharges.  MRI of the brain revealed right MCA scattered infarcts largest at the right frontoparietal region.  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  On 6/28, she underwent carotid angiogram by Dr. Virl Cagey.  Greater then 90% stenosis appreciated at the ostium of the right ICA.  She will be maintained on aspirin 325 mg daily and Plavix 75 mg daily with plans to proceed with right TCAR on 7/6 with Dr. Virl Cagey.  Right groin stable status post angiogram.  Reporting anxiety at nighttime and given alprazolam 0.25 mg nightly.  She is tolerating a regular diet and will be maintained on Plavix, aspirin and statin. The patient requires inpatient physical medicine and rehabilitation evaluations and treatment secondary to dysfunction due to right MCA territory scattered infarcts.  Current Status:  Patient was awake and alert sitting in her recliner chair in the upright position.  Patient acknowledged awareness of cerebrovascular event and verbalized anxiety that has been improving.  Patient continues to be stressed about her history of smoking and the role it played in her cerebrovascular accident.  Patient reports that she has completely quit smoking and had quit for 3-year period of time in the past but went back to smoking.  Patient acknowledges improvement in her motor and sensory function and  is motivated to continue to work on therapeutic interventions.  Patient is walking with aid of rollator.  Specifically addressed issues related to depression and anxiety and she acknowledged past history of the symptoms and an acute exacerbation during her hospitalization with improvement over the past  several days.  Behavioral Observation: Diamond Adams  presents as a 61 y.o.-year-old Right Caucasian Female who appeared her stated age. her dress was Appropriate and she was Well Groomed and her manners were Appropriate to the situation.  her participation was indicative of Appropriate behaviors.  There were physical disabilities noted.  she displayed an appropriate level of cooperation and motivation.     Interactions:    Active Appropriate  Attention:   abnormal and attention span appeared shorter than expected for age  Memory:   within normal limits; recent and remote memory intact  Visuo-spatial:  not examined  Speech (Volume):  normal  Speech:   normal; normal  Thought Process:  Coherent and Relevant  Though Content:  WNL; not suicidal and not homicidal  Orientation:   person, place, time/date, and situation  Judgment:   Fair  Planning:   Fair  Affect:    Appropriate  Mood:    Anxious  Insight:   Fair  Intelligence:   normal  Medical History:   Past Medical History:  Diagnosis Date   Anxiety disorder    Depression    Fibromyalgia    Herpes    Hypertension    PONV (postoperative nausea and vomiting)          Patient Active Problem List   Diagnosis Date Noted   Anxiety state    Bruising 05/21/2022   Acute right MCA stroke ([place]) 05/21/2022   Essential hypertension 05/20/2022   Generalized weakness 05/18/2022   Carotid artery stenosis 05/18/2022   Hypokalemia 05/18/2022   Depression 05/18/2022   HLD (hyperlipidemia) 05/18/2022   Acute ischemic right MCA stroke ([place]) 05/18/2022   Tobacco use disorder 05/18/2022   Leukocytosis 05/18/2022     Psychiatric History:  Patient with past history of depression and anxiety with acute worsening recently.  Patient with prescriptions for both alprazolam and Prozac at her regular medications that are continued in hospital.  Family Med/Psych History:  Family History  Problem Relation Age of Onset   Heart disease  Father    Emphysema Mother    Cancer Sister        cervical    Impression/DX:  Diamond Adams is a 61 year old female who presented on 05/17/2022 with acute onset of dizziness with a fall to the ground and complaining of numbness in all 4 extremities.  She had also had episodes of extremity weakness and paresthesia over the prior 2 weeks.  Patient underwent CTA which showed carotid dissection and vascular surgery consulted.  Patient transferred to [facility] with EEG suggesting dysfunction arising from right temporal region.  MRI brain showed scattered infarcts largest at the right frontoparietal region.  Patient experienced episodes of significant anxiety during hospitalization.  Once therapy evaluation completed patient admitted to comprehensive inpatient rehabilitation due to dysfunction secondary to right MCA territory scattered infarcts.  Patient was awake and alert sitting in her recliner chair in the upright position.  Patient acknowledged awareness of cerebrovascular event and verbalized anxiety that has been improving.  Patient continues to be stressed about her history of smoking and the role it played in her cerebrovascular accident.  Patient reports that she has completely quit smoking and had quit for 3-year period  of time in the past but went back to smoking.  Patient acknowledges improvement in her motor and sensory function and is motivated to continue to work on therapeutic interventions.  Patient is walking with aid of rollator.  Specifically addressed issues related to depression and anxiety and she acknowledged past history of the symptoms and an acute exacerbation during her hospitalization with improvement over the past several days.  Disposition/Plan:  Today we worked on coping and adjustment issues and assessed current levels of depression and anxiety.  Patient denies that anxiety has been kept her from engaging in therapeutic efforts and we worked on strategies to manage her  cognitive interpretations of her symptoms that have been exacerbating anxiety and depression prior.  Diagnosis:    Frontoparietal CVA         Electronically Signed   _______________________ Ilean Skill, Psy.D. Clinical Neuropsychologist

## 2022-05-25 NOTE — Progress Notes (Signed)
Occupational Therapy Session Note  Patient Details  Name: Diamond Adams MRN: [id number] Date of Birth: 1961/08/07  Today's Date: 05/25/2022 OT Individual Time: 1305-1400 OT Individual Time Calculation (min): 55 min    Short Term Goals: Week 1:  OT Short Term Goal 1 (Week 1): STGs = LTGs  Skilled Therapeutic Interventions/Progress Updates:    Pt received supine in bed and agreeable to OT. CSW in the room discussing d/c tomorrow and discussing home equipment with pt- TTB and youth RW. Functional mobility to the restroom with RW and Mod I. Pt uses the restroom with use of grab bars while sitting and leaning to the left. After further discussion, discussed benefit of ordering a BSC for increased safety and balance while using the restroom. Pt donned a new pad d/t some residual blood from shot. Practice transfer with TTB in ADL room. Able to get in and out with Mod I. Educated on having daughter present the first couple of times for safety. Therapist demonstrated and pt return demonstrated how to get up form a fall. With increased time, pt able to sit on the floor from EOM and pull herself up to EOM. Transported back to room d/t fatigue. Pt reporting pulling pain in abdomen that is worse than normal. Emotional support and ice pack offered by pt declined use. Pt voices concern about returning home d/t fears of being alone during the day. Educated on 4 Ps of energy conservation- plan, pace, prioritize, position when home. Pt left in bed with call bell in reach, and all needs met. Room status updated to Mod I within the room.  Therapy Documentation Precautions:  Precautions Precautions: None Restrictions Weight Bearing Restrictions: No  Therapy/Group: Individual Therapy  Catalina Lunger 05/25/2022, 3:03 PM

## 2022-05-25 NOTE — Progress Notes (Signed)
PROGRESS NOTE   Subjective/Complaints:   Pt reports she had some panic attacks over the weekend but is feeling better now.  Possible discharge tomorrow.    ROS:   Pt denies SOB, abd pain, CP, HA, N/V/C/D, and vision changes   Objective:   No results found. Recent Labs    05/25/22 0616  WBC 10.2  HGB 11.9*  HCT 35.5*  PLT 262    Recent Labs    05/25/22 0616  NA 139  K 3.3*  CL 105  CO2 26  GLUCOSE 126*  BUN 10  CREATININE 0.69  CALCIUM 8.9     Intake/Output Summary (Last 24 hours) at 05/25/2022 0856 Last data filed at 05/25/2022 0839 Gross per 24 hour  Intake 792 ml  Output --  Net 792 ml         Physical Exam: Vital Signs Blood pressure (!) 145/56, pulse (!) 51, temperature 97.6 F (36.4 C), temperature source Oral, resp. rate 17, height '4\' 10"'$  (1.473 m), weight 64.3 kg, SpO2 94 %.     General: awake, alert, appropriate, NAD HENT: conjugate gaze; oropharynx moist CV: regular rate; no JVD Pulmonary: CTA B/L; no W/R/R- normal rate GI: soft, NT, ND, (+)BS Psychiatric: appropriate Neurological: Ox3- slightly delayed responses  Musculoskeletal:     Cervical back: Neck supple. No tenderness.     Comments: Right groin puncture site with non-expanding hematoma. Soft to palpation. + ecchymosis medially In groin, upper thigh and lower RLQ- all purple bruising- looks the same/no change RUE 5/5 LUE 5-/5 throughout RLE_ 5-/5 proximally and 5/5 distally- due to pain LLE- 5-/5 throughout  Skin:    General: Skin is warm and dry.  No breakdown noted    Comments: Right AC PIV    Right groin incision with significant bruising- around R upper thigh and R lower abdomen- but purple bruising less dark Neurological:     Mental Status: She is alert.     Comments: Intact to light touch in face and in all 4 limbs  Psychiatric:        Mood and Affect: Mood normal.        Behavior: Behavior normal.       Assessment/Plan: 1. Functional deficits which require 3+ hours per day of interdisciplinary therapy in a comprehensive inpatient rehab setting. Physiatrist is providing close team supervision and 24 hour management of active medical problems listed below. Physiatrist and rehab team continue to assess barriers to discharge/monitor patient progress toward functional and medical goals  Care Tool:  Bathing    Body parts bathed by patient: Right arm, Left arm, Chest, Abdomen, Front perineal area, Buttocks, Right upper leg, Left upper leg, Left lower leg, Face, Right lower leg   Body parts bathed by helper: Right lower leg     Bathing assist Assist Level: Contact Guard/Touching assist     Upper Body Dressing/Undressing Upper body dressing   What is the patient wearing?: Dress    Upper body assist Assist Level: Supervision/Verbal cueing    Lower Body Dressing/Undressing Lower body dressing      What is the patient wearing?: Underwear/pull up, Pants     Lower body assist Assist for  lower body dressing: Contact Guard/Touching assist     Toileting Toileting    Toileting assist Assist for toileting: Supervision/Verbal cueing     Transfers Chair/bed transfer  Transfers assist     Chair/bed transfer assist level: Contact Guard/Touching assist     Locomotion Ambulation   Ambulation assist      Assist level: Contact Guard/Touching assist Assistive device: Walker-rolling Max distance: 157f   Walk 10 feet activity   Assist     Assist level: Contact Guard/Touching assist Assistive device: Walker-rolling   Walk 50 feet activity   Assist    Assist level: Contact Guard/Touching assist Assistive device: Walker-rolling    Walk 150 feet activity   Assist Walk 150 feet activity did not occur: Safety/medical concerns (fatigue)         Walk 10 feet on uneven surface  activity   Assist Walk 10 feet on uneven surfaces activity did not occur:  Safety/medical concerns         Wheelchair     Assist Is the patient using a wheelchair?: No             Wheelchair 50 feet with 2 turns activity    Assist            Wheelchair 150 feet activity     Assist          Blood pressure (!) 145/56, pulse (!) 51, temperature 97.6 F (36.4 C), temperature source Oral, resp. rate 17, height '4\' 10"'$  (1.473 m), weight 64.3 kg, SpO2 94 %.    Medical Problem List and Plan: 1. Functional deficits secondary to right scattered MCA territory infarcts             -patient may  shower             -ELOS/Goals: 7-10 days- mod I  Grounds pass ordered  Con't CIR- PT, OT and SLP  -possible discharge tomorrow 2.  Antithrombotics: -DVT/anticoagulation:  Pharmaceutical: Heparin             -antiplatelet therapy: Plavix, aspirin 3. Right groin pain: Added Tylenol #3 for moderate pain. Tylenol, oxycodone as needed  7/2- pain controlled with meds- con't regimen 4. Mood/Sleep: LCSW to evaluate and provide emotional support             -antipsychotic agents: n/a 5. Neuropsych/cognition: This patient is capable of making decisions on her own behalf. 6. Skin/Wound Care: Routine skin care checks 7. Fluids/Electrolytes/Nutrition: I's and O's and follow-up chemistries 8: Right carotid artery stenosis: Planning right TCAR 7/6 9: History of depression: started on Prozac  10: Anxiety disorder: Continue alprazolam nightly 7/1- pt anxious that Sx's are the cause of her stroke- explained if had 1 month, I'm not convinced they are related.  7/2- feeling much better today- Sx's resolved for now 7/3 Pt feeling better, seen by Dr. RSima Matastoday, appreciate 11: Hyperlipidemia: Continue Crestor (at home on Pravachol 40 mg daily) 12: Essential hypertension: holding home Zestoretic 20/25 q AS and qHS as well as Toprol XL 12.5 mg daily . Add magnesium gluconate '250mg'$  HS  7/1- BP high normal/con't to monitor trend  7/2- BP well controlled- con't  reigmen 13: Tobacco use disorder: Advised regarding cessation.  She does report cravings at this time. 14: GERD: Continue Protonix 15. Screening for vitamin D deficiency: added on to today's labs, f/u tomorrow  7/1- Vit D Level 35.76- con't to monitor  16. Hypokalemia K+ 3.3  -KCL 217m x2 doses, recheck tomorrow am 17. Anemia  mild  -Recheck tomorrow   LOS: 4 days A FACE TO FACE EVALUATION WAS PERFORMED  Jennye Boroughs 05/25/2022, 8:56 AM

## 2022-05-25 NOTE — Progress Notes (Addendum)
Patient ID: Diamond Adams, female   DOB: 09-15-1961, 61 y.o.   MRN: [id number] Met with the patient to review current situation, rehab process, team conference and plan of care. Discussed pending OR (TACR) on 05/28/22. Reviewed secondary risks including HLD (LDL 83/Trig 75) and HTN with smoking risks. Reviewed dietary modification recommendations and medications; ASA + Plavix daily per MD. Continue to follow along to discharge to address educational needs to facilitate preparation for discharge home. Margarito Liner

## 2022-05-26 LAB — CBC
HCT: 36.6 % (ref 36.0–46.0)
Hemoglobin: 12.9 g/dL (ref 12.0–15.0)
MCH: 30.5 pg (ref 26.0–34.0)
MCHC: 35.2 g/dL (ref 30.0–36.0)
MCV: 86.5 fL (ref 80.0–100.0)
Platelets: 261 10*3/uL (ref 150–400)
RBC: 4.23 MIL/uL (ref 3.87–5.11)
RDW: 13.3 % (ref 11.5–15.5)
WBC: 9.7 10*3/uL (ref 4.0–10.5)
nRBC: 0 % (ref 0.0–0.2)

## 2022-05-26 LAB — BASIC METABOLIC PANEL
Anion gap: 10 (ref 5–15)
BUN: 12 mg/dL (ref 6–20)
CO2: 27 mmol/L (ref 22–32)
Calcium: 9.2 mg/dL (ref 8.9–10.3)
Chloride: 106 mmol/L (ref 98–111)
Creatinine, Ser: 0.68 mg/dL (ref 0.44–1.00)
GFR, Estimated: >60 mL/min (ref >60)
Glucose, Bld: 93 mg/dL (ref 70–99)
Potassium: 4.7 mmol/L (ref 3.5–5.1)
Sodium: 143 mmol/L (ref 135–145)

## 2022-05-26 MED ORDER — PANTOPRAZOLE SODIUM 40 MG PO TBEC: 40.0000 mg | DELAYED_RELEASE_TABLET | Freq: Every day | ORAL | 0 refills | Status: DC | PRN

## 2022-05-26 MED ORDER — ROSUVASTATIN CALCIUM 20 MG PO TABS
20.0000 mg | ORAL_TABLET | Freq: Every day | ORAL | 0 refills | Status: DC
Start: 2022-05-26 — End: 2022-11-09

## 2022-05-26 MED ORDER — FLUOXETINE HCL 10 MG PO CAPS: 10.0000 mg | ORAL_CAPSULE | Freq: Every day | ORAL | 0 refills | Status: DC

## 2022-05-26 MED ORDER — CLOPIDOGREL BISULFATE 75 MG PO TABS
75.0000 mg | ORAL_TABLET | Freq: Every day | ORAL | 0 refills | Status: AC
Start: 2022-05-26 — End: 2022-07-25

## 2022-05-26 MED ORDER — ALPRAZOLAM 0.25 MG PO TABS
0.2500 mg | ORAL_TABLET | Freq: Every evening | ORAL | 0 refills | Status: AC | PRN
Start: 2022-05-26 — End: ?

## 2022-05-26 MED ORDER — ACETAMINOPHEN 325 MG PO TABS
325.0000 mg | ORAL_TABLET | ORAL | Status: DC | PRN
Start: 2022-05-26 — End: 2022-09-08

## 2022-05-26 MED ORDER — MAGNESIUM GLUCONATE 500 MG PO TABS: 250.0000 mg | ORAL_TABLET | Freq: Every day | ORAL | 0 refills | Status: DC

## 2022-05-26 MED ORDER — ASPIRIN 325 MG PO TBEC: 325.0000 mg | DELAYED_RELEASE_TABLET | Freq: Every day | ORAL | 0 refills | Status: AC

## 2022-05-26 NOTE — Progress Notes (Signed)
Inpatient Rehabilitation Discharge Medication Review by a Pharmacist  A complete drug regimen review was completed for this patient to identify any potential clinically significant medication issues.  High Risk Drug Classes Is patient taking? Indication by Medication  Antipsychotic No   Anticoagulant No   Antibiotic No   Opioid No   Antiplatelet Yes ASA/Plavix - CVA  Hypoglycemics/insulin No   Vasoactive Medication No   Chemotherapy No   Other Yes Pantoprazole - GERD Crestor - HLD Fluoxetine, alprazolam - anxiety, mood Meclizine prn for dizziness     Type of Medication Issue Identified Description of Issue Recommendation(s)  Drug Interaction(s) (clinically significant)     Duplicate Therapy     Allergy     No Medication Administration End Date     Incorrect Dose     Additional Drug Therapy Needed     Significant med changes from prior encounter (inform family/care partners about these prior to discharge). Metoprolol / lisinopril/HCTZ held Resume if appropriate at discharge  Other       Clinically significant medication issues were identified that warrant physician communication and completion of prescribed/recommended actions by midnight of the next day:  No   Pharmacist comments: None  Time spent performing this drug regimen review (minutes):  30 minutes   Renold Genta, PharmD, BCPS Clinical Pharmacist Clinical phone for 05/26/2022 until 3p is [phone] 05/26/2022 7:36 AM

## 2022-05-26 NOTE — Progress Notes (Addendum)
Inpatient Rehabilitation Care Coordinator Discharge Note   Patient Details  Name: Diamond Adams MRN: [id number] Date of Birth: 22-Aug-1961   Discharge location: HOME WITH DAUGHTER WHO CAN PROVIDE EVENING ASSIST UE TO WORKS DURING THE DAY  Length of Stay: 5 DAYS  Discharge activity level: MOD/I LEVEL  Home/community participation: ACTIVE  Patient response ML:JQGBEE Literacy - How often do you need to have someone help you when you read instructions, pamphlets, or other written material from your doctor or pharmacy?: Sometimes  Patient response FE:OFHQRF Isolation - How often do you feel lonely or isolated from those around you?: Never  Services provided included: MD, RD, PT, OT, RN, CM, TR, Pharmacy, SW  Financial Services:  Charity fundraiser Utilized: Private Insurance [employer]  Choices offered to/list presented to: PT  Follow-up services arranged:  DME, Patient/Family has no preference for HH/DME agencies   [place]   DME : [facility] AND 3 IN 1    Patient response to transportation need: Is the patient able to respond to transportation needs?: Yes In the past 12 months, has lack of transportation kept you from medical appointments or from getting medications?: Yes In the past 12 months, has lack of transportation kept you from meetings, work, or from getting things needed for daily living?: Yes    Comments (or additional information):PT REACHED HER GOALS SOONER THAN SURGERY DATE OF 7/6. FEELS [place]  Patient/Family verbalized understanding of follow-up arrangements:  Yes  Individual responsible for coordination of the follow-up plan: SELF [phone]  Confirmed correct DME delivered: Elease Hashimoto 05/26/2022    Raydell Maners, Gardiner Rhyme

## 2022-05-26 NOTE — Progress Notes (Signed)
PROGRESS NOTE   Subjective/Complaints:   Pt scheduled to go home today.     ROS:   Pt denies SOB, cough, abd pain, CP, HA, N/V/C/D, and vision changes   Objective:   No results found. Recent Labs    05/25/22 0616 05/26/22 0541  WBC 10.2 9.7  HGB 11.9* 12.9  HCT 35.5* 36.6  PLT 262 261    Recent Labs    05/25/22 0616 05/26/22 0541  NA 139 143  K 3.3* 4.7  CL 105 106  CO2 26 27  GLUCOSE 126* 93  BUN 10 12  CREATININE 0.69 0.68  CALCIUM 8.9 9.2     Intake/Output Summary (Last 24 hours) at 05/26/2022 0806 Last data filed at 05/25/2022 1757 Gross per 24 hour  Intake 800 ml  Output --  Net 800 ml         Physical Exam: Vital Signs Blood pressure (!) 148/60, pulse (!) 48, temperature 97.8 F (36.6 C), resp. rate 17, height '4\' 10"'$  (1.473 m), weight 64.3 kg, SpO2 98 %.     General: awake, alert, appropriate, NAD HENT: conjugate gaze; oropharynx moist CV: regular rate; no JVD Pulmonary: CTA B/L; no W/R/R- good air movement GI: soft, NT, ND, (+)BS Psychiatric: appropriate, pleasant Neurological: Ox3- slightly delayed responses  Musculoskeletal:     Cervical back: Neck supple. No tenderness.     Comments: Right groin puncture site with non-expanding hematoma. Soft to palpation. + ecchymosis medially In groin, upper thigh and lower RLQ- all purple bruising- looks the same/no change RUE 5/5 LUE 5-/5 throughout RLE_ 5-/5 proximally and 5/5 distally- due to pain LLE- 5-/5 throughout  Skin:    General: Skin is warm and dry.  No breakdown noted  Right groin incision with significant bruising- around R upper thigh and R lower abdomen- but purple bruising less dark Neurological:     Mental Status: She is alert.     Comments: Intact to light touch in face and in all 4 limbs  Psychiatric:        Mood and Affect: Mood normal.        Behavior: Behavior normal.      Assessment/Plan: 1. Functional  deficits which require 3+ hours per day of interdisciplinary therapy in a comprehensive inpatient rehab setting. Physiatrist is providing close team supervision and 24 hour management of active medical problems listed below. Physiatrist and rehab team continue to assess barriers to discharge/monitor patient progress toward functional and medical goals  Care Tool:  Bathing    Body parts bathed by patient: Right arm, Left arm, Chest, Abdomen, Front perineal area, Buttocks, Right upper leg, Left upper leg, Left lower leg, Face, Right lower leg   Body parts bathed by helper: Right lower leg     Bathing assist Assist Level: Independent with assistive device     Upper Body Dressing/Undressing Upper body dressing   What is the patient wearing?: Dress    Upper body assist Assist Level: Independent with assistive device    Lower Body Dressing/Undressing Lower body dressing      What is the patient wearing?: Underwear/pull up, Pants     Lower body assist Assist for lower body  dressing: Independent with assitive device     Toileting Toileting    Toileting assist Assist for toileting: Independent with assistive device     Transfers Chair/bed transfer  Transfers assist     Chair/bed transfer assist level: Independent with assistive device Chair/bed transfer assistive device: Programmer, multimedia   Ambulation assist      Assist level: Independent with assistive device Assistive device: Walker-rolling Max distance: 111f   Walk 10 feet activity   Assist     Assist level: Independent with assistive device Assistive device: Walker-rolling   Walk 50 feet activity   Assist    Assist level: Independent with assistive device Assistive device: Walker-rolling    Walk 150 feet activity   Assist Walk 150 feet activity did not occur: Safety/medical concerns (fatigue)  Assist level: Independent with assistive device Assistive device: Walker-rolling     Walk 10 feet on uneven surface  activity   Assist Walk 10 feet on uneven surfaces activity did not occur: Safety/medical concerns   Assist level: Independent with assistive device Assistive device: Walker-rolling   Wheelchair     Assist Is the patient using a wheelchair?: No             Wheelchair 50 feet with 2 turns activity    Assist            Wheelchair 150 feet activity     Assist          Blood pressure (!) 148/60, pulse (!) 48, temperature 97.8 F (36.6 C), resp. rate 17, height '4\' 10"'$  (1.473 m), weight 64.3 kg, SpO2 98 %.    Medical Problem List and Plan: 1. Functional deficits secondary to right scattered MCA territory infarcts             -patient may  shower             -ELOS/Goals: 7-10 days- mod I  Grounds pass ordered  Con't CIR- PT, OT and SLP  -Discharge today 2.  Antithrombotics: -DVT/anticoagulation:  Pharmaceutical: Heparin             -antiplatelet therapy: Plavix, aspirin 3. Right groin pain: Added Tylenol #3 for moderate pain. Tylenol, oxycodone as needed  7/2- pain controlled with meds- con't regimen 4. Mood/Sleep: LCSW to evaluate and provide emotional support             -antipsychotic agents: n/a 5. Neuropsych/cognition: This patient is capable of making decisions on her own behalf. 6. Skin/Wound Care: Routine skin care checks 7. Fluids/Electrolytes/Nutrition: I's and O's and follow-up chemistries 8: Right carotid artery stenosis: Planning right TCAR 7/6 9: History of depression: started on Prozac  10: Anxiety disorder: Continue alprazolam nightly 7/1- pt anxious that Sx's are the cause of her stroke- explained if had 1 month, I'm not convinced they are related.  7/2- feeling much better today- Sx's resolved for now 7/3 Pt feeling better, seen by Dr. RSima Matastoday, appreciate 11: Hyperlipidemia: Continue Crestor (at home on Pravachol 40 mg daily) 12: Essential hypertension: holding home Zestoretic 20/25 q AS  and qHS as well as Toprol XL 12.5 mg daily . Add magnesium gluconate '250mg'$  HS  7/1- BP high normal/con't to monitor trend  7/4 few mildly elevated Bps overall stable, f/u with PCP 13: Tobacco use disorder: Advised regarding cessation.  She does report cravings at this time. 14: GERD: Continue Protonix 15. Screening for vitamin D deficiency: added on to today's labs, f/u tomorrow  7/1- Vit D Level 35.76-  con't to monitor  16. Hypokalemia K+ 3.3  -KCL 49mq given, K now 4.7 on 4/7 17. Anemia mild  -improved to 12.9 this 7/4   LOS: 5 days A FACE TO FACE EVALUATION WAS PERFORMED  YJennye Boroughs7/02/2022, 8:06 AM

## 2022-05-27 ENCOUNTER — Encounter (HOSPITAL_COMMUNITY): Payer: Self-pay | Admitting: Vascular Surgery

## 2022-05-27 ENCOUNTER — Other Ambulatory Visit: Payer: Self-pay

## 2022-05-27 DIAGNOSIS — I6521 Occlusion and stenosis of right carotid artery: Secondary | ICD-10-CM

## 2022-05-27 NOTE — Progress Notes (Addendum)
Anesthesia Chart Review: Diamond Adams  Case: [id number] Date/Time: 05/28/22 0715   Procedure: Transcarotid Artery Revascularization (Right)   Anesthesia type: General   Pre-op diagnosis: Right carotid artery stenosis   Location: [facility] OR ROOM 16 / [place] [facility]   Surgeons: Broadus John, MD       DISCUSSION: Patient is a 61 year old female scheduled for the above procedure.  History includes former smoker (quit 05/17/22), postoperative nausea/vomiting, HTN, HLD, CVA (05/17/22), carotid artery stenosis, GERD, anxiety, fibromyalgia.  [facility] admission 05/17/22-05/21/22 for CVA. She was brought to [facility] by EMS following fall related to dizziness and numbness in all 4 extremities. Also had blurred vision. Reported intermittent weakness for the previous 2 weeks. Head CT showed acute to subactue right posterior parietal lobe evolving infarct. Teleneurology consulted with CVA and metabolic work-up recommended. CTA showed severe right ICA narrowin gwith occlusion proximal to the skull base, with carotid dissection suspected, so decision to transfer to [facility] for vascular surgery evaluation. Brain MRI showed acute and subacute infarcts in the right MCA distribution. 80-99% RICA stensis, 36-62% LICA stenosis, and no obvious carotid dissection on [place]. Carotid angiogram showed > 90% right ICA stenosis at the ostia. Started on ASA and Plavix. Right TCAR recommended. Neurology aware. Discharged to [place] for rehab 05/21/22-05/26/22.   She is to continue Plavix and ASA for procedure. Last CBC and BMET 05/26/22, last CMP 05/22/22. Also with orders for UA, T&S, PT/PTT per vascular. Anesthesia team to evaluate on the day of surgery.    VS: Ht '4\' 10"'$  (1.473 m)   Wt 64.3 kg   BMI 29.63 kg/m  BP Readings from Last 3 Encounters:  05/26/22 (!) 148/60  05/21/22 (!) 125/49  04/25/22 (!) 156/60   Pulse Readings from Last 3 Encounters:  05/26/22 (!) 48  05/21/22 (!) 57  04/25/22 (!) 56     PROVIDERS: Glenda Chroman, MD is PCP   Leeroy Cha, MD is PM&R Rosalin Hawking, MD is neurologist (inpatient)   LABS: Most recent lab results include: Lab Results  Component Value Date   WBC 9.7 05/26/2022   HGB 12.9 05/26/2022   HCT 36.6 05/26/2022   PLT 261 05/26/2022   GLUCOSE 93 05/26/2022   CHOL 138 05/18/2022   TRIG 75 05/18/2022   HDL 40 (L) 05/18/2022   LDLCALC 83 05/18/2022   ALT 17 05/22/2022   AST 17 05/22/2022   NA 143 05/26/2022   K 4.7 05/26/2022   CL 106 05/26/2022   CREATININE 0.68 05/26/2022   BUN 12 05/26/2022   CO2 27 05/26/2022   TSH 1.362 05/18/2022   HGBA1C 5.4 05/18/2022    IMAGES: MRI Brain 05/18/22: IMPRESSION: 1. 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 2. Known poor or absent flow in the right ICA from prior CTA.  CTA head/neck 05/18/22: IMPRESSION: 1. 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 2. Reconstitution of the right ICA at the distal petrous segment, likely due to collateral flow across the circle-of-Willis. 3. No intracranial arterial occlusion or hemodynamically significant stenosis.   1V PCXR 05/18/22: FINDINGS: The heart size and mediastinal contours are within normal limits. Both lungs are clear. The visualized skeletal structures are unremarkable. IMPRESSION: No active disease.   EKG: 05/19/22: Normal sinus rhythm Nonspecific ST abnormality Abnormal ECG When compared with ECG of 17-May-2022 23:34, PREVIOUS ECG IS PRESENT No  significant change since last tracing Confirmed by Daneen Schick [phone]) on 05/19/2022 6:23:44 PM   CV: Aortic arch/carotid angiography 05/20/22: Findings:  Aortic arch angiogram: Type III aortic arch.  No flow-limiting stenosis appreciated in first-order arch branches.  Widely patent bilateral subclavian arteries, bilateral vertebral arteries. Greater  than 90% stenosis appreciated at the ostia of the right internal carotid artery.  The internal carotid arteries patent throughout.  The anterior cerebral artery is not appreciated.  Middle cerebral artery fills.  Impression:  Severe 90% ostial stenosis of the right internal carotid artery.  The right ICA is patent, filling the right middle cerebral artery.  The right anterior communicating artery was not appreciated.   Echo 05/19/22: IMPRESSIONS   1. Left ventricular ejection fraction, by estimation, is 60 to 65%. The  left ventricle has normal function. The left ventricle has no regional  wall motion abnormalities. Left ventricular diastolic parameters were  normal.   2. Right ventricular systolic function is normal. The right ventricular  size is normal.   3. The mitral valve is normal in structure. No evidence of mitral valve  regurgitation. No evidence of mitral stenosis.   4. The aortic valve is normal in structure. Aortic valve regurgitation is  not visualized. No aortic stenosis is present.   5. The inferior vena cava is normal in size with greater than 50%  respiratory variability, suggesting right atrial pressure of 3 mmHg.    US Carotid 05/18/22: Summary:  - Right Carotid: Velocities in the right ICA are consistent with a 80-99% stenosis by peak systolic velocities and plaque morphology. No obvious evidence of dissection.  - Left Carotid: Velocities in the left ICA are consistent with a 60-79% stenosis, however this may be compensatory flow.  - Vertebrals:  Bilateral vertebral arteries demonstrate antegrade flow.  - Subclavians: Normal flow hemodynamics were seen in bilateral subclavian arteries.    Past Medical History:  Diagnosis Date   Anxiety disorder    Depression    Fibromyalgia    GERD (gastroesophageal reflux disease)    Herpes    Hyperlipidemia    Hypertension    PONV (postoperative nausea and vomiting)    Stroke ([place]) 05/20/2022    Past Surgical History:   Procedure Laterality Date   ABDOMINAL HYSTERECTOMY  1994   AORTIC ARCH ANGIOGRAPHY N/A 05/20/2022   Procedure: AORTIC ARCH ANGIOGRAPHY;  Surgeon: Broadus John, MD;  Location: [facility];  Service: Cardiovascular;  Laterality: N/A;   CAROTID ANGIOGRAPHY N/A 05/20/2022   Procedure: CAROTID ANGIOGRAPHY;  Surgeon: Broadus John, MD;  Location: [facility];  Service: Cardiovascular;  Laterality: N/A;   CARPAL TUNNEL RELEASE  2013,2011   right and left   EXCISION NASAL MASS Left 09/25/2013   Procedure: EXCISION NASAL CANCER WITH SPLIT THICKNESS SKIN GRAFT FROM RIGHT THIGH;  Surgeon: Ascencion Dike, MD;  Location: [facility];  Service: ENT;  Laterality: Left;   RIGHT OOPHORECTOMY     THUMB ARTHROSCOPY     right   TONSILLECTOMY     TUBAL LIGATION      MEDICATIONS: No current facility-administered medications for this encounter.    acetaminophen (TYLENOL) 325 MG tablet   ALPRAZolam (XANAX) 0.25 MG tablet   aspirin EC 325 MG tablet   clopidogrel (PLAVIX) 75 MG tablet   FLUoxetine (PROZAC) 10 MG capsule   magnesium gluconate (MAGONATE) 500 MG tablet   meclizine (ANTIVERT) 25 MG tablet   pantoprazole (PROTONIX) 40 MG tablet  rosuvastatin (CRESTOR) 20 MG tablet    Myra Gianotti, PA-C Surgical Short Stay/Anesthesiology [facility] Phone [phone] [facility] Phone [phone] 05/27/2022 2:56 PM

## 2022-05-27 NOTE — Progress Notes (Signed)
SDW Call Complete   PCP - [facility] Cardiologist - denies  PPM/ICD - n/a  Chest x-ray - 05/18/22 EKG - 05/18/22 Stress Test - denies ECHO - 05/19/22 Cardiac Cath - denies  Sleep Study - denies CPAP - denies  Blood Thinner Instructions: Plavix, continued as ordered. Aspirin Instructions: Continue as ordered.   NPO at MD.  COVID TEST- n/a  Anesthesia review: Yes, recent hospitalization for stroke.   Patient denies shortness of breath, fever, cough and chest pain.   All instructions explained to the patient, with a verbal understanding of the material. The opportunity to ask questions was provided.

## 2022-05-27 NOTE — Anesthesia Preprocedure Evaluation (Signed)
Anesthesia Evaluation  Patient identified by MRN, date of birth, ID band Patient awake    Reviewed: Allergy & Precautions, NPO status , Patient's Chart, lab work & pertinent test results  Airway Mallampati: II  TM Distance: >3 FB Neck ROM: Full    Dental   Pulmonary former smoker,    breath sounds clear to auscultation       Cardiovascular hypertension, Pt. on medications  Rhythm:Regular Rate:Normal   Echo 05/19/22: IMPRESSIONS  1. Left ventricular ejection fraction, by estimation, is 60 to 65%. The  left ventricle has normal function. The left ventricle has no regional  wall motion abnormalities. Left ventricular diastolic parameters were  normal.  2. Right ventricular systolic function is normal. The right ventricular  size is normal.  3. The mitral valve is normal in structure. No evidence of mitral valve  regurgitation. No evidence of mitral stenosis.  4. The aortic valve is normal in structure. Aortic valve regurgitation is  not visualized. No aortic stenosis is present.  5. The inferior vena cava is normal in size with greater than 50%  respiratory variability, suggesting right atrial pressure of 3 mmHg.      Neuro/Psych CVA    GI/Hepatic Neg liver ROS, GERD  ,  Endo/Other  negative endocrine ROS  Renal/GU negative Renal ROS     Musculoskeletal  (+) Fibromyalgia -  Abdominal   Peds  Hematology negative hematology ROS (+)   Anesthesia Other Findings   Reproductive/Obstetrics                            Lab Results  Component Value Date   WBC 9.7 05/26/2022   HGB 12.9 05/26/2022   HCT 36.6 05/26/2022   MCV 86.5 05/26/2022   PLT 261 05/26/2022   Lab Results  Component Value Date   CREATININE 0.68 05/26/2022   BUN 12 05/26/2022   NA 143 05/26/2022   K 4.7 05/26/2022   CL 106 05/26/2022   CO2 27 05/26/2022    Anesthesia Physical Anesthesia Plan  ASA:  3  Anesthesia Plan: General   Post-op Pain Management: Tylenol PO (pre-op)* and Minimal or no pain anticipated   Induction: Intravenous  PONV Risk Score and Plan: 3 and Dexamethasone, Ondansetron and Treatment may vary due to age or medical condition  Airway Management Planned: Oral ETT  Additional Equipment: Arterial line  Intra-op Plan:   Post-operative Plan: Extubation in OR  Informed Consent: I have reviewed the patients History and Physical, chart, labs and discussed the procedure including the risks, benefits and alternatives for the proposed anesthesia with the patient or authorized representative who has indicated his/her understanding and acceptance.     Dental advisory given  Plan Discussed with: CRNA  Anesthesia Plan Comments: ( )       Anesthesia Quick Evaluation

## 2022-05-28 ENCOUNTER — Encounter (HOSPITAL_COMMUNITY): Payer: Self-pay | Admitting: Vascular Surgery

## 2022-05-28 ENCOUNTER — Inpatient Hospital Stay (HOSPITAL_COMMUNITY): Payer: Medicare Other

## 2022-05-28 ENCOUNTER — Encounter (HOSPITAL_COMMUNITY)
Admission: RE | Disposition: A | Discharge status: Home / Self Care | Payer: Self-pay | Source: Home / Self Care | Attending: Vascular Surgery

## 2022-05-28 ENCOUNTER — Other Ambulatory Visit: Payer: Self-pay

## 2022-05-28 ENCOUNTER — Inpatient Hospital Stay (HOSPITAL_COMMUNITY): Payer: Medicare Other | Admitting: Physician Assistant

## 2022-05-28 ENCOUNTER — Inpatient Hospital Stay (HOSPITAL_COMMUNITY)
Admission: RE | Admit: 2022-05-28 | Discharge: 2022-05-29 | DRG: 035 | Disposition: A | Discharge status: Home / Self Care | Payer: Medicare Other | Attending: Vascular Surgery | Admitting: Vascular Surgery

## 2022-05-28 DIAGNOSIS — I724 Aneurysm of artery of lower extremity: Secondary | ICD-10-CM

## 2022-05-28 DIAGNOSIS — Z8249 Family history of ischemic heart disease and other diseases of the circulatory system: Secondary | ICD-10-CM | POA: Diagnosis not present

## 2022-05-28 DIAGNOSIS — E785 Hyperlipidemia, unspecified: Secondary | ICD-10-CM | POA: Diagnosis not present

## 2022-05-28 DIAGNOSIS — Z8673 Personal history of transient ischemic attack (TIA), and cerebral infarction without residual deficits: Secondary | ICD-10-CM

## 2022-05-28 DIAGNOSIS — F419 Anxiety disorder, unspecified: Secondary | ICD-10-CM | POA: Diagnosis present

## 2022-05-28 DIAGNOSIS — Z885 Allergy status to narcotic agent status: Secondary | ICD-10-CM | POA: Diagnosis not present

## 2022-05-28 DIAGNOSIS — K219 Gastro-esophageal reflux disease without esophagitis: Secondary | ICD-10-CM | POA: Diagnosis not present

## 2022-05-28 DIAGNOSIS — Z87891 Personal history of nicotine dependence: Secondary | ICD-10-CM | POA: Diagnosis not present

## 2022-05-28 DIAGNOSIS — I10 Essential (primary) hypertension: Secondary | ICD-10-CM

## 2022-05-28 DIAGNOSIS — L7632 Postprocedural hematoma of skin and subcutaneous tissue following other procedure: Secondary | ICD-10-CM | POA: Diagnosis not present

## 2022-05-28 DIAGNOSIS — M797 Fibromyalgia: Secondary | ICD-10-CM

## 2022-05-28 DIAGNOSIS — Z9851 Tubal ligation status: Secondary | ICD-10-CM

## 2022-05-28 DIAGNOSIS — I6521 Occlusion and stenosis of right carotid artery: Secondary | ICD-10-CM | POA: Diagnosis not present

## 2022-05-28 DIAGNOSIS — Z7982 Long term (current) use of aspirin: Secondary | ICD-10-CM

## 2022-05-28 DIAGNOSIS — I6529 Occlusion and stenosis of unspecified carotid artery: Secondary | ICD-10-CM | POA: Diagnosis present

## 2022-05-28 DIAGNOSIS — I959 Hypotension, unspecified: Secondary | ICD-10-CM | POA: Diagnosis not present

## 2022-05-28 DIAGNOSIS — F32A Depression, unspecified: Secondary | ICD-10-CM | POA: Diagnosis present

## 2022-05-28 DIAGNOSIS — Z7902 Long term (current) use of antithrombotics/antiplatelets: Secondary | ICD-10-CM

## 2022-05-28 DIAGNOSIS — I63231 Cerebral infarction due to unspecified occlusion or stenosis of right carotid arteries: Secondary | ICD-10-CM | POA: Diagnosis not present

## 2022-05-28 DIAGNOSIS — Z90721 Acquired absence of ovaries, unilateral: Secondary | ICD-10-CM

## 2022-05-28 DIAGNOSIS — Z90710 Acquired absence of both cervix and uterus: Secondary | ICD-10-CM | POA: Diagnosis not present

## 2022-05-28 DIAGNOSIS — Z79899 Other long term (current) drug therapy: Secondary | ICD-10-CM | POA: Diagnosis not present

## 2022-05-28 HISTORY — PX: TRANSCAROTID ARTERY REVASCULARIZATIONÂ: SHX6778

## 2022-05-28 HISTORY — DX: Hyperlipidemia, unspecified: E78.5

## 2022-05-28 HISTORY — DX: Gastro-esophageal reflux disease without esophagitis: K21.9

## 2022-05-28 LAB — URINALYSIS, ROUTINE W REFLEX MICROSCOPIC
Bilirubin Urine: NEGATIVE
Glucose, UA: NEGATIVE mg/dL
Hgb urine dipstick: NEGATIVE
Ketones, ur: NEGATIVE mg/dL
Leukocytes,Ua: NEGATIVE
Nitrite: NEGATIVE
Protein, ur: 30 mg/dL — AB
Specific Gravity, Urine: 1.019 (ref 1.005–1.030)
pH: 5 (ref 5.0–8.0)

## 2022-05-28 LAB — SURGICAL PCR SCREEN
MRSA, PCR: NEGATIVE
Staphylococcus aureus: NEGATIVE

## 2022-05-28 LAB — COMPREHENSIVE METABOLIC PANEL
ALT: 43 U/L (ref 0–44)
AST: 27 U/L (ref 15–41)
Albumin: 3.7 g/dL (ref 3.5–5.0)
Alkaline Phosphatase: 75 U/L (ref 38–126)
Anion gap: 10 (ref 5–15)
BUN: 12 mg/dL (ref 6–20)
CO2: 27 mmol/L (ref 22–32)
Calcium: 9.7 mg/dL (ref 8.9–10.3)
Chloride: 105 mmol/L (ref 98–111)
Creatinine, Ser: 0.74 mg/dL (ref 0.44–1.00)
GFR, Estimated: >60 mL/min (ref >60)
Glucose, Bld: 116 mg/dL — ABNORMAL HIGH (ref 70–99)
Potassium: 4.4 mmol/L (ref 3.5–5.1)
Sodium: 142 mmol/L (ref 135–145)
Total Bilirubin: 0.6 mg/dL (ref 0.3–1.2)
Total Protein: 6.8 g/dL (ref 6.5–8.1)

## 2022-05-28 LAB — APTT: aPTT: 29 seconds (ref 24–36)

## 2022-05-28 LAB — CBC
HCT: 40.3 % (ref 36.0–46.0)
Hemoglobin: 13.5 g/dL (ref 12.0–15.0)
MCH: 29.4 pg (ref 26.0–34.0)
MCHC: 33.5 g/dL (ref 30.0–36.0)
MCV: 87.8 fL (ref 80.0–100.0)
Platelets: 274 10*3/uL (ref 150–400)
RBC: 4.59 MIL/uL (ref 3.87–5.11)
RDW: 13.8 % (ref 11.5–15.5)
WBC: 14.9 10*3/uL — ABNORMAL HIGH (ref 4.0–10.5)
nRBC: 0 % (ref 0.0–0.2)

## 2022-05-28 LAB — POCT ACTIVATED CLOTTING TIME: Activated Clotting Time: 347 seconds

## 2022-05-28 LAB — PROTIME-INR
INR: 0.9 (ref 0.8–1.2)
Prothrombin Time: 12.2 seconds (ref 11.4–15.2)

## 2022-05-28 LAB — ABO/RH: ABO/RH(D): O POS

## 2022-05-28 LAB — TYPE AND SCREEN
ABO/RH(D): O POS
Antibody Screen: NEGATIVE

## 2022-05-28 SURGERY — TRANSCAROTID ARTERY REVASCULARIZATION (TCAR)
Anesthesia: General | Laterality: Right

## 2022-05-28 MED ORDER — CHLORHEXIDINE GLUCONATE CLOTH 2 % EX PADS: 6.0000 | MEDICATED_PAD | Freq: Once | CUTANEOUS | Status: DC

## 2022-05-28 MED ORDER — LACTATED RINGERS IV SOLN: INTRAVENOUS | Status: DC | PRN

## 2022-05-28 MED ORDER — HYDRALAZINE HCL 20 MG/ML IJ SOLN: 5.0000 mg | INTRAMUSCULAR | Status: DC | PRN

## 2022-05-28 MED ORDER — HEPARIN 6000 UNIT IRRIGATION SOLUTION
Status: DC | PRN
  Administered 2022-05-28: 1

## 2022-05-28 MED ORDER — PANTOPRAZOLE SODIUM 40 MG PO TBEC
40.0000 mg | DELAYED_RELEASE_TABLET | Freq: Every day | ORAL | Status: DC
  Administered 2022-05-28 – 2022-05-29 (×2): 40 mg via ORAL
  Filled 2022-05-28 (×2): qty 1

## 2022-05-28 MED ORDER — CEFAZOLIN SODIUM-DEXTROSE 2-4 GM/100ML-% IV SOLN
2.0000 g | Freq: Three times a day (TID) | INTRAVENOUS | Status: AC
  Administered 2022-05-28 (×2): 2 g via INTRAVENOUS
  Filled 2022-05-28 (×2): qty 100

## 2022-05-28 MED ORDER — EPHEDRINE SULFATE-NACL 50-0.9 MG/10ML-% IV SOSY
PREFILLED_SYRINGE | INTRAVENOUS | Status: DC | PRN
  Administered 2022-05-28: 10 mg via INTRAVENOUS
  Administered 2022-05-28 (×2): 5 mg via INTRAVENOUS

## 2022-05-28 MED ORDER — FLUOXETINE HCL 10 MG PO CAPS
10.0000 mg | ORAL_CAPSULE | Freq: Every day | ORAL | Status: DC
  Administered 2022-05-28 – 2022-05-29 (×2): 10 mg via ORAL
  Filled 2022-05-28 (×2): qty 1

## 2022-05-28 MED ORDER — FENTANYL CITRATE (PF) 250 MCG/5ML IJ SOLN
INTRAMUSCULAR | Status: AC
  Filled 2022-05-28: qty 5

## 2022-05-28 MED ORDER — LABETALOL HCL 5 MG/ML IV SOLN: 10.0000 mg | INTRAVENOUS | Status: DC | PRN

## 2022-05-28 MED ORDER — HEPARIN 6000 UNIT IRRIGATION SOLUTION
Status: AC
  Filled 2022-05-28: qty 500

## 2022-05-28 MED ORDER — BISACODYL 10 MG RE SUPP: 10.0000 mg | Freq: Every day | RECTAL | Status: DC | PRN

## 2022-05-28 MED ORDER — STERILE WATER FOR IRRIGATION IR SOLN
Status: DC | PRN
  Administered 2022-05-28: 1000 mL

## 2022-05-28 MED ORDER — FENTANYL CITRATE (PF) 250 MCG/5ML IJ SOLN
INTRAMUSCULAR | Status: DC | PRN
  Administered 2022-05-28: 50 ug via INTRAVENOUS
  Administered 2022-05-28: 150 ug via INTRAVENOUS

## 2022-05-28 MED ORDER — FENTANYL CITRATE (PF) 100 MCG/2ML IJ SOLN: 25.0000 ug | INTRAMUSCULAR | Status: DC | PRN

## 2022-05-28 MED ORDER — METOPROLOL TARTRATE 5 MG/5ML IV SOLN: 2.0000 mg | INTRAVENOUS | Status: DC | PRN

## 2022-05-28 MED ORDER — SUGAMMADEX SODIUM 200 MG/2ML IV SOLN
INTRAVENOUS | Status: DC | PRN
  Administered 2022-05-28: 256 mg via INTRAVENOUS

## 2022-05-28 MED ORDER — MORPHINE SULFATE (PF) 2 MG/ML IV SOLN
2.0000 mg | INTRAVENOUS | Status: DC | PRN
  Administered 2022-05-28 (×2): 2 mg via INTRAVENOUS
  Filled 2022-05-28 (×2): qty 1

## 2022-05-28 MED ORDER — PHENYLEPHRINE HCL-NACL 20-0.9 MG/250ML-% IV SOLN
INTRAVENOUS | Status: DC | PRN
  Administered 2022-05-28: 20 ug/min via INTRAVENOUS

## 2022-05-28 MED ORDER — MECLIZINE HCL 25 MG PO TABS
25.0000 mg | ORAL_TABLET | Freq: Two times a day (BID) | ORAL | Status: DC | PRN
Start: 2022-05-28 — End: 2022-05-29

## 2022-05-28 MED ORDER — ROSUVASTATIN CALCIUM 20 MG PO TABS
20.0000 mg | ORAL_TABLET | Freq: Every day | ORAL | Status: DC
  Administered 2022-05-29: 20 mg via ORAL
  Filled 2022-05-28: qty 1

## 2022-05-28 MED ORDER — SODIUM CHLORIDE 0.9 % IV SOLN: 500.0000 mL | Freq: Once | INTRAVENOUS | Status: DC | PRN

## 2022-05-28 MED ORDER — HEPARIN SODIUM (PORCINE) 1000 UNIT/ML IJ SOLN
INTRAMUSCULAR | Status: DC | PRN
  Administered 2022-05-28: 7000 [IU] via INTRAVENOUS

## 2022-05-28 MED ORDER — CEFAZOLIN SODIUM-DEXTROSE 2-4 GM/100ML-% IV SOLN
2.0000 g | INTRAVENOUS | Status: AC
  Administered 2022-05-28: 2 g via INTRAVENOUS
  Filled 2022-05-28: qty 100

## 2022-05-28 MED ORDER — MIDAZOLAM HCL 2 MG/2ML IJ SOLN
INTRAMUSCULAR | Status: DC | PRN
  Administered 2022-05-28: 1 mg via INTRAVENOUS

## 2022-05-28 MED ORDER — ALPRAZOLAM 0.25 MG PO TABS
0.2500 mg | ORAL_TABLET | Freq: Every evening | ORAL | Status: DC | PRN
Start: 2022-05-28 — End: 2022-05-29
  Administered 2022-05-28: 0.25 mg via ORAL
  Filled 2022-05-28: qty 1

## 2022-05-28 MED ORDER — DOCUSATE SODIUM 100 MG PO CAPS: 100.0000 mg | ORAL_CAPSULE | Freq: Every day | ORAL | Status: DC

## 2022-05-28 MED ORDER — IODIXANOL 320 MG/ML IV SOLN
INTRAVENOUS | Status: DC | PRN
  Administered 2022-05-28: 15 mL via INTRA_ARTERIAL

## 2022-05-28 MED ORDER — ROCURONIUM BROMIDE 10 MG/ML (PF) SYRINGE
PREFILLED_SYRINGE | INTRAVENOUS | Status: DC | PRN
  Administered 2022-05-28: 40 mg via INTRAVENOUS

## 2022-05-28 MED ORDER — PHENOL 1.4 % MT LIQD: 1.0000 | OROMUCOSAL | Status: DC | PRN

## 2022-05-28 MED ORDER — ASPIRIN 325 MG PO TBEC
325.0000 mg | DELAYED_RELEASE_TABLET | Freq: Every day | ORAL | Status: DC
  Administered 2022-05-29: 325 mg via ORAL
  Filled 2022-05-28: qty 1

## 2022-05-28 MED ORDER — ONDANSETRON HCL 4 MG/2ML IJ SOLN: 4.0000 mg | Freq: Four times a day (QID) | INTRAMUSCULAR | Status: DC | PRN

## 2022-05-28 MED ORDER — ONDANSETRON HCL 4 MG/2ML IJ SOLN
INTRAMUSCULAR | Status: DC | PRN
  Administered 2022-05-28: 4 mg via INTRAVENOUS

## 2022-05-28 MED ORDER — DEXAMETHASONE SODIUM PHOSPHATE 10 MG/ML IJ SOLN
INTRAMUSCULAR | Status: DC | PRN
  Administered 2022-05-28: 10 mg via INTRAVENOUS

## 2022-05-28 MED ORDER — POLYETHYLENE GLYCOL 3350 17 G PO PACK: 17.0000 g | PACK | Freq: Every day | ORAL | Status: DC | PRN

## 2022-05-28 MED ORDER — CHLORHEXIDINE GLUCONATE 0.12 % MT SOLN
OROMUCOSAL | Status: AC
  Administered 2022-05-28: 15 mL
  Filled 2022-05-28: qty 15

## 2022-05-28 MED ORDER — MIDAZOLAM HCL 2 MG/2ML IJ SOLN
INTRAMUSCULAR | Status: AC
  Filled 2022-05-28: qty 2

## 2022-05-28 MED ORDER — MAGNESIUM SULFATE 2 GM/50ML IV SOLN: 2.0000 g | Freq: Every day | INTRAVENOUS | Status: DC | PRN

## 2022-05-28 MED ORDER — PROTAMINE SULFATE 10 MG/ML IV SOLN
INTRAVENOUS | Status: DC | PRN
  Administered 2022-05-28: 50 mg via INTRAVENOUS

## 2022-05-28 MED ORDER — POTASSIUM CHLORIDE CRYS ER 20 MEQ PO TBCR: 20.0000 meq | EXTENDED_RELEASE_TABLET | Freq: Every day | ORAL | Status: DC | PRN

## 2022-05-28 MED ORDER — ACETAMINOPHEN 10 MG/ML IV SOLN
INTRAVENOUS | Status: AC
  Filled 2022-05-28: qty 100

## 2022-05-28 MED ORDER — ALUM & MAG HYDROXIDE-SIMETH 200-200-20 MG/5ML PO SUSP
15.0000 mL | ORAL | Status: DC | PRN
  Administered 2022-05-28: 30 mL via ORAL
  Filled 2022-05-28: qty 30

## 2022-05-28 MED ORDER — PROPOFOL 10 MG/ML IV BOLUS
INTRAVENOUS | Status: DC | PRN
  Administered 2022-05-28: 200 mg via INTRAVENOUS

## 2022-05-28 MED ORDER — GUAIFENESIN-DM 100-10 MG/5ML PO SYRP: 15.0000 mL | ORAL_SOLUTION | ORAL | Status: DC | PRN

## 2022-05-28 MED ORDER — LIDOCAINE 2% (20 MG/ML) 5 ML SYRINGE
INTRAMUSCULAR | Status: DC | PRN
  Administered 2022-05-28: 60 mg via INTRAVENOUS

## 2022-05-28 MED ORDER — HEMOSTATIC AGENTS (NO CHARGE) OPTIME
TOPICAL | Status: DC | PRN
  Administered 2022-05-28: 1 via TOPICAL

## 2022-05-28 MED ORDER — ACETAMINOPHEN 500 MG PO TABS
1000.0000 mg | ORAL_TABLET | Freq: Once | ORAL | Status: AC
Start: 2022-05-28 — End: 2022-05-28
  Administered 2022-05-28: 1000 mg via ORAL

## 2022-05-28 MED ORDER — OXYCODONE-ACETAMINOPHEN 5-325 MG PO TABS: 1.0000 | ORAL_TABLET | ORAL | Status: DC | PRN

## 2022-05-28 MED ORDER — ACETAMINOPHEN 650 MG RE SUPP: 325.0000 mg | RECTAL | Status: DC | PRN

## 2022-05-28 MED ORDER — SODIUM CHLORIDE 0.9 % IV SOLN: INTRAVENOUS | Status: DC

## 2022-05-28 MED ORDER — PANTOPRAZOLE SODIUM 40 MG PO TBEC: 40.0000 mg | DELAYED_RELEASE_TABLET | Freq: Every day | ORAL | Status: DC | PRN

## 2022-05-28 MED ORDER — SODIUM CHLORIDE 0.9 % IV SOLN
INTRAVENOUS | Status: DC
Start: 2022-05-28 — End: 2022-05-29

## 2022-05-28 MED ORDER — ACETAMINOPHEN 325 MG PO TABS: 325.0000 mg | ORAL_TABLET | ORAL | Status: DC | PRN

## 2022-05-28 MED ORDER — AMISULPRIDE (ANTIEMETIC) 5 MG/2ML IV SOLN: 10.0000 mg | Freq: Once | INTRAVENOUS | Status: DC | PRN

## 2022-05-28 MED ORDER — PROPOFOL 500 MG/50ML IV EMUL
INTRAVENOUS | Status: DC | PRN
  Administered 2022-05-28: 75 ug/kg/min via INTRAVENOUS

## 2022-05-28 MED ORDER — CLOPIDOGREL BISULFATE 75 MG PO TABS
75.0000 mg | ORAL_TABLET | Freq: Every day | ORAL | Status: DC
  Administered 2022-05-29: 75 mg via ORAL
  Filled 2022-05-28: qty 1

## 2022-05-28 MED ORDER — 0.9 % SODIUM CHLORIDE (POUR BTL) OPTIME
TOPICAL | Status: DC | PRN
  Administered 2022-05-28: 1000 mL

## 2022-05-28 MED ORDER — PHENYLEPHRINE 80 MCG/ML (10ML) SYRINGE FOR IV PUSH (FOR BLOOD PRESSURE SUPPORT)
PREFILLED_SYRINGE | INTRAVENOUS | Status: DC | PRN
  Administered 2022-05-28: 160 ug via INTRAVENOUS

## 2022-05-28 MED ORDER — GLYCOPYRROLATE PF 0.2 MG/ML IJ SOSY
PREFILLED_SYRINGE | INTRAMUSCULAR | Status: DC | PRN
  Administered 2022-05-28 (×2): .2 mg via INTRAVENOUS

## 2022-05-28 SURGICAL SUPPLY — 58 items
ADH SKN CLS APL DERMABOND .7 (GAUZE/BANDAGES/DRESSINGS) ×3
BAG BANDED W/RUBBER/TAPE 36X54 (MISCELLANEOUS) ×3 IMPLANT
BAG COUNTER SPONGE SURGICOUNT (BAG) ×3 IMPLANT
BAG EQP BAND 135X91 W/RBR TAPE (MISCELLANEOUS) ×1
BAG SPNG CNTER NS LX DISP (BAG) ×1
BALLN STERLING RX 5X30X80 (BALLOONS) ×2
BALLOON STERLING RX 5X30X80 (BALLOONS) IMPLANT
CANISTER SUCT 3000ML PPV (MISCELLANEOUS) ×3 IMPLANT
CATH ROBINSON RED A/P 18FR (CATHETERS) IMPLANT
CLIP LIGATING EXTRA MED SLVR (CLIP) ×3 IMPLANT
CLIP LIGATING EXTRA SM BLUE (MISCELLANEOUS) ×3 IMPLANT
COVER DOME SNAP 22 D (MISCELLANEOUS) ×3 IMPLANT
COVER PROBE W GEL 5X96 (DRAPES) ×3 IMPLANT
DERMABOND ADVANCED (GAUZE/BANDAGES/DRESSINGS) ×3
DERMABOND ADVANCED .7 DNX12 (GAUZE/BANDAGES/DRESSINGS) ×2 IMPLANT
DRAPE FEMORAL ANGIO 80X135IN (DRAPES) ×3 IMPLANT
ELECT REM PT RETURN 9FT ADLT (ELECTROSURGICAL) ×2
ELECTRODE REM PT RTRN 9FT ADLT (ELECTROSURGICAL) ×2 IMPLANT
GLOVE BIO SURGEON STRL SZ7.5 (GLOVE) ×3 IMPLANT
GLOVE SRG 8 PF TXTR STRL LF DI (GLOVE) ×2 IMPLANT
GLOVE SURG POLYISO LF SZ8 (GLOVE) IMPLANT
GLOVE SURG UNDER POLY LF SZ8 (GLOVE) ×2
GOWN STRL REUS W/ TWL LRG LVL3 (GOWN DISPOSABLE) ×4 IMPLANT
GOWN STRL REUS W/TWL 2XL LVL3 (GOWN DISPOSABLE) ×3 IMPLANT
GOWN STRL REUS W/TWL LRG LVL3 (GOWN DISPOSABLE) ×4
GUIDEWIRE ENROUTE 0.014 (WIRE) ×3 IMPLANT
HEMOSTAT SNOW SURGICEL 2X4 (HEMOSTASIS) ×1 IMPLANT
INTRODUCER KIT GALT 7CM (INTRODUCER) ×2
KIT BASIN OR (CUSTOM PROCEDURE TRAY) ×3 IMPLANT
KIT ENCORE 26 ADVANTAGE (KITS) ×3 IMPLANT
KIT INTRODUCER GALT 7 (INTRODUCER) ×2 IMPLANT
KIT MICROPUNCTURE NIT STIFF (SHEATH) ×1 IMPLANT
KIT TURNOVER KIT B (KITS) ×3 IMPLANT
NDL HYPO 25GX1X1/2 BEV (NEEDLE) IMPLANT
NEEDLE HYPO 25GX1X1/2 BEV (NEEDLE) IMPLANT
PACK CAROTID (CUSTOM PROCEDURE TRAY) ×3 IMPLANT
PENCIL BUTTON HOLSTER BLD 10FT (ELECTRODE) ×1 IMPLANT
POSITIONER HEAD DONUT 9IN (MISCELLANEOUS) ×3 IMPLANT
PROTECTION STATION PRESSURIZED (MISCELLANEOUS) ×2
SET MICROPUNCTURE 5F STIFF (MISCELLANEOUS) ×3 IMPLANT
STATION PROTECTION PRESSURIZED (MISCELLANEOUS) ×2 IMPLANT
STENT TRANSCAROTID SYSTEM 8X30 (Permanent Stent) ×1 IMPLANT
SUT MNCRL AB 4-0 PS2 18 (SUTURE) ×3 IMPLANT
SUT PROLENE 5 0 C 1 24 (SUTURE) ×4 IMPLANT
SUT SILK 2 0 PERMA HAND 18 BK (SUTURE) IMPLANT
SUT SILK 2 0 SH (SUTURE) ×3 IMPLANT
SUT SILK 3 0 (SUTURE)
SUT SILK 3-0 18XBRD TIE 12 (SUTURE) IMPLANT
SUT VIC AB 3-0 SH 27 (SUTURE) ×2
SUT VIC AB 3-0 SH 27X BRD (SUTURE) ×2 IMPLANT
SYR 10ML LL (SYRINGE) ×9 IMPLANT
SYR 20ML LL LF (SYRINGE) ×3 IMPLANT
SYR CONTROL 10ML LL (SYRINGE) IMPLANT
SYSTEM TRANSCAROTID NEUROPRTCT (MISCELLANEOUS) ×2 IMPLANT
TOWEL GREEN STERILE (TOWEL DISPOSABLE) ×3 IMPLANT
TRANSCAROTID NEUROPROTECT SYS (MISCELLANEOUS) ×2
WATER STERILE IRR 1000ML POUR (IV SOLUTION) ×3 IMPLANT
WIRE BENTSON .035X145CM (WIRE) ×3 IMPLANT

## 2022-05-28 NOTE — Anesthesia Procedure Notes (Signed)
Arterial Line Insertion Start/End7/04/2022 7:00 AM, 05/28/2022 7:10 AM Performed by: Suzette Battiest, MD, Minerva Ends, CRNA, CRNA  Patient location: Pre-op. Preanesthetic checklist: patient identified, IV checked, site marked, risks and benefits discussed, surgical consent, monitors and equipment checked, pre-op evaluation, timeout performed and anesthesia consent Lidocaine 1% used for infiltration Right, radial was placed Catheter size: 20 G Hand hygiene performed  and maximum sterile barriers used   Attempts: 1 Procedure performed without using ultrasound guided technique. Following insertion, dressing applied and Biopatch. Post procedure assessment: normal and unchanged  Patient tolerated the procedure well with no immediate complications.

## 2022-05-28 NOTE — Anesthesia Procedure Notes (Signed)
Procedure Name: Intubation Date/Time: 05/28/2022 7:47 AM  Performed by: Minerva Ends, CRNAPre-anesthesia Checklist: Patient identified, Emergency Drugs available, Suction available and Patient being monitored Patient Re-evaluated:Patient Re-evaluated prior to induction Oxygen Delivery Method: Circle system utilized Preoxygenation: Pre-oxygenation with 100% oxygen Induction Type: IV induction Ventilation: Mask ventilation without difficulty Laryngoscope Size: Mac and 3 Grade View: Grade I Tube type: Oral Tube size: 7.0 mm Number of attempts: 1 Airway Equipment and Method: Stylet and Oral airway Placement Confirmation: ETT inserted through vocal cords under direct vision, positive ETCO2 and breath sounds checked- equal and bilateral Secured at: 22 cm Tube secured with: Tape Dental Injury: Teeth and Oropharynx as per pre-operative assessment

## 2022-05-28 NOTE — Progress Notes (Signed)
  Day of Surgery Note    Subjective:  awake in recovery; resting comfortably   Vitals:   05/28/22 0600 05/28/22 0948  BP: (!) 149/64 (!) 101/50  Pulse: 77 80  Resp: 18   Temp: 98.5 F (36.9 C) 97.7 F (36.5 C)  SpO2: 97% 94%    Incisions:   right neck incision is clean and dry; left groin is soft without hematoma Extremities:  moving all extremities equally; tongue is midline  Cardiac:  regular Lungs:  non labored    Assessment/Plan:  This is a 61 y.o. female who is s/p  Right TCAR  -pt doing well in recovery with neuro in tact -asa/plavix/statin continued -right femoral psa duplex ordered -to [place] later today    [employer], PA-C 05/28/2022 10:05 AM [phone]

## 2022-05-28 NOTE — Anesthesia Postprocedure Evaluation (Signed)
Anesthesia Post Note  Patient: Diamond Adams  Procedure(s) Performed: Transcarotid Artery Revascularization (Right)     Patient location during evaluation: PACU Anesthesia Type: General Level of consciousness: awake and alert Pain management: pain level controlled Vital Signs Assessment: post-procedure vital signs reviewed and stable Respiratory status: spontaneous breathing, nonlabored ventilation, respiratory function stable and patient connected to nasal cannula oxygen Cardiovascular status: blood pressure returned to baseline and stable Postop Assessment: no apparent nausea or vomiting Anesthetic complications: no   No notable events documented.  Last Vitals:  Vitals:   05/28/22 1051 05/28/22 1123  BP:  (!) 104/52  Pulse: 60 64  Resp: 11 19  Temp: 36.9 C 36.4 C  SpO2: 99% 100%    Last Pain:  Vitals:   05/28/22 1123  TempSrc: Oral  PainSc: 0-No pain                 Tiajuana Amass

## 2022-05-28 NOTE — Discharge Instructions (Signed)
   [facility]  Discharge Instructions   Carotid Surgery  Please refer to the following instructions for your post-procedure care. Your surgeon or physician assistant will discuss any changes with you.  Activity  You are encouraged to walk as much as you can. You can slowly return to normal activities but must avoid strenuous activity and heavy lifting until your doctor tell you it's okay. Avoid activities such as vacuuming or swinging a golf club. You can drive after one week if you are comfortable and you are no longer taking prescription pain medications. It is normal to feel tired for serval weeks after your surgery. It is also normal to have difficulty with sleep habits, eating, and bowel movements after surgery. These will go away with time.  Bathing/Showering  Shower daily after you go home. Do not soak in a bathtub, hot tub, or swim until the incision heals completely.  Incision Care  Shower every day. Clean your incision with mild soap and water. Pat the area dry with a clean towel. You do not need a bandage unless otherwise instructed. Do not apply any ointments or creams to your incision. You may have skin glue on your incision. Do not peel it off. It will come off on its own in about one week. Your incision may feel thickened and raised for several weeks after your surgery. This is normal and the skin will soften over time.   For Men Only: It's okay to shave around the incision but do not shave the incision itself for 2 weeks. It is common to have numbness under your chin that could last for several months.  Diet  Resume your normal diet. There are no special food restrictions following this procedure. A low fat/low cholesterol diet is recommended for all patients with vascular disease. In order to heal from your surgery, it is CRITICAL to get adequate nutrition. Your body requires vitamins, minerals, and protein. Vegetables are the best source of  vitamins and minerals. Vegetables also provide the perfect balance of protein. Processed food has little nutritional value, so try to avoid this.  Medications  Resume taking all of your medications unless your doctor or physician assistant tells you not to. If your incision is causing pain, you may take over-the- counter pain relievers such as acetaminophen (Tylenol). If you were prescribed a stronger pain medication, please be aware these medications can cause nausea and constipation. Prevent nausea by taking the medication with a snack or meal. Avoid constipation by drinking plenty of fluids and eating foods with a high amount of fiber, such as fruits, vegetables, and grains.   Do not take Tylenol if you are taking prescription pain medications.  Follow Up  Our office will schedule a follow up appointment 2-3 weeks following discharge.  Please call us immediately for any of the following conditions  . Increased pain, redness, drainage (pus) from your incision site. . Fever of 101 degrees or higher. . If you should develop stroke (slurred speech, difficulty swallowing, weakness on one side of your body, loss of vision) you should call 911 and go to the nearest emergency room. .  Reduce your risk of vascular disease:  . Stop smoking. If you would like help call [facility] at 1-800-QUIT-NOW (1-800-784-8669) or [facility] at 336-586-4000. . Manage your cholesterol . Maintain a desired weight . Control your diabetes . Keep your blood pressure down .  If you have any questions, please call the office at [phone]. 

## 2022-05-28 NOTE — Progress Notes (Signed)
Pseudoaneurysm right groin check study completed.   Please see CV Proc for preliminary results.   Darlin Coco, RDMS, RVT

## 2022-05-28 NOTE — H&P (Signed)
MRN #:  [id number]  History of Present Illness: This is a 61 y.o. female presenting today in preoperative holding for right-sided transcarotid artery revascularization.  Diamond Adams has a history of right-sided, symptomatic ICA stenosis.  She was recently discharged from physical medicine and rehab after suffering a stroke.  On exam today, Diamond Adams denies new TIA, stroke, amaurosis.  She has had severe anxiety regarding this procedure.  Past Medical History:  Diagnosis Date   Anxiety disorder    Depression    Fibromyalgia    GERD (gastroesophageal reflux disease)    Herpes    Hyperlipidemia    Hypertension    PONV (postoperative nausea and vomiting)    Stroke ([place]) 05/17/2022    Past Surgical History:  Procedure Laterality Date   ABDOMINAL HYSTERECTOMY  1994   AORTIC ARCH ANGIOGRAPHY N/A 05/20/2022   Procedure: AORTIC ARCH ANGIOGRAPHY;  Surgeon: Broadus John, MD;  Location: [facility];  Service: Cardiovascular;  Laterality: N/A;   CAROTID ANGIOGRAPHY N/A 05/20/2022   Procedure: CAROTID ANGIOGRAPHY;  Surgeon: Broadus John, MD;  Location: [facility];  Service: Cardiovascular;  Laterality: N/A;   CARPAL TUNNEL RELEASE  2013,2011   right and left   EXCISION NASAL MASS Left 09/25/2013   Procedure: EXCISION NASAL CANCER WITH SPLIT THICKNESS SKIN GRAFT FROM RIGHT THIGH;  Surgeon: Ascencion Dike, MD;  Location: [facility];  Service: ENT;  Laterality: Left;   RIGHT OOPHORECTOMY     THUMB ARTHROSCOPY     right   TONSILLECTOMY     TUBAL LIGATION      Allergies  Allergen Reactions   Trazodone And Nefazodone Hives    Prior to Admission medications   Medication Sig Start Date End Date Taking? Authorizing Provider  acetaminophen (TYLENOL) 325 MG tablet Take 1-2 tablets (325-650 mg total) by mouth every 4 (four) hours as needed for mild pain. 05/26/22  Yes Setzer, Edman Circle, PA-C  ALPRAZolam Duanne Moron) 0.25 MG tablet Take 1 tablet (0.25 mg total) by mouth at bedtime  as needed for anxiety. 05/26/22  Yes Setzer, Edman Circle, PA-C  aspirin EC 325 MG tablet Take 1 tablet (325 mg total) by mouth daily. 05/26/22 07/25/22 Yes Setzer, Edman Circle, PA-C  clopidogrel (PLAVIX) 75 MG tablet Take 1 tablet (75 mg total) by mouth daily. 05/26/22 07/25/22 Yes Setzer, Edman Circle, PA-C  FLUoxetine (PROZAC) 10 MG capsule Take 1 capsule (10 mg total) by mouth daily. 05/26/22  Yes Setzer, Edman Circle, PA-C  meclizine (ANTIVERT) 25 MG tablet Take 25 mg by mouth 2 (two) times daily as needed for dizziness. 02/23/22  Yes [provider]  pantoprazole (PROTONIX) 40 MG tablet Take 1 tablet (40 mg total) by mouth daily as needed (reflux). 05/26/22  Yes Setzer, Edman Circle, PA-C  rosuvastatin (CRESTOR) 20 MG tablet Take 1 tablet (20 mg total) by mouth daily. 05/26/22 07/25/22 Yes Setzer, Edman Circle, PA-C  magnesium gluconate (MAGONATE) 500 MG tablet Take 0.5 tablets (250 mg total) by mouth at bedtime. 05/26/22   Setzer, Edman Circle, PA-C    Social History   Socioeconomic History   Marital status: Divorced    Spouse name: Not on file   Number of children: Not on file   Years of education: Not on file   Highest education level: Not on file  Occupational History   Not on file  Tobacco Use   Smoking status: Former    Packs/day: 0.50    Types: Cigarettes  Quit date: 05/17/2022    Years since quitting: 0.0   Smokeless tobacco: Never   Tobacco comments:    Last time smoking was 05/20/22.  Vaping Use   Vaping Use: Never used  Substance and Sexual Activity   Alcohol use: No   Drug use: No   Sexual activity: Yes    Birth control/protection: Surgical  Other Topics Concern   Not on file  Social History Narrative   Not on file   Social Determinants of Health   Financial Resource Strain: Not on file  Food Insecurity: Not on file  Transportation Needs: Not on file  Physical Activity: Not on file  Stress: Not on file  Social Connections: Not on file  Intimate Partner Violence: Not on file    Family  History  Problem Relation Age of Onset   Heart disease Father    Emphysema Mother    Cancer Sister        cervical    ROS: Otherwise negative unless mentioned in HPI  Physical Examination  Vitals:   05/28/22 0600  BP: (!) 149/64  Pulse: 77  Resp: 18  Temp: 98.5 F (36.9 C)  SpO2: 97%   Body mass index is 29.47 kg/m.  General:  WDWN in NAD Gait: Not observed HENT: WNL, normocephalic Pulmonary: normal non-labored breathing, without Rales, rhonchi,  wheezing Cardiac: regular Abdomen: soft, NT/ND, no masses Skin: without rashes Vascular Exam/Pulses: 2+ femoral pulses bilaterally, ecchymosis of the right leg from previous cerebral angiogram Extremities: without ischemic changes, without Gangrene , without cellulitis; without open wounds;  Musculoskeletal: no muscle wasting or atrophy  Neurologic: A&O X 3;  No focal weakness or paresthesias are detected; speech is fluent/normal Psychiatric:  The pt has Normal affect. Lymph:  Unremarkable  CBC    Component Value Date/Time   WBC 14.9 (H) 05/28/2022 0623   RBC 4.59 05/28/2022 0623   HGB 13.5 05/28/2022 0623   HCT 40.3 05/28/2022 0623   PLT 274 05/28/2022 0623   MCV 87.8 05/28/2022 0623   MCH 29.4 05/28/2022 0623   MCHC 33.5 05/28/2022 0623   RDW 13.8 05/28/2022 0623   LYMPHSABS 3.5 05/22/2022 0540   MONOABS 0.9 05/22/2022 0540   EOSABS 0.2 05/22/2022 0540   BASOSABS 0.1 05/22/2022 0540    BMET    Component Value Date/Time   NA 142 05/28/2022 0623   K 4.4 05/28/2022 0623   CL 105 05/28/2022 0623   CO2 27 05/28/2022 0623   GLUCOSE 116 (H) 05/28/2022 0623   BUN 12 05/28/2022 0623   CREATININE 0.74 05/28/2022 0623   CALCIUM 9.7 05/28/2022 0623   GFRNONAA >60 05/28/2022 0623   GFRAA  09/01/2009 1740    >60        The eGFR has been calculated using the MDRD equation. This calculation has not been validated in all clinical situations. eGFR's persistently <60 mL/min signify possible Chronic Kidney Disease.     COAGS: Lab Results  Component Value Date   INR 0.9 05/28/2022     ASSESSMENT/PLAN: This is a 61 y.o. female with symptomatic right-sided ICA stenosis.  She presents today for TCAR.  She denies new strokelike symptoms, TIA, amaurosis.  On physical exam, she had significant ecchymosis in the right leg.  Palpable pulse, hematoma appreciated in the groin.  Diamond Adams may have a small pseudoaneurysm present.  During her hospital admission, I will order an ultrasound to evaluate.  After discussing the risk and benefits of right-sided transcarotid artery revascularization in an effort  to decrease future stroke risk and improve perfusion to the brain, Diamond Adams elected to proceed.   Cassandria Santee MD MS [facility] [phone] 05/28/2022  7:26 AM

## 2022-05-28 NOTE — Op Note (Signed)
NAME: Diamond Adams    MRN: [id number] DOB: 07-10-1961    DATE OF OPERATION: 05/28/2022  PREOP DIAGNOSIS:    Symptomatic right internal carotid artery stenosis  POSTOP DIAGNOSIS:    Same  PROCEDURE:    Right-sided transcarotid artery revascularization  SURGEON: Broadus John  ASSIST: Leontine Locket, PA  ANESTHESIA: General  EBL: 25m  INDICATIONS:    Diamond NATZKEis a 61y.o. female with symptomatic right-sided ICA stenosis.  She presents today for TCAR.  She denies new strokelike symptoms, TIA, amaurosis.  On physical exam, she had significant ecchymosis in the right leg.  Palpable pulse, hematoma appreciated in the groin.  Diamond Adams have a small pseudoaneurysm present.  During her hospital admission, I will order an ultrasound to evaluate.   After discussing the risk and benefits of right-sided transcarotid artery revascularization in an effort to decrease future stroke risk and improve perfusion to the brain, Diamond Adams to proceed.  FINDINGS:   Greater than 90% stenosis of the right internal carotid artery  TECHNIQUE:   The patient was brought to the operating room, where support lines were placed and general anesthesia was secured. The right neck and left groin were prepped and the patient was sterilely draped. A transverse 2-4 cm incision was made between the sternal and clavicular heads of the sternocleidomastoid muscle, below the omohyoid. Following longitudinal division of the carotid sheath the jugular vein was partially dissected and retracted medially. Once 3 cm of common carotid artery (CCA) were isolated, umbilical tape was placed around the proximal 1/3 of the CCA under direct vision. A 5.0 polypropylene suture was pre-placed in the anterior wall of the CCA, in a "U stitch" configuration, close to the clavicle to facilitate hemostasis upon removal of the arterial sheath at completion of the TCAR procedure.  The contralateral (left) common femoral vein (CFV)  was accessed under ultrasound guidance, using standard Seldinger and micropuncture access technique. Permanent recorded image(s) was/were saved in the patient's medical record. The Venous Return Sheath was advanced into the CFV over the 0.035" wire provided. Blood was aspirated from the flow line followed by flushing of the Venous Sheath with heparinized saline. The Venous Sheath was secured to the patient's skin with suture to maintain optimal position in the vessel.  Heparin was given to obtain a therapeutic activated clotting time >250 seconds prior to arterial access. A 4-French non-stiffened ENHANCE Transcarotid / Peripheral Access set was used, puncturing the artery with the 21G needle through the pre-placed "U" stitch while holding gentle traction on the umbilical tape to stabilize and centralize the CCA within the incision. Careful attention was paid to the change in CCA shape when using the umbilical tape to control or lift the artery. The micropuncture wire was then advanced 3-4 cm into the CCA and, the 21G needle was removed. The micropuncture sheath was advanced 2-3 cm into the CCA and the wire and dilator were removed. Pulsatile backflow indicated correct positioning. The provided 0.035" J-tipped guidewire was inserted as close as possible to the bifurcation without engaging the lesion. After micropuncture sheath removal, the Transcarotid Arterial Sheath was advanced to the 2.5cm marker and the 0.035" wire and dilator were then removed. Arterial Sheath position was assessed under fluoroscopy in two projections to ensure that the sheath tip was oriented coaxially in the CCA. The Arterial Sheath was sutured to the patient with gentle forward tension. Blood was slowly aspirated followed by flushing with heparinized saline. No ingress of air  bubbles through the passive hemostatic valve was observed. The stopcocks were closed. Traction applied to the CCA previously to facilitate access was gently  released.  The Flow Controller was connected to the Transcarotid Arterial Sheath, prepared by passively allowing a column of arterial blood to fill the line and connected to the Venous Return Sheath. CCA inflow was occluded proximal to the arteriotomy with a vascular clamp to achieve active flow reversal. To confirm flow reversal, a saline bolus was delivered into the venous flow line on both "High" and "Low" flow settings of the Flow Controller. Angiograms were performed with slow injections of a small amount of contrast filling just past the lesion to minimize antegrade transmission of micro-bubbles.  Prior to lesion manipulation, heart rate (42VZD) and systolic BP ([id number]) were managed upwards to optimize flow reversal and procedural neuroprotection. The lesion was crossed with an 0.014" ENROUTE guidewire and pre-dilation of the lesion was performed with a 96m x 317mrapid exchange 0.014" compatible balloon catheter to 8 atmospheres for 10 seconds. Stenting was performed with an 62m72m 42m32mROUTE Transcarotid stent, sized appropriately to the right CCA. AP and lateral angiograms (gentle contrast injections) were performed to confirm stent placement and arterial wall stent apposition.  [facility], antegrade flow was restored by releasing the clamp on the CCA then closing the NPS stopcocks to the flow lines. The Transcarotid Arterial Sheath was removed and the pre-closure suture was tied. Heparin reversal was employed.  Loretta protamine reaction resulting in hypotension with a systolic blood pressure of 40 mmHg.  Once blood pressure normalized, an ultrasound was used to ensure the stent remained patent.  Duplex ultrasonography demonstrated flow with normal, low resistance internal carotid artery waveform.  The Venous Return Sheath was removed and hemostasis was achieved with brief manual compression.  The patient tolerated the procedure well and was extubated on the table. The  patient was moving all four extremities to command prior to transfer to the recovery room.  Given the complexity of the case a first assistant was necessary in order to expedient the procedure and safely perform the technical aspects of the operation.  JoshMacie Burows [facility]:   05/28/2022

## 2022-05-28 NOTE — Transfer of Care (Signed)
Immediate Anesthesia Transfer of Care Note  Patient: Diamond Adams  Procedure(s) Performed: Transcarotid Artery Revascularization (Right)  Patient Location: PACU  Anesthesia Type:General  Level of Consciousness: awake, alert  and oriented  Airway & Oxygen Therapy: Patient Spontanous Breathing  Post-op Assessment: Report given to RN and Post -op Vital signs reviewed and stable  Post vital signs: Reviewed and stable  Last Vitals:  Vitals Value Taken Time  BP 101/50 05/28/22 0948  Temp    Pulse 79 05/28/22 0951  Resp 8 05/28/22 0951  SpO2 94 % 05/28/22 0951  Vitals shown include unvalidated device data.  Last Pain:  Vitals:   05/28/22 0614  TempSrc:   PainSc: 3          Complications: No notable events documented.

## 2022-05-29 ENCOUNTER — Encounter (HOSPITAL_COMMUNITY): Payer: Self-pay | Admitting: Vascular Surgery

## 2022-05-29 ENCOUNTER — Other Ambulatory Visit (HOSPITAL_COMMUNITY): Payer: Self-pay

## 2022-05-29 LAB — CBC
HCT: 30.5 % — ABNORMAL LOW (ref 36.0–46.0)
Hemoglobin: 10.3 g/dL — ABNORMAL LOW (ref 12.0–15.0)
MCH: 29.5 pg (ref 26.0–34.0)
MCHC: 33.8 g/dL (ref 30.0–36.0)
MCV: 87.4 fL (ref 80.0–100.0)
Platelets: 239 10*3/uL (ref 150–400)
RBC: 3.49 MIL/uL — ABNORMAL LOW (ref 3.87–5.11)
RDW: 13.9 % (ref 11.5–15.5)
WBC: 17.7 10*3/uL — ABNORMAL HIGH (ref 4.0–10.5)
nRBC: 0 % (ref 0.0–0.2)

## 2022-05-29 LAB — BASIC METABOLIC PANEL
Anion gap: 5 (ref 5–15)
BUN: 9 mg/dL (ref 6–20)
CO2: 23 mmol/L (ref 22–32)
Calcium: 8.5 mg/dL — ABNORMAL LOW (ref 8.9–10.3)
Chloride: 111 mmol/L (ref 98–111)
Creatinine, Ser: 0.62 mg/dL (ref 0.44–1.00)
GFR, Estimated: >60 mL/min (ref >60)
Glucose, Bld: 123 mg/dL — ABNORMAL HIGH (ref 70–99)
Potassium: 3.7 mmol/L (ref 3.5–5.1)
Sodium: 139 mmol/L (ref 135–145)

## 2022-05-29 LAB — LIPID PANEL
Cholesterol: 118 mg/dL (ref 0–200)
HDL: 40 mg/dL — ABNORMAL LOW (ref >40)
LDL Cholesterol: 55 mg/dL (ref 0–99)
Total CHOL/HDL Ratio: 3 RATIO
Triglycerides: 113 mg/dL (ref <150)
VLDL: 23 mg/dL (ref 0–40)

## 2022-05-29 MED ORDER — OXYCODONE-ACETAMINOPHEN 5-325 MG PO TABS
1.0000 | ORAL_TABLET | Freq: Four times a day (QID) | ORAL | 0 refills | Status: DC | PRN
  Filled 2022-05-29: qty 15, 4d supply, fill #0

## 2022-05-29 NOTE — Progress Notes (Addendum)
  Day of Surgery Note    Subjective:  feeling baseline. Some trouble with deep breaths, no chest pain.    Vitals:   05/29/22 0500 05/29/22 0549  BP:    Pulse:  (!) 54  Resp: 20 18  Temp:    SpO2:      Incisions:   right neck incision is clean and dry; left groin is soft without hematoma Extremities:  moving all extremities equally; tongue is midline  Cardiac:  regular Lungs:  non labored    Assessment/Plan:  This is a 61 y.o. female who is s/p  Right TCAR. No right groin pseudoaneurysm   -Pt doing well neuro intact -asa/plavix/statin continued -normotensive this morning, hypotensive overnight -no chest pain, encouraged deep breaths, I/S  Home today at lunch pending normotension continues and no chest pain.     Broadus John MD 05/29/2022 8:04 AM [phone]

## 2022-05-29 NOTE — Progress Notes (Signed)
PHARMACIST LIPID MONITORING   Diamond Adams is a 61 y.o. female admitted on 05/28/2022 with ICA stenosis.  Pharmacy has been consulted to optimize lipid-lowering therapy with the indication of secondary prevention for clinical ASCVD.  Recent Labs:  Lipid Panel (last 6 months):   Lab Results  Component Value Date   CHOL 118 05/29/2022   TRIG 113 05/29/2022   HDL 40 (L) 05/29/2022   CHOLHDL 3.0 05/29/2022   VLDL 23 05/29/2022   LDLCALC 55 05/29/2022    Hepatic function panel (last 6 months):   Lab Results  Component Value Date   AST 27 05/28/2022   ALT 43 05/28/2022   ALKPHOS 75 05/28/2022   BILITOT 0.6 05/28/2022    SCr (since admission):   Serum creatinine: 0.62 mg/dL 05/29/22 0058 Estimated creatinine clearance: 59.9 mL/min  Current therapy and lipid therapy tolerance Current lipid-lowering therapy: rosuvastatin 20 mg PO daily  Previous lipid-lowering therapies (if applicable): pravastatin 40 mg PO daily Documented or reported allergies or intolerances to lipid-lowering therapies (if applicable): N/A  Assessment:   Patient agrees with changes to lipid-lowering therapy  Plan:    1.Statin intensity (high intensity recommended for all patients regardless of the LDL):  No statin changes. The patient is already on a high intensity statin.  2.Add ezetimibe (if any one of the following):   Not indicated at this time.  3.Refer to lipid clinic:   No  4.Follow-up with:  Primary care provider - Glenda Chroman, MD  5.Follow-up labs after discharge:  Changes in lipid therapy were made. Check a lipid panel in 8-12 weeks then annually.       Eliseo Gum, PharmD PGY1 Pharmacy Resident   05/29/2022  10:10 AM

## 2022-05-29 NOTE — Progress Notes (Signed)
  Progress Note    05/29/2022 8:03 AM 1 Day Post-Op  Subjective:  Patient is feeling well post-op. She said she is feeling a little anxious, but nothing out of her "normal". She feels good to go home. She has been up and moving    Vitals:   05/29/22 0500 05/29/22 0549  BP:    Pulse:  (!) 54  Resp: 20 18  Temp:    SpO2:      Physical Exam: Cardiac:  regular  Lungs:  nonlabored Incisions:  R neck incision c/d/I without hematoma Extremities:  bruising of the R groin. L groin intact, no bruising. Moving all extremities equally. Grip strength equal. Abdomen:  extensive bruising of the R side of the abdomen Neuro: intact. No slurred speech, no tongue deviation.  CBC    Component Value Date/Time   WBC 17.7 (H) 05/29/2022 0058   RBC 3.49 (L) 05/29/2022 0058   HGB 10.3 (L) 05/29/2022 0058   HCT 30.5 (L) 05/29/2022 0058   PLT 239 05/29/2022 0058   MCV 87.4 05/29/2022 0058   MCH 29.5 05/29/2022 0058   MCHC 33.8 05/29/2022 0058   RDW 13.9 05/29/2022 0058   LYMPHSABS 3.5 05/22/2022 0540   MONOABS 0.9 05/22/2022 0540   EOSABS 0.2 05/22/2022 0540   BASOSABS 0.1 05/22/2022 0540    BMET    Component Value Date/Time   NA 139 05/29/2022 0058   K 3.7 05/29/2022 0058   CL 111 05/29/2022 0058   CO2 23 05/29/2022 0058   GLUCOSE 123 (H) 05/29/2022 0058   BUN 9 05/29/2022 0058   CREATININE 0.62 05/29/2022 0058   CALCIUM 8.5 (L) 05/29/2022 0058   GFRNONAA >60 05/29/2022 0058   GFRAA  09/01/2009 1740    >60        The eGFR has been calculated using the MDRD equation. This calculation has not been validated in all clinical situations. eGFR's persistently <60 mL/min signify possible Chronic Kidney Disease.    INR    Component Value Date/Time   INR 0.9 05/28/2022 0623     Intake/Output Summary (Last 24 hours) at 05/29/2022 0803 Last data filed at 05/29/2022 8756 Gross per 24 hour  Intake 2490 ml  Output 1020 ml  Net 1470 ml     Assessment/Plan:  61 y.o. female is 1  day post op, s/p: R TCAR for symptomatic RICA stenosis   - Ultrasound of R groin on 7/6 revealed no pseudoaneurysm, AVF, or DVT. - R sided neck incision c/d/l without hematoma - L groin nontender without hematoma. - Some leukocytosis, likely reactive due to surgery. Patient looks well and is afebrile - Patient is neuro intact. No speech or swallowing difficulties. Patient is up and moving well. - Continue ASA/plavix/statin - Will place orders for discharge today    Vicente Serene, PA-C [facility] [phone] 05/29/2022 8:03 AM

## 2022-05-29 NOTE — Plan of Care (Signed)
DISCHARGE NOTE HOME Diamond Adams to be discharged home per MD order. Discussed prescriptions and follow up appointments with the patient. Prescriptions given to patient; medication list explained in detail. Patient verbalized understanding.  Skin clean, dry and intact without evidence of skin break down, no evidence of skin tears noted. IV catheter discontinued intact. Site without signs and symptoms of complications. Dressing and pressure applied. Pt denies pain at the site currently. No complaints noted.  Patient free of lines, drains, and wounds.   An After Visit Summary (AVS) was printed and given to the patient. Patient escorted via wheelchair, and discharged home via private auto.  Stephan Minister, RN

## 2022-06-01 NOTE — Discharge Summary (Signed)
Discharge Summary     Diamond Adams Nov 20, 1961 61 y.o. female  [id number]  Admission Date: 05/28/2022  Discharge Date: 7/72023  Physician: Orlie Pollen MD  Admission Diagnosis: Carotid artery stenosis [I65.29] Carotid artery stenosis, symptomatic, right [I65.21]  Discharge Day Diagnosis: Carotid artery stenosis [I65.29] Carotid artery stenosis, symptomatic, right [facility]  Hospital Course:  The patient was admitted to the hospital and taken to the operating room on 05/28/2022 and underwent right transcarotid artery revascularization. This was for symptomatic right internal carotid artery stenosis. The pt tolerated the procedure well and was transported to the PACU in good condition.  In the evening of 05/28/2022, the patient got an ultrasound of the right lower extremity for extensive bruising of the right thigh. The imaging ruled out DVT, pseudoaneurysm, and AV fistula. The bruising was likely from a small hematoma collection, no interventions required. Is expected to resolve by itself.  By POD 1, the pt was neuro intact. Overnight, the patient experienced minimal hypotensive episodes that were asymptomatic and did not require medication. The patient became normotensive and maintained stable blood pressure before discharge.  The remainder of the hospital course consisted of increasing mobilization and increasing intake of solids without difficulty.  The patient was discharged home on POD 1 without issue.   No results for input(s): "NA", "K", "CL", "CO2", "GLUCOSE", "BUN", "CALCIUM" in the last 72 hours.  Invalid input(s): "CRATININE" No results for input(s): "WBC", "HGB", "HCT", "PLT" in the last 72 hours. No results for input(s): "INR" in the last 72 hours.   Discharge Instructions     Call MD for:  redness, tenderness, or signs of infection (pain, swelling, redness, odor or green/yellow discharge around incision site)   Complete by: As directed    Call MD for:  severe  uncontrolled pain   Complete by: As directed    Call MD for:  temperature >100.4   Complete by: As directed    Diet - low sodium heart healthy   Complete by: As directed    Discharge wound care:   Complete by: As directed    Keep clean and dry. Can clean with mild soap and warm water   Increase activity slowly   Complete by: As directed        Discharge Diagnosis:  Carotid artery stenosis [I65.29] Carotid artery stenosis, symptomatic, right [I65.21]  Secondary Diagnosis: Patient Active Problem List   Diagnosis Date Noted   Carotid artery stenosis, symptomatic, right 05/28/2022   Anxiety state    Bruising 05/21/2022   Acute right MCA stroke ([place]) 05/21/2022   Essential hypertension 05/20/2022   Generalized weakness 05/18/2022   Carotid artery stenosis 05/18/2022   Hypokalemia 05/18/2022   Depression 05/18/2022   HLD (hyperlipidemia) 05/18/2022   Acute ischemic right MCA stroke ([place]) 05/18/2022   Tobacco use disorder 05/18/2022   Leukocytosis 05/18/2022   Past Medical History:  Diagnosis Date   Anxiety disorder    Depression    Fibromyalgia    GERD (gastroesophageal reflux disease)    Herpes    Hyperlipidemia    Hypertension    PONV (postoperative nausea and vomiting)    Stroke ([place]) 05/17/2022    Allergies as of 05/29/2022       Reactions   Trazodone And Nefazodone Hives        Medication List     TAKE these medications    acetaminophen 325 MG tablet Commonly known as: TYLENOL Take 1-2 tablets (325-650 mg total) by mouth every 4 (four) hours as  needed for mild pain.   ALPRAZolam 0.25 MG tablet Commonly known as: XANAX Take 1 tablet (0.25 mg total) by mouth at bedtime as needed for anxiety.   aspirin EC 325 MG tablet Take 1 tablet (325 mg total) by mouth daily.   clopidogrel 75 MG tablet Commonly known as: PLAVIX Take 1 tablet (75 mg total) by mouth daily.   FLUoxetine 10 MG capsule Commonly known as: PROZAC Take 1 capsule (10 mg total) by  mouth daily.   magnesium gluconate 500 MG tablet Commonly known as: MAGONATE Take 0.5 tablets (250 mg total) by mouth at bedtime.   meclizine 25 MG tablet Commonly known as: ANTIVERT Take 25 mg by mouth 2 (two) times daily as needed for dizziness.   oxyCODONE-acetaminophen 5-325 MG tablet Commonly known as: PERCOCET/ROXICET Take 1 tablet by mouth every 6 (six) hours as needed for moderate pain.   pantoprazole 40 MG tablet Commonly known as: PROTONIX Take 1 tablet (40 mg total) by mouth daily as needed (reflux).   rosuvastatin 20 MG tablet Commonly known as: CRESTOR Take 1 tablet (20 mg total) by mouth daily.               Discharge Care Instructions  (From admission, onward)           Start     Ordered   05/29/22 0000  Discharge wound care:       Comments: Keep clean and dry. Can clean with mild soap and warm water   05/29/22 0954             Discharge Instructions:   [facility] Discharge Instructions Carotid Endarterectomy (CEA)  Please refer to the following instructions for your post-procedure care. Your surgeon or physician assistant will discuss any changes with you.  Activity  You are encouraged to walk as much as you can. You can slowly return to normal activities but must avoid strenuous activity and heavy lifting until your doctor tell you it's OK. Avoid activities such as vacuuming or swinging a golf club. You can drive after one week if you are comfortable and you are no longer taking prescription pain medications. It is normal to feel tired for serval weeks after your surgery. It is also normal to have difficulty with sleep habits, eating, and bowel movements after surgery. These will go away with time.  Bathing/Showering  You may shower after you come home. Do not soak in a bathtub, hot tub, or swim until the incision heals completely.  Incision Care  Shower every day. Clean your incision with mild soap  and water. Pat the area dry with a clean towel. You do not need a bandage unless otherwise instructed. Do not apply any ointments or creams to your incision. You may have skin glue on your incision. Do not peel it off. It will come off on its own in about one week. Your incision may feel thickened and raised for several weeks after your surgery. This is normal and the skin will soften over time. For Men Only: It's OK to shave around the incision but do not shave the incision itself for 2 weeks. It is common to have numbness under your chin that could last for several months.  Diet  Resume your normal diet. There are no special food restrictions following this procedure. A low fat/low cholesterol diet is recommended for all patients with vascular disease. In order to heal from your surgery, it is CRITICAL to get adequate nutrition.  Your body requires vitamins, minerals, and protein. Vegetables are the best source of vitamins and minerals. Vegetables also provide the perfect balance of protein. Processed food has little nutritional value, so try to avoid this.  Medications  Resume taking all of your medications unless your doctor or physician assistant tells you not to.  If your incision is causing pain, you may take over-the- counter pain relievers such as acetaminophen (Tylenol). If you were prescribed a stronger pain medication, please be aware these medications can cause nausea and constipation.  Prevent nausea by taking the medication with a snack or meal. Avoid constipation by drinking plenty of fluids and eating foods with a high amount of fiber, such as fruits, vegetables, and grains. Do not take Tylenol if you are taking prescription pain medications.  Follow Up  Our office will schedule a follow up appointment 2-3 weeks following discharge.  Please call us immediately for any of the following conditions  Increased pain, redness, drainage (pus) from your incision site. Fever of 101 degrees or  higher. If you should develop stroke (slurred speech, difficulty swallowing, weakness on one side of your body, loss of vision) you should call 911 and go to the nearest emergency room.  Reduce your risk of vascular disease:  Stop smoking. If you would like help call [facility] at 1-800-QUIT-NOW [phone]) or [facility] at [phone]. Manage your cholesterol Maintain a desired weight Control your diabetes Keep your blood pressure down  If you have any questions, please call the office at [phone].   Disposition: Home  Patient's condition: is Excellent  Follow up: 1. [facility] in 3-4 weeks    Vicente Serene, PA-C [facility] [phone]   --- For [facility] use ---   Modified Rankin score at D/C (0-6): 0  IV medication needed for:  1. Hypertension: No 2. Hypotension: No  Post-op Complications: No  1. Post-op CVA or TIA: No  2. CN injury: No  3. Myocardial infarction: No  4.  CHF: No  5.  Dysrhythmia (new): No  6. Wound infection: No  7. Reperfusion symptoms: No  8. Return to OR: No  Discharge medications: Statin use:  Yes ASA use:  Yes   Beta blocker use:  No ACE-Inhibitor use:  No  ARB use:  No CCB use: No P2Y12 Antagonist use: Yes, [ x] Plavix, '[ ]'$  Plasugrel, '[ ]'$  Ticlopinine, '[ ]'$  Ticagrelor, '[ ]'$  Other, '[ ]'$  No for medical reason, '[ ]'$  Non-compliant, '[ ]'$  Not-indicated Anti-coagulant use:  No, '[ ]'$  Warfarin, '[ ]'$  Rivaroxaban, '[ ]'$  Dabigatran,

## 2022-06-02 DIAGNOSIS — Z09 Encounter for follow-up examination after completed treatment for conditions other than malignant neoplasm: Secondary | ICD-10-CM | POA: Diagnosis not present

## 2022-06-02 DIAGNOSIS — I10 Essential (primary) hypertension: Secondary | ICD-10-CM | POA: Diagnosis not present

## 2022-06-02 DIAGNOSIS — I779 Disorder of arteries and arterioles, unspecified: Secondary | ICD-10-CM | POA: Diagnosis not present

## 2022-06-02 DIAGNOSIS — Z299 Encounter for prophylactic measures, unspecified: Secondary | ICD-10-CM | POA: Diagnosis not present

## 2022-06-02 DIAGNOSIS — D692 Other nonthrombocytopenic purpura: Secondary | ICD-10-CM | POA: Diagnosis not present

## 2022-06-12 ENCOUNTER — Other Ambulatory Visit: Payer: Self-pay | Admitting: *Deleted

## 2022-06-12 DIAGNOSIS — I6523 Occlusion and stenosis of bilateral carotid arteries: Secondary | ICD-10-CM

## 2022-06-18 ENCOUNTER — Emergency Department (HOSPITAL_COMMUNITY)
Admission: EM | Admit: 2022-06-18 | Discharge: 2022-06-19 | Disposition: A | Discharge status: Home / Self Care | Payer: Medicare Other | Attending: Emergency Medicine | Admitting: Emergency Medicine

## 2022-06-18 ENCOUNTER — Emergency Department (HOSPITAL_COMMUNITY): Payer: Medicare Other

## 2022-06-18 ENCOUNTER — Encounter (HOSPITAL_COMMUNITY): Payer: Self-pay

## 2022-06-18 ENCOUNTER — Other Ambulatory Visit: Payer: Self-pay

## 2022-06-18 DIAGNOSIS — I10 Essential (primary) hypertension: Secondary | ICD-10-CM | POA: Diagnosis not present

## 2022-06-18 DIAGNOSIS — M6281 Muscle weakness (generalized): Secondary | ICD-10-CM | POA: Insufficient documentation

## 2022-06-18 DIAGNOSIS — R202 Paresthesia of skin: Secondary | ICD-10-CM | POA: Diagnosis not present

## 2022-06-18 DIAGNOSIS — Z743 Need for continuous supervision: Secondary | ICD-10-CM | POA: Diagnosis not present

## 2022-06-18 DIAGNOSIS — I160 Hypertensive urgency: Secondary | ICD-10-CM | POA: Diagnosis not present

## 2022-06-18 DIAGNOSIS — R0689 Other abnormalities of breathing: Secondary | ICD-10-CM | POA: Diagnosis not present

## 2022-06-18 DIAGNOSIS — M47812 Spondylosis without myelopathy or radiculopathy, cervical region: Secondary | ICD-10-CM | POA: Diagnosis not present

## 2022-06-18 DIAGNOSIS — R519 Headache, unspecified: Secondary | ICD-10-CM | POA: Diagnosis not present

## 2022-06-18 DIAGNOSIS — R531 Weakness: Secondary | ICD-10-CM | POA: Diagnosis not present

## 2022-06-18 DIAGNOSIS — I6523 Occlusion and stenosis of bilateral carotid arteries: Secondary | ICD-10-CM | POA: Diagnosis not present

## 2022-06-18 LAB — CBC
HCT: 38.1 % (ref 36.0–46.0)
Hemoglobin: 13.1 g/dL (ref 12.0–15.0)
MCH: 29.6 pg (ref 26.0–34.0)
MCHC: 34.4 g/dL (ref 30.0–36.0)
MCV: 86.2 fL (ref 80.0–100.0)
Platelets: 254 10*3/uL (ref 150–400)
RBC: 4.42 MIL/uL (ref 3.87–5.11)
RDW: 13.6 % (ref 11.5–15.5)
WBC: 9.4 10*3/uL (ref 4.0–10.5)
nRBC: 0 % (ref 0.0–0.2)

## 2022-06-18 LAB — URINALYSIS, ROUTINE W REFLEX MICROSCOPIC
Bilirubin Urine: NEGATIVE
Glucose, UA: NEGATIVE mg/dL
Ketones, ur: NEGATIVE mg/dL
Leukocytes,Ua: NEGATIVE
Nitrite: NEGATIVE
Protein, ur: NEGATIVE mg/dL
Specific Gravity, Urine: 1.004 — ABNORMAL LOW (ref 1.005–1.030)
pH: 7 (ref 5.0–8.0)

## 2022-06-18 LAB — BASIC METABOLIC PANEL
Anion gap: 8 (ref 5–15)
BUN: 11 mg/dL (ref 6–20)
CO2: 25 mmol/L (ref 22–32)
Calcium: 9 mg/dL (ref 8.9–10.3)
Chloride: 111 mmol/L (ref 98–111)
Creatinine, Ser: 0.77 mg/dL (ref 0.44–1.00)
GFR, Estimated: >60 mL/min (ref >60)
Glucose, Bld: 119 mg/dL — ABNORMAL HIGH (ref 70–99)
Potassium: 3.4 mmol/L — ABNORMAL LOW (ref 3.5–5.1)
Sodium: 144 mmol/L (ref 135–145)

## 2022-06-18 LAB — TROPONIN I (HIGH SENSITIVITY)
Troponin I (High Sensitivity): 3 ng/L (ref <18)
Troponin I (High Sensitivity): 5 ng/L (ref <18)

## 2022-06-18 MED ORDER — HYDRALAZINE HCL 20 MG/ML IJ SOLN
5.0000 mg | Freq: Once | INTRAMUSCULAR | Status: AC
  Administered 2022-06-18: 5 mg via INTRAVENOUS
  Filled 2022-06-18: qty 1

## 2022-06-18 MED ORDER — IOHEXOL 350 MG/ML SOLN
75.0000 mL | Freq: Once | INTRAVENOUS | Status: AC | PRN
  Administered 2022-06-18: 75 mL via INTRAVENOUS

## 2022-06-18 MED ORDER — METOPROLOL TARTRATE 5 MG/5ML IV SOLN
5.0000 mg | Freq: Once | INTRAVENOUS | Status: AC
  Administered 2022-06-18: 5 mg via INTRAVENOUS
  Filled 2022-06-18: qty 5

## 2022-06-18 NOTE — ED Provider Notes (Signed)
[facility] Provider Note   CSN: [id number] Arrival date & time: 06/18/22  1902     History  No chief complaint on file.   Diamond Adams is a 61 y.o. female.  Patient is a 61 year old female presenting to emergency department for generalized weakness.  Significant past medical history for ED presentation on 05/18/2022 for generalized weakness and carotid artery dissection found on imaging.  Patient then had  underwent right transcarotid artery revascularization on 05/28/2022. This was for symptomatic right internal carotid artery stenosis.  Patient presenting for new generalized weakness and headache that began today.  Patient states the symptoms are similar to her carotid artery dissection presentation.  Patient hypertensive on arrival at 190/64.  States she used to take blood pressure medications but was stopped after her last admission.  Highest home blood pressure reading 207/97.  Blood pressure on arrival 190/64.  Patient otherwise denies any recent illnesses.  Denies any chest pain or shortness of breath.  The history is provided by the patient. No language interpreter was used.       Home Medications Prior to Admission medications   Medication Sig Start Date End Date Taking? Authorizing Provider  acetaminophen (TYLENOL) 325 MG tablet Take 1-2 tablets (325-650 mg total) by mouth every 4 (four) hours as needed for mild pain. 05/26/22   Setzer, Edman Circle, PA-C  ALPRAZolam Duanne Moron) 0.25 MG tablet Take 1 tablet (0.25 mg total) by mouth at bedtime as needed for anxiety. 05/26/22   Setzer, Edman Circle, PA-C  aspirin EC 325 MG tablet Take 1 tablet (325 mg total) by mouth daily. 05/26/22 07/25/22  Setzer, Edman Circle, PA-C  clopidogrel (PLAVIX) 75 MG tablet Take 1 tablet (75 mg total) by mouth daily. 05/26/22 07/25/22  Setzer, Edman Circle, PA-C  FLUoxetine (PROZAC) 10 MG capsule Take 1 capsule (10 mg total) by mouth daily. 05/26/22   Setzer, Edman Circle, PA-C  magnesium gluconate (MAGONATE) 500 MG  tablet Take 0.5 tablets (250 mg total) by mouth at bedtime. 05/26/22   Setzer, Edman Circle, PA-C  meclizine (ANTIVERT) 25 MG tablet Take 25 mg by mouth 2 (two) times daily as needed for dizziness. 02/23/22   [provider]  oxyCODONE-acetaminophen (PERCOCET/ROXICET) 5-325 MG tablet Take 1 tablet by mouth every 6 (six) hours as needed for moderate pain. 05/29/22   Schuh, McKenzi P, PA-C  pantoprazole (PROTONIX) 40 MG tablet Take 1 tablet (40 mg total) by mouth daily as needed (reflux). 05/26/22   Setzer, Edman Circle, PA-C  rosuvastatin (CRESTOR) 20 MG tablet Take 1 tablet (20 mg total) by mouth daily. 05/26/22 07/25/22  Setzer, Edman Circle, PA-C      Allergies    Trazodone and nefazodone    Review of Systems   Review of Systems  Constitutional:  Negative for chills and fever.  HENT:  Negative for ear pain and sore throat.   Eyes:  Negative for pain and visual disturbance.  Respiratory:  Negative for cough and shortness of breath.   Cardiovascular:  Negative for chest pain and palpitations.  Gastrointestinal:  Negative for abdominal pain and vomiting.  Genitourinary:  Negative for dysuria and hematuria.  Musculoskeletal:  Negative for arthralgias and back pain.  Skin:  Negative for color change and rash.  Neurological:  Positive for weakness. Negative for seizures and syncope.  All other systems reviewed and are negative.   Physical Exam Updated Vital Signs BP (!) 189/62   Pulse (!) 52   Temp 98.1 F (36.7 C)  Resp 17   Ht '4\' 10"'$  (1.473 m)   Wt 64.9 kg   SpO2 98%   BMI 29.89 kg/m  Physical Exam Vitals and nursing note reviewed.  Constitutional:      General: She is not in acute distress.    Appearance: She is well-developed.  HENT:     Head: Normocephalic and atraumatic.  Eyes:     Conjunctiva/sclera: Conjunctivae normal.  Cardiovascular:     Rate and Rhythm: Normal rate and regular rhythm.     Heart sounds: No murmur heard. Pulmonary:     Effort: Pulmonary effort is normal. No  respiratory distress.     Breath sounds: Normal breath sounds.  Abdominal:     Palpations: Abdomen is soft.     Tenderness: There is no abdominal tenderness.  Musculoskeletal:        General: No swelling.     Cervical back: Neck supple.  Skin:    General: Skin is warm and dry.     Capillary Refill: Capillary refill takes less than 2 seconds.  Neurological:     Mental Status: She is alert and oriented to person, place, and time.     GCS: GCS eye subscore is 4. GCS verbal subscore is 5. GCS motor subscore is 6.     Sensory: Sensation is intact.     Motor: Motor function is intact.  Psychiatric:        Mood and Affect: Mood normal.     ED Results / Procedures / Treatments   Labs (all labs ordered are listed, but only abnormal results are displayed) Labs Reviewed  BASIC METABOLIC PANEL - Abnormal; Notable for the following components:      Result Value   Potassium 3.4 (*)    Glucose, Bld 119 (*)    All other components within normal limits  URINALYSIS, ROUTINE W REFLEX MICROSCOPIC - Abnormal; Notable for the following components:   Color, Urine COLORLESS (*)    Specific Gravity, Urine 1.004 (*)    Hgb urine dipstick SMALL (*)    Bacteria, UA RARE (*)    All other components within normal limits  CBC  TROPONIN I (HIGH SENSITIVITY)  TROPONIN I (HIGH SENSITIVITY)    EKG None  Radiology DG Chest Portable 1 View  Result Date: 06/18/2022 CLINICAL DATA:  Weakness and hypertension EXAM: PORTABLE CHEST 1 VIEW COMPARISON:  05/17/2022 FINDINGS: The heart size and mediastinal contours are within normal limits. Both lungs are clear. The visualized skeletal structures are unremarkable. IMPRESSION: No active disease. Electronically Signed   By: Lucienne Capers M.D.   On: 06/18/2022 22:25    Procedures Procedures    Medications Ordered in ED Medications  hydrALAZINE (APRESOLINE) injection 10 mg (has no administration in time range)  lisinopril (ZESTRIL) tablet 20 mg (has no  administration in time range)  metoprolol tartrate (LOPRESSOR) injection 5 mg (5 mg Intravenous Given 06/18/22 2215)  hydrALAZINE (APRESOLINE) injection 5 mg (5 mg Intravenous Given 06/18/22 2332)  iohexol (OMNIPAQUE) 350 MG/ML injection 75 mL (75 mLs Intravenous Contrast Given 06/18/22 2307)    ED Course/ Medical Decision Making/ A&P                           Medical Decision Making Amount and/or Complexity of Data Reviewed Labs: ordered. Radiology: ordered. ECG/medicine tests: ordered.  Risk Prescription drug management.   74:39 AM 61 year old female presenting to emergency department for generalized weakness.  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  Patient then had  underwent right transcarotid artery revascularization on 05/28/2022. This was for symptomatic right internal carotid artery stenosis.   Alert oriented x3, no acute distress, afebrile, stable vital signs.  No strokelike symptoms on exam.    CT head pending.  CTA head and neck ending.  Patient has a stable EKG demonstrating sinus rhythm with a rate of 65 bpm.  No ST segment elevation or depression.  Normal axis.  No T wave inversions.  Stable intervals.  Troponin within normal limits.  Stable electrolytes.  12:58 AM CTA head and neck demonstrates no dissection.  Symptoms likely secondary to hypertensive urgency.  Blood pressure medication given and patient safe for discharge at this time with close follow-up with cardiology  Patient in no distress and overall condition improved here in the ED. Detailed discussions were had with the patient regarding current findings, and need for close f/u with PCP or on call doctor. The patient has been instructed to return immediately if the symptoms worsen in any way for re-evaluation. Patient verbalized understanding and is in agreement with current care plan. All questions answered prior to  discharge.         Final Clinical Impression(s) / ED Diagnoses Final diagnoses:  Generalized weakness  Hypertensive urgency    Rx / DC Orders ED Discharge Orders     None         Lianne Cure, DO 39/[id number]

## 2022-06-18 NOTE — ED Notes (Signed)
Patient transported to CT 

## 2022-06-18 NOTE — ED Triage Notes (Addendum)
Pt presents with hypertension today with highest BP being 201/97. Pt had a stent placed on July 6th and was told to not take BP meds after procedure. Pt now complaining of weakness and and headache. Denies any chest pain

## 2022-06-19 ENCOUNTER — Telehealth (HOSPITAL_COMMUNITY): Payer: Self-pay | Admitting: Emergency Medicine

## 2022-06-19 DIAGNOSIS — Z299 Encounter for prophylactic measures, unspecified: Secondary | ICD-10-CM | POA: Diagnosis not present

## 2022-06-19 DIAGNOSIS — J449 Chronic obstructive pulmonary disease, unspecified: Secondary | ICD-10-CM | POA: Diagnosis not present

## 2022-06-19 DIAGNOSIS — I10 Essential (primary) hypertension: Secondary | ICD-10-CM | POA: Diagnosis not present

## 2022-06-19 MED ORDER — LISINOPRIL 20 MG PO TABS: 20.0000 mg | ORAL_TABLET | Freq: Every day | ORAL | 1 refills | Status: DC

## 2022-06-19 MED ORDER — LISINOPRIL 10 MG PO TABS
20.0000 mg | ORAL_TABLET | Freq: Every day | ORAL | Status: DC
  Administered 2022-06-19: 20 mg via ORAL
  Filled 2022-06-19: qty 2

## 2022-06-19 MED ORDER — HYDRALAZINE HCL 20 MG/ML IJ SOLN
10.0000 mg | Freq: Once | INTRAMUSCULAR | Status: AC
  Administered 2022-06-19: 10 mg via INTRAVENOUS
  Filled 2022-06-19: qty 1

## 2022-06-19 NOTE — Discharge Instructions (Addendum)
Lisinopril 20 mg daily sent to pharmacy.    Follow up with your primary care physician or your cardiologist regarding further management of elevated blood pressures

## 2022-06-19 NOTE — Telephone Encounter (Signed)
Medication sent to pharmacy, lisinopril 20 mg daily Per ED provider note

## 2022-06-22 DIAGNOSIS — E785 Hyperlipidemia, unspecified: Secondary | ICD-10-CM | POA: Diagnosis not present

## 2022-06-22 DIAGNOSIS — G4700 Insomnia, unspecified: Secondary | ICD-10-CM | POA: Diagnosis not present

## 2022-06-22 DIAGNOSIS — K219 Gastro-esophageal reflux disease without esophagitis: Secondary | ICD-10-CM | POA: Diagnosis not present

## 2022-06-22 DIAGNOSIS — Z7689 Persons encountering health services in other specified circumstances: Secondary | ICD-10-CM | POA: Diagnosis not present

## 2022-06-22 DIAGNOSIS — Z8673 Personal history of transient ischemic attack (TIA), and cerebral infarction without residual deficits: Secondary | ICD-10-CM | POA: Diagnosis not present

## 2022-06-22 DIAGNOSIS — I10 Essential (primary) hypertension: Secondary | ICD-10-CM | POA: Diagnosis not present

## 2022-06-22 DIAGNOSIS — Z139 Encounter for screening, unspecified: Secondary | ICD-10-CM | POA: Diagnosis not present

## 2022-06-22 DIAGNOSIS — I739 Peripheral vascular disease, unspecified: Secondary | ICD-10-CM | POA: Diagnosis not present

## 2022-06-22 DIAGNOSIS — I6529 Occlusion and stenosis of unspecified carotid artery: Secondary | ICD-10-CM | POA: Diagnosis not present

## 2022-06-25 DIAGNOSIS — E559 Vitamin D deficiency, unspecified: Secondary | ICD-10-CM | POA: Diagnosis not present

## 2022-06-25 DIAGNOSIS — E785 Hyperlipidemia, unspecified: Secondary | ICD-10-CM | POA: Diagnosis not present

## 2022-06-25 DIAGNOSIS — R7301 Impaired fasting glucose: Secondary | ICD-10-CM | POA: Diagnosis not present

## 2022-06-25 DIAGNOSIS — Z139 Encounter for screening, unspecified: Secondary | ICD-10-CM | POA: Diagnosis not present

## 2022-06-25 DIAGNOSIS — I10 Essential (primary) hypertension: Secondary | ICD-10-CM | POA: Diagnosis not present

## 2022-07-01 ENCOUNTER — Encounter (INDEPENDENT_AMBULATORY_CARE_PROVIDER_SITE_OTHER): Payer: Self-pay

## 2022-07-01 DIAGNOSIS — Z8673 Personal history of transient ischemic attack (TIA), and cerebral infarction without residual deficits: Secondary | ICD-10-CM | POA: Diagnosis not present

## 2022-07-01 DIAGNOSIS — G4700 Insomnia, unspecified: Secondary | ICD-10-CM | POA: Diagnosis not present

## 2022-07-01 DIAGNOSIS — Z87891 Personal history of nicotine dependence: Secondary | ICD-10-CM | POA: Diagnosis not present

## 2022-07-01 DIAGNOSIS — I10 Essential (primary) hypertension: Secondary | ICD-10-CM | POA: Diagnosis not present

## 2022-07-01 DIAGNOSIS — K219 Gastro-esophageal reflux disease without esophagitis: Secondary | ICD-10-CM | POA: Diagnosis not present

## 2022-07-01 DIAGNOSIS — E785 Hyperlipidemia, unspecified: Secondary | ICD-10-CM | POA: Diagnosis not present

## 2022-07-01 DIAGNOSIS — I6529 Occlusion and stenosis of unspecified carotid artery: Secondary | ICD-10-CM | POA: Diagnosis not present

## 2022-07-01 DIAGNOSIS — I739 Peripheral vascular disease, unspecified: Secondary | ICD-10-CM | POA: Diagnosis not present

## 2022-07-03 ENCOUNTER — Ambulatory Visit (INDEPENDENT_AMBULATORY_CARE_PROVIDER_SITE_OTHER): Payer: Medicare Other | Admitting: Physician Assistant

## 2022-07-03 ENCOUNTER — Ambulatory Visit (HOSPITAL_COMMUNITY)
Admission: RE | Admit: 2022-07-03 | Discharge: 2022-07-03 | Disposition: A | Discharge status: Home / Self Care | Payer: Medicare Other | Source: Ambulatory Visit | Attending: Vascular Surgery | Admitting: Vascular Surgery

## 2022-07-03 ENCOUNTER — Other Ambulatory Visit: Payer: Self-pay

## 2022-07-03 VITALS — BP 169/73 | HR 46 | Temp 97.6°F | Resp 14 | Ht <= 58 in | Wt 144.0 lb

## 2022-07-03 DIAGNOSIS — I6523 Occlusion and stenosis of bilateral carotid arteries: Secondary | ICD-10-CM

## 2022-07-03 NOTE — Progress Notes (Signed)
POST OPERATIVE OFFICE NOTE    CC:  F/u for surgery  HPI:  This is a 61 y.o. female who is s/p right TCAR for symptomatic ICA stenosis.  She was recently discharged from physical medicine and rehab after suffering a stroke. on 05/28/22 by Dr. Virl Cagey.    Pt returns today for follow up.  Pt states she is back to her baseline without weakness in her extremities.  She has stopped smoking since her hospitalization.  She has started a walking program and plans to increase her walking.   She is medically managed on ASA, Plavix and a daily Statin.    Allergies  Allergen Reactions   Trazodone And Nefazodone Hives    Current Outpatient Medications  Medication Sig Dispense Refill   ALPRAZolam (XANAX) 0.25 MG tablet Take 1 tablet (0.25 mg total) by mouth at bedtime as needed for anxiety. 15 tablet 0   amLODipine (NORVASC) 5 MG tablet Take 5 mg by mouth daily.     aspirin EC 325 MG tablet Take 1 tablet (325 mg total) by mouth daily. 30 tablet 0   clopidogrel (PLAVIX) 75 MG tablet Take 1 tablet (75 mg total) by mouth daily. 30 tablet 0   FLUoxetine (PROZAC) 20 MG capsule Take 20 mg by mouth daily.     lisinopril (ZESTRIL) 40 MG tablet Take 40 mg by mouth daily.     pantoprazole (PROTONIX) 40 MG tablet Take 1 tablet (40 mg total) by mouth daily as needed (reflux). 30 tablet 0   rosuvastatin (CRESTOR) 20 MG tablet Take 1 tablet (20 mg total) by mouth daily. 30 tablet 0   acetaminophen (TYLENOL) 325 MG tablet Take 1-2 tablets (325-650 mg total) by mouth every 4 (four) hours as needed for mild pain.     FLUoxetine (PROZAC) 10 MG capsule Take 1 capsule (10 mg total) by mouth daily. (Patient not taking: Reported on 07/03/2022) 30 capsule 0   lisinopril (ZESTRIL) 20 MG tablet Take 1 tablet (20 mg total) by mouth daily. (Patient not taking: Reported on 07/03/2022) 30 tablet 1   magnesium gluconate (MAGONATE) 500 MG tablet Take 0.5 tablets (250 mg total) by mouth at bedtime. (Patient not taking: Reported on  07/03/2022) 30 tablet 0   meclizine (ANTIVERT) 25 MG tablet Take 25 mg by mouth 2 (two) times daily as needed for dizziness.     oxyCODONE-acetaminophen (PERCOCET/ROXICET) 5-325 MG tablet Take 1 tablet by mouth every 6 (six) hours as needed for moderate pain. 15 tablet 0   No current facility-administered medications for this visit.     ROS:  See HPI  Physical Exam:    Incision:  well healed right neck incision Extremities:  Palpable radial pulses B, no facial droop or tomgue deviation Neuro: motor, sensation intact all 4 ext Lungs :  non labored breathing       Right Carotid Findings:  +----------+--------+--------+--------+------------------+--------+            PSV cm/sEDV cm/sStenosisPlaque DescriptionComments  +----------+--------+--------+--------+------------------+--------+  CCA Prox  83      17                                          +----------+--------+--------+--------+------------------+--------+  CCA Mid   67      20                                          +----------+--------+--------+--------+------------------+--------+  CCA Distal75      23                                stent     +----------+--------+--------+--------+------------------+--------+  ICA Prox                                            stent     +----------+--------+--------+--------+------------------+--------+  ICA Mid   80      23                                          +----------+--------+--------+--------+------------------+--------+  ICA Distal81      24                                          +----------+--------+--------+--------+------------------+--------+  ECA       247     33                                          +----------+--------+--------+--------+------------------+--------+   +----------+--------+-------+----------------+-------------------+            PSV cm/sEDV cmsDescribe        Arm Pressure (mmHG)   +----------+--------+-------+----------------+-------------------+  [id number]     1      Multiphasic, [id number]                  +----------+--------+-------+----------------+-------------------+   +---------+--------+--+--------+--+---------+  VertebralPSV cm/s45EDV cm/s10Antegrade  +---------+--------+--+--------+--+---------+       Right Stent(s):  +---------------+--+--++++  Prox to Stent  7324  +---------------+--+--++++  Proximal Stent 6721  +---------------+--+--++++  Mid Stent      8022  +---------------+--+--++++  Distal Stent   8020  +---------------+--+--++++  Distal to Stent8219  +---------------+--+--++++            Left Carotid Findings:  +----------+--------+--------+--------+------------------+--------+            PSV cm/sEDV cm/sStenosisPlaque DescriptionComments  +----------+--------+--------+--------+------------------+--------+  CCA Prox  91      16                                          +----------+--------+--------+--------+------------------+--------+  CCA Mid   81      23                                          +----------+--------+--------+--------+------------------+--------+  CCA Distal83      21              heterogenous                +----------+--------+--------+--------+------------------+--------+  ICA Prox  176     51      40-59%  heterogenous                +----------+--------+--------+--------+------------------+--------+  ICA Mid   102  33                                          +----------+--------+--------+--------+------------------+--------+  ICA Distal86      27                                          +----------+--------+--------+--------+------------------+--------+  ECA       128     22                                          +----------+--------+--------+--------+------------------+--------+    +----------+--------+--------+----------------+-------------------+            PSV cm/sEDV cm/sDescribe        Arm Pressure (mmHG)  +----------+--------+--------+----------------+-------------------+  [id number]     6       Multiphasic, [id number]                  +----------+--------+--------+----------------+-------------------+   +---------+--------+--+--------+--+---------+  VertebralPSV cm/s43EDV cm/s11Antegrade  +---------+--------+--+--------+--+---------+   Summary:  Right Carotid: Patent stent with no evidence for restenosis.   Left Carotid: Velocities in the left ICA are consistent with a 40-59%  stenosis.   Vertebrals:  Bilateral vertebral arteries demonstrate antegrade flow.  Subclavians: Normal flow hemodynamics were seen in bilateral subclavian               arteries.  Assessment/Plan:  This is a 61 y.o. female with Symptomatic right internal carotid artery stenosis who is s/p:right TCAR. Patent stent right ICA, 40-59% stenosis on the left.  She denies new stroke symptoms and back to baseline with regained strength in her extremities.     -Continue Plavix for life, ASA and Stain.  She will f/u in 6 months for repeat carotid duplex surveillance.  If she develops ne w symptoms she will call 911. Cont. Smoking cessation.      Laurence Slate, [facility] [phone]   Clinic MD:  Virl Cagey

## 2022-07-07 ENCOUNTER — Encounter: Payer: Self-pay | Admitting: Adult Health

## 2022-07-07 ENCOUNTER — Ambulatory Visit (INDEPENDENT_AMBULATORY_CARE_PROVIDER_SITE_OTHER): Payer: Medicare Other | Admitting: Adult Health

## 2022-07-07 VITALS — BP 137/61 | HR 55 | Ht <= 58 in | Wt 147.0 lb

## 2022-07-07 DIAGNOSIS — I63511 Cerebral infarction due to unspecified occlusion or stenosis of right middle cerebral artery: Secondary | ICD-10-CM

## 2022-07-07 DIAGNOSIS — E782 Mixed hyperlipidemia: Secondary | ICD-10-CM | POA: Diagnosis not present

## 2022-07-07 DIAGNOSIS — I10 Essential (primary) hypertension: Secondary | ICD-10-CM

## 2022-07-07 NOTE — Progress Notes (Signed)
PATIENT: Diamond Adams DOB: 1961/08/26  REASON FOR VISIT: follow up HISTORY FROM: patient PRIMARY NEUROLOGIST: Dr. Leonie Man   Chief Complaint  Patient presents with   Follow-up    Pt in 20 with daughter  Pt here for stroke f/u Pt states increased weakness when walking      HISTORY OF PRESENT ILLNESS: Today 07/07/22:  Diamond Adams is a 61 year old female with a history of stroke.  She went to the hospital in June with weakness dizziness and blurred vision.  She was found to have right MCA scattered infarcts.  She also had high-grade stenosis and then right ICA.  Beginning of July she had intervention.  She will now remains on aspirin and Plavix per [facility].  Reports that she went to the hospital at the end of July for headache dizziness and weakness- thought to be due to BP.   Having some anixety d/t to fear of having a another stroke.   Feeling fatigued since the stroke but no other deficits.    No longer smoking cigarettes.  HISTORY Diamond Adams is a 61 y.o. female with history of hypertension, smoker admitted for generalized weakness for 2 weeks, acute onset left-sided weakness, dizziness and blurred vision. No tPA given due to outside window.     Stroke:  right MCA scattered infarcts, largest at right frontal parietal region, likely artery to artery embolism  from right ICA high-grade stenosis versus occlusion CT head 6/3 no acute finding CT head 6/26 right frontal parietal subacute infarct CT head and neck right ICA high-grade stenosis versus occlusion, concerning for dissection MRI right MCA scattered infarcts largest at the right frontoparietal Carotid Doppler right ICA 80 to 99% stenosis, left ICA 60 to 79% stenosis. Cerebral angio right ICA 90% severe stenosis 2D Echo EF 60 to 65% LDL 83 HgbA1c 5.4 UDS negative ESR 32 CRP 2.6 Heparin subcu for VTE prophylaxis No antithrombotic prior to admission, now on aspirin 325 mg daily and clopidogrel 75 mg daily. Further  regimen per [facility] Patient counseled to be compliant with her antithrombotic medications Ongoing aggressive stroke risk factor management  REVIEW OF SYSTEMS: Out of a complete 14 system review of symptoms, the patient complains only of the following symptoms, and all other reviewed systems are negative.  ALLERGIES: Allergies  Allergen Reactions   Trazodone And Nefazodone Hives    HOME MEDICATIONS: Outpatient Medications Prior to Visit  Medication Sig Dispense Refill   ALPRAZolam (XANAX) 0.25 MG tablet Take 1 tablet (0.25 mg total) by mouth at bedtime as needed for anxiety. 15 tablet 0   amLODipine (NORVASC) 5 MG tablet Take 5 mg by mouth daily.     aspirin EC 325 MG tablet Take 1 tablet (325 mg total) by mouth daily. 30 tablet 0   clopidogrel (PLAVIX) 75 MG tablet Take 1 tablet (75 mg total) by mouth daily. 30 tablet 0   FLUoxetine (PROZAC) 20 MG capsule Take 20 mg by mouth daily.     lisinopril (ZESTRIL) 40 MG tablet Take 40 mg by mouth daily.     pantoprazole (PROTONIX) 40 MG tablet Take 1 tablet (40 mg total) by mouth daily as needed (reflux). 30 tablet 0   rosuvastatin (CRESTOR) 20 MG tablet Take 1 tablet (20 mg total) by mouth daily. 30 tablet 0   acetaminophen (TYLENOL) 325 MG tablet Take 1-2 tablets (325-650 mg total) by mouth every 4 (four) hours as needed for mild pain.     FLUoxetine (PROZAC) 10  MG capsule Take 1 capsule (10 mg total) by mouth daily. 30 capsule 0   lisinopril (ZESTRIL) 20 MG tablet Take 1 tablet (20 mg total) by mouth daily. 30 tablet 1   magnesium gluconate (MAGONATE) 500 MG tablet Take 0.5 tablets (250 mg total) by mouth at bedtime. (Patient not taking: Reported on 07/03/2022) 30 tablet 0   meclizine (ANTIVERT) 25 MG tablet Take 25 mg by mouth 2 (two) times daily as needed for dizziness.     oxyCODONE-acetaminophen (PERCOCET/ROXICET) 5-325 MG tablet Take 1 tablet by mouth every 6 (six) hours as needed for moderate pain. 15 tablet 0   No facility-administered  medications prior to visit.    PAST MEDICAL HISTORY: Past Medical History:  Diagnosis Date   Anxiety disorder    Depression    Fibromyalgia    GERD (gastroesophageal reflux disease)    Herpes    Hyperlipidemia    Hypertension    PONV (postoperative nausea and vomiting)    Stroke ([place]) 05/17/2022    PAST SURGICAL HISTORY: Past Surgical History:  Procedure Laterality Date   ABDOMINAL HYSTERECTOMY  1994   AORTIC ARCH ANGIOGRAPHY N/A 05/20/2022   Procedure: AORTIC ARCH ANGIOGRAPHY;  Surgeon: Broadus John, MD;  Location: [facility];  Service: Cardiovascular;  Laterality: N/A;   CAROTID ANGIOGRAPHY N/A 05/20/2022   Procedure: CAROTID ANGIOGRAPHY;  Surgeon: Broadus John, MD;  Location: [facility];  Service: Cardiovascular;  Laterality: N/A;   CARPAL TUNNEL RELEASE  2013,2011   right and left   EXCISION NASAL MASS Left 09/25/2013   Procedure: EXCISION NASAL CANCER WITH SPLIT THICKNESS SKIN GRAFT FROM RIGHT THIGH;  Surgeon: Ascencion Dike, MD;  Location: [facility];  Service: ENT;  Laterality: Left;   RIGHT OOPHORECTOMY     THUMB ARTHROSCOPY     right   TONSILLECTOMY     TRANSCAROTID ARTERY REVASCULARIZATION  Right 05/28/2022   Procedure: Transcarotid Artery Revascularization;  Surgeon: Broadus John, MD;  Location: [facility];  Service: Vascular;  Laterality: Right;   TUBAL LIGATION      FAMILY HISTORY: Family History  Problem Relation Age of Onset   Emphysema Mother    Heart disease Father    Cancer Sister        cervical   Stroke Neg Hx     SOCIAL HISTORY: Social History   Socioeconomic History   Marital status: Divorced    Spouse name: Not on file   Number of children: Not on file   Years of education: Not on file   Highest education level: Not on file  Occupational History   Not on file  Tobacco Use   Smoking status: Former    Packs/day: 0.50    Types: Cigarettes    Quit date: 05/17/2022    Years since quitting: 0.1   Smokeless  tobacco: Never   Tobacco comments:    Last time smoking was 05/20/22.  Vaping Use   Vaping Use: Never used  Substance and Sexual Activity   Alcohol use: No   Drug use: No   Sexual activity: Yes    Birth control/protection: Surgical  Other Topics Concern   Not on file  Social History Narrative   Not on file   Social Determinants of Health   Financial Resource Strain: Not on file  Food Insecurity: Not on file  Transportation Needs: Not on file  Physical Activity: Not on file  Stress: Not on file  Social Connections: Not on  file  Intimate Partner Violence: Not on file      PHYSICAL EXAM  Vitals:   07/07/22 0932  BP: 137/61  Pulse: (!) 55  Weight: 147 lb (66.7 kg)  Height: 4' 10"  (1.473 m)   Body mass index is 30.72 kg/m.  Generalized: Well developed, in no acute distress   Neurological examination  Mentation: Alert oriented to time, place, history taking. Follows all commands speech and language fluent Cranial nerve II-XII: Pupils were equal round reactive to light. Extraocular movements were full, visual field were full on confrontational test. Facial sensation and strength were normal. Uvula tongue midline. Head turning and shoulder shrug  were normal and symmetric. Motor: The motor testing reveals 5 over 5 strength of all 4 extremities. Good symmetric motor tone is noted throughout.  Sensory: Sensory testing is intact to soft touch on all 4 extremities. No evidence of extinction is noted.  Coordination: Cerebellar testing reveals good finger-nose-finger and heel-to-shin bilaterally.  Gait and station: Gait is normal.  Reflexes: Deep tendon reflexes are symmetric and normal bilaterally.   DIAGNOSTIC DATA (LABS, IMAGING, TESTING) - I reviewed patient records, labs, notes, testing and imaging myself where available.  Lab Results  Component Value Date   WBC 9.4 06/18/2022   HGB 13.1 06/18/2022   HCT 38.1 06/18/2022   MCV 86.2 06/18/2022   PLT 254 06/18/2022       Component Value Date/Time   NA 144 06/18/2022 1939   K 3.4 (L) 06/18/2022 1939   CL 111 06/18/2022 1939   CO2 25 06/18/2022 1939   GLUCOSE 119 (H) 06/18/2022 1939   BUN 11 06/18/2022 1939   CREATININE 0.77 06/18/2022 1939   CALCIUM 9.0 06/18/2022 1939   PROT 6.8 05/28/2022 0623   ALBUMIN 3.7 05/28/2022 0623   AST 27 05/28/2022 0623   ALT 43 05/28/2022 0623   ALKPHOS 75 05/28/2022 0623   BILITOT 0.6 05/28/2022 0623   GFRNONAA >60 06/18/2022 1939   GFRAA  09/01/2009 1740    >60        The eGFR has been calculated using the MDRD equation. This calculation has not been validated in all clinical situations. eGFR's persistently <60 mL/min signify possible Chronic Kidney Disease.   Lab Results  Component Value Date   CHOL 118 05/29/2022   HDL 40 (L) 05/29/2022   LDLCALC 55 05/29/2022   TRIG 113 05/29/2022   CHOLHDL 3.0 05/29/2022   Lab Results  Component Value Date   HGBA1C 5.4 05/18/2022   Lab Results  Component Value Date   [id number] 621 05/18/2022   Lab Results  Component Value Date   TSH 1.362 05/18/2022      ASSESSMENT AND PLAN 61 y.o. year old female  has a past medical history of Anxiety disorder, Depression, Fibromyalgia, GERD (gastroesophageal reflux disease), Herpes, Hyperlipidemia, Hypertension, PONV (postoperative nausea and vomiting), and Stroke ([place]) (05/17/2022). here with:  Stroke:  right MCA scattered infarcts, largest at right frontal parietal region, likely artery to artery embolism  from right ICA high-grade stenosis versus occlusion  Hyperlipidemia Hypertension   Continue clopidogrel 75 mg daily  and ASA per [facility]  for secondary stroke prevention.  Discussed secondary stroke prevention measures and importance of close PCP follow up for aggressive stroke risk factor management. I have gone over the pathophysiology of stroke, warning signs and symptoms, risk factors and their management in some detail with instructions to go to the closest  emergency room for symptoms of concern. HTN: BP goal <130/90.  Managed  by PCP HLD: LDL goal <70. Recent LDL 83.  Currently on Crestor DMII: A1c goal<7.0. Recent A1c 5.4.  Encouraged patient to monitor diet and encouraged exercise Patient will be sent for sleep evaluation.  She is not aware if she snores.  Does not endorse significant daytime sleepiness but does report fatigue.  Due to her history of stroke, hypertension will rule out sleep apnea FU with our office PRN   Ward Givens, MSN, NP-C 07/07/2022, 9:50 AM [facility] [address] [place], [place] [address] [phone]

## 2022-07-13 NOTE — Progress Notes (Signed)
I agree with the above plan 

## 2022-07-21 ENCOUNTER — Encounter: Payer: Medicare Other | Admitting: Physical Medicine and Rehabilitation

## 2022-07-22 ENCOUNTER — Institutional Professional Consult (permissible substitution): Payer: Medicare Other | Admitting: Neurology

## 2022-07-23 IMAGING — CT CT HEAD W/O CM
4 series · 16 of 47 positions shown, 18 images · non-contrast
Comparison: CT brain 06/25/2009

CLINICAL DATA: Headache dizziness



[Series 2: head w o · axial · 0.40mm/px · z∈[+45,+145]mm · 7 of 28 slices shown, 9 images]
[im 4/28  brain]
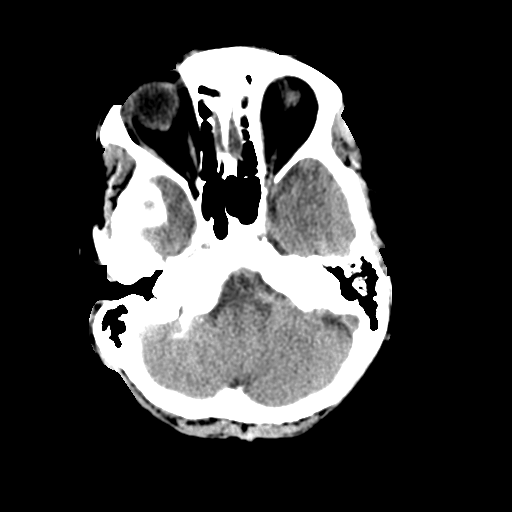
[im 4/28  bone]
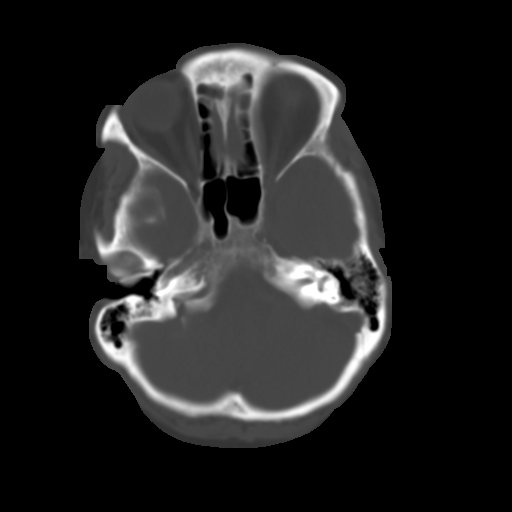
[im 7/28  brain]
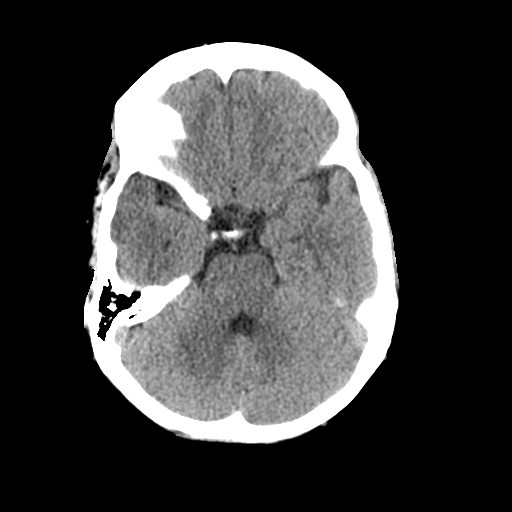
[im 11/28  brain]
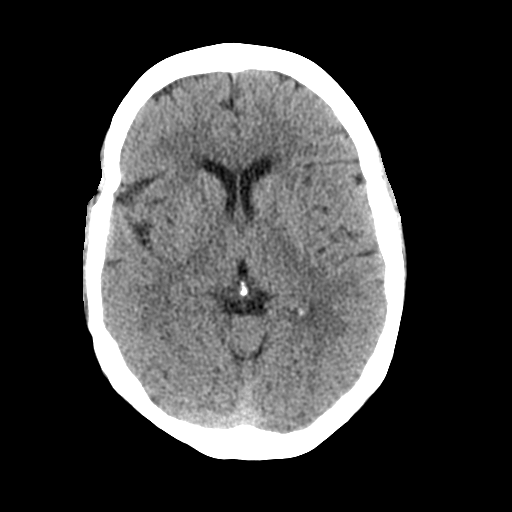
[im 14/28  brain]
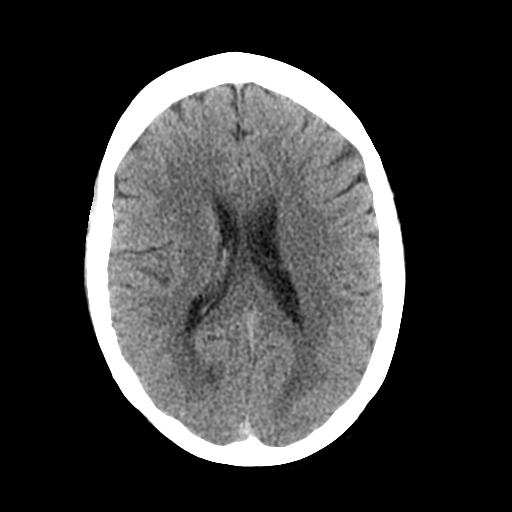
[im 17/28  brain]
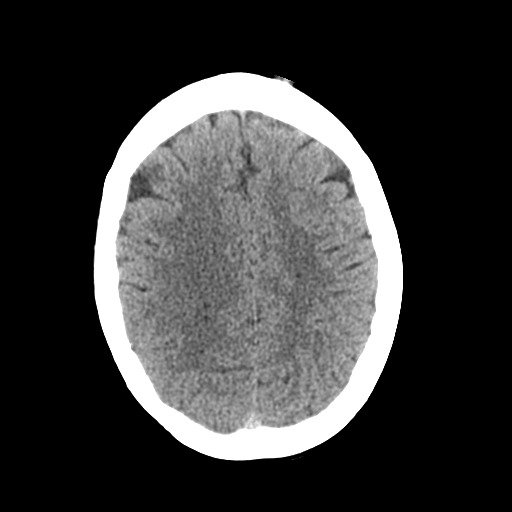
[im 17/28  bone]
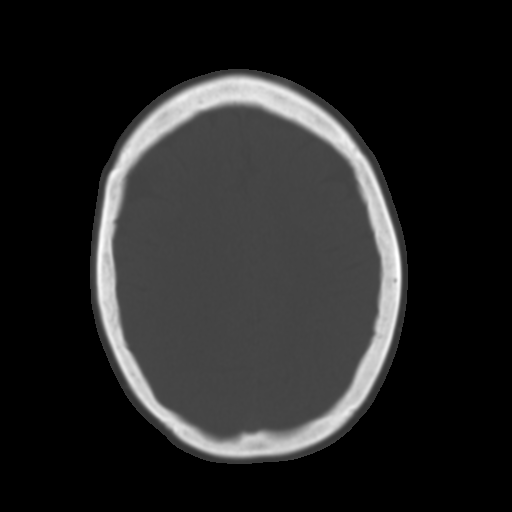
[im 21/28  brain]
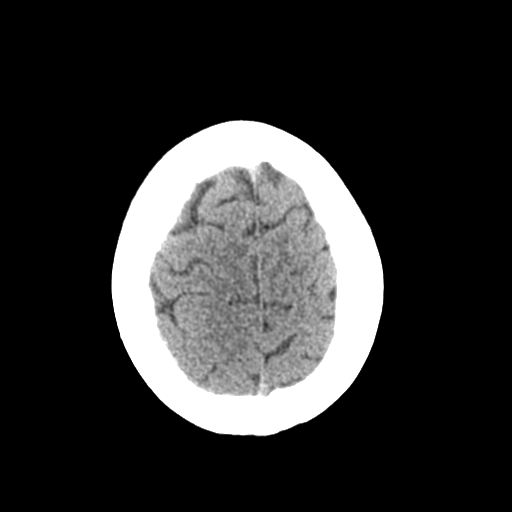
[im 24/28  brain]
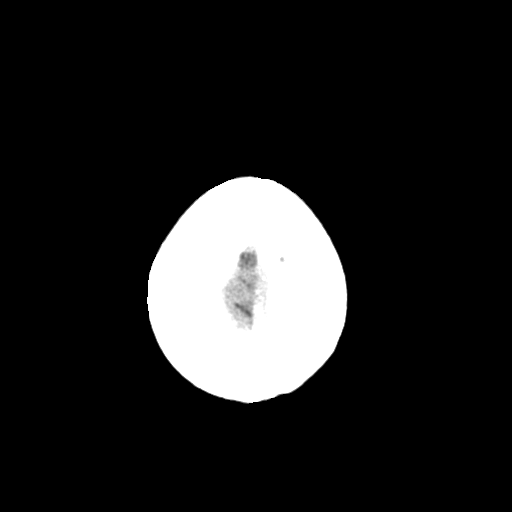

[Series 3: head bone · axial · 0.40mm/px · z∈[+42,+70]mm · 3 of 69 slices shown]
[im 7/69  bone]
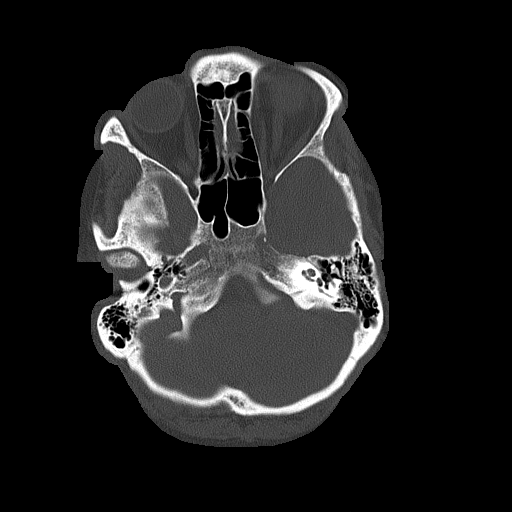
[im 14/69  bone]
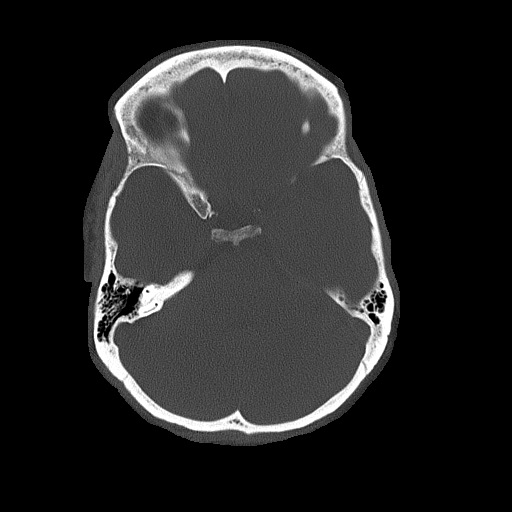
[im 21/69  bone]
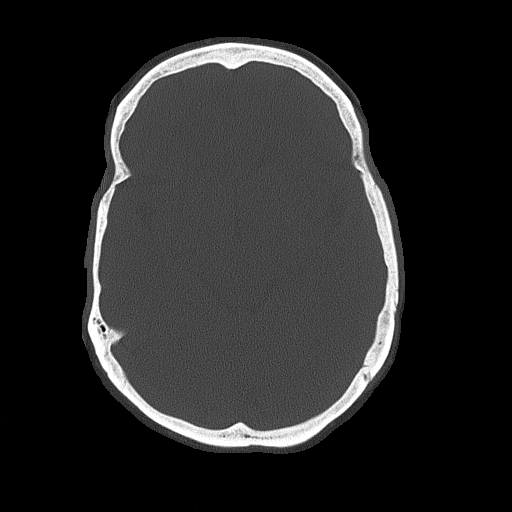

[Series 4: coronal soft · coronal · 0.29mm/px · 3 of 68 slices shown]
[im 23/68  brain]
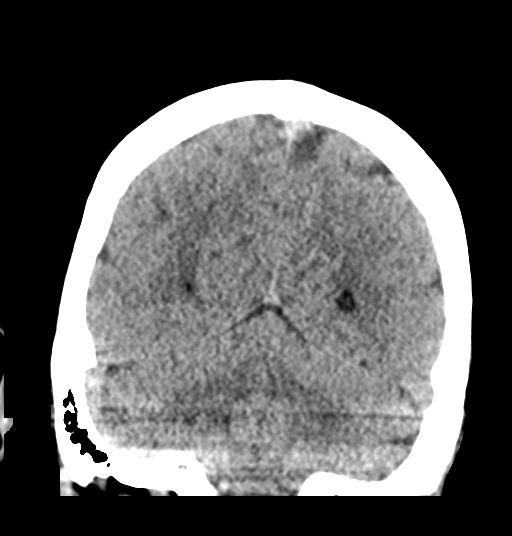
[im 30/68  brain]
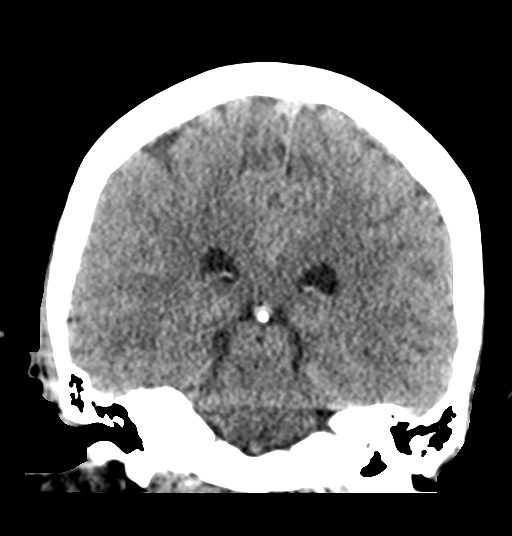
[im 38/68  brain]
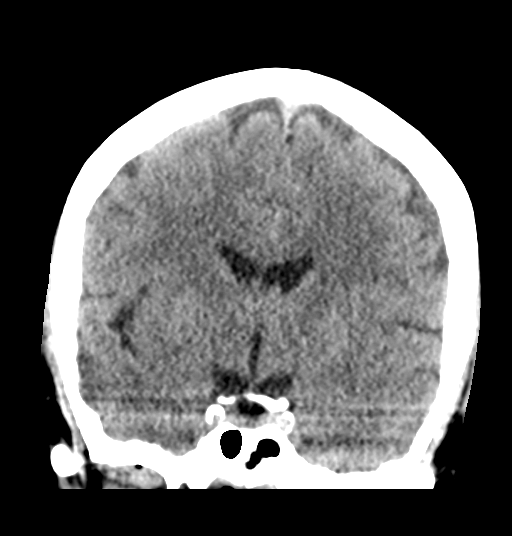

[Series 5: sagittal soft · sagittal · 0.31mm/px · 3 of 56 slices shown]
[im 19/56  brain]
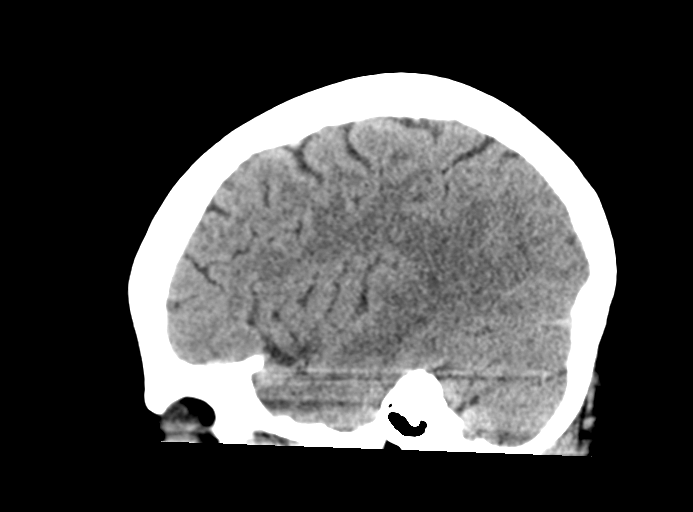
[im 28/56  brain]
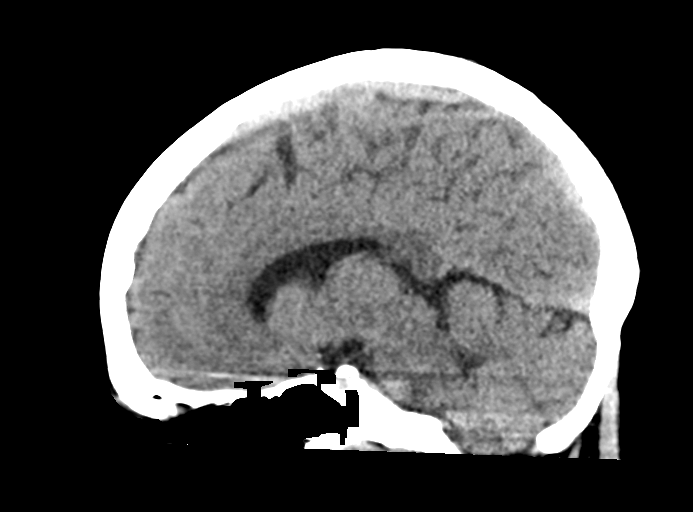
[im 37/56  brain]
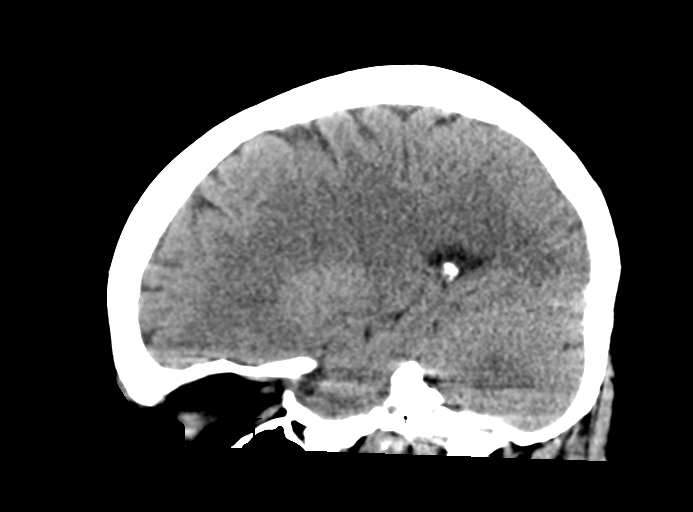

[16 of 47 positions shown; findings below may reference images not displayed]

FINDINGS: Brain: No evidence of acute infarction, hemorrhage, hydrocephalus,
extra-axial collection or mass lesion/mass effect.

Vascular: No hyperdense vessel or unexpected calcification. Mild
carotid vascular calcification

Skull: Normal. Negative for fracture or focal lesion.

Sinuses/Orbits: No acute finding.

Other: None
IMPRESSION: Negative non contrasted CT appearance of the brain

## 2022-08-10 ENCOUNTER — Encounter: Payer: Self-pay | Admitting: Neurology

## 2022-08-10 ENCOUNTER — Institutional Professional Consult (permissible substitution): Payer: Medicare Other | Admitting: Neurology

## 2022-08-11 DIAGNOSIS — R5383 Other fatigue: Secondary | ICD-10-CM | POA: Diagnosis not present

## 2022-09-07 ENCOUNTER — Institutional Professional Consult (permissible substitution): Payer: Medicare Other | Admitting: Neurology

## 2022-09-08 ENCOUNTER — Encounter: Payer: Self-pay | Admitting: Neurology

## 2022-09-08 ENCOUNTER — Ambulatory Visit (INDEPENDENT_AMBULATORY_CARE_PROVIDER_SITE_OTHER): Payer: Medicare Other | Admitting: Neurology

## 2022-09-08 VITALS — BP 123/57 | HR 57 | Ht <= 58 in | Wt 154.0 lb

## 2022-09-08 DIAGNOSIS — I63511 Cerebral infarction due to unspecified occlusion or stenosis of right middle cerebral artery: Secondary | ICD-10-CM

## 2022-09-08 DIAGNOSIS — G4719 Other hypersomnia: Secondary | ICD-10-CM | POA: Diagnosis not present

## 2022-09-08 DIAGNOSIS — I6521 Occlusion and stenosis of right carotid artery: Secondary | ICD-10-CM | POA: Diagnosis not present

## 2022-09-08 DIAGNOSIS — E782 Mixed hyperlipidemia: Secondary | ICD-10-CM | POA: Diagnosis not present

## 2022-09-08 DIAGNOSIS — R5383 Other fatigue: Secondary | ICD-10-CM | POA: Diagnosis not present

## 2022-09-08 NOTE — Progress Notes (Signed)
SLEEP MEDICINE CLINIC    Provider:  Larey Seat, MD  Primary Care Physician:  Celene Squibb, MD [place] [address]     Referring Provider: Ward Givens, [place],  [place] [address]          Chief Complaint according to patient   Patient presents with:     New Patient (Initial Visit)           HISTORY OF PRESENT ILLNESS:  Diamond Adams is a 61 y.o. year old White or Caucasian female patient seen here as a referral on 09/08/2022 from [facility]  for a sleep evaluation. .  Chief concern according to patient :  " I am sleepy all the time, I can fall asleep when I rest and I am not stimulated. I feel more fatigued, weaker and not refreshed in AM. My family confirms I don't snore. I had a stroke in 04-2022,  my strength in the left face and arm changed" - Last seen by MM ,NP for Dr Leonie Man, MD. 07-07-2022   Diamond Adams  has a past medical history of Anxiety disorder, Depression, Fibromyalgia, GERD (gastroesophageal reflux disease), CVA ( June 2023, right CA ).     Sleep relevant medical history: recent CVA- Cognitive impairment since stroke, fatigue, heaviness, depression,  Nocturia 2 times, Tonsillectomy in childhood , deviated septum , cancerous skin tumor removed from left nasion.    Family medical /sleep history: no other family member on CPAP with OSA, insomnia, sleep walkers.    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 Tobacco use- yes until the stroke - .  ETOH use none, Caffeine intake in form of Coffee( 1 cup ) Soda( 3 / day ) Tea ( sometimes ) or energy drinks. Regular exercise in form of walking .     Sleep habits are as follows: The patient's dinner time is between 5-7 PM. The patient goes to bed at 11 PM and stops using her smart phone, sleep for 3-6 hours, wakes  for 1-2 bathroom breaks, the first time at 3 AM.   The preferred sleep position is laterally, with the support of 2 pillows.  Dreams are reportedly frequent.  9-12 AM is the usual rise time. The patient wakes up spontaneously  at 5 Am but dozes off for another 2-3 hours , light sleep. She reports not feeling refreshed or restored in AM, with symptoms such as dry mouth, and residual fatigue.  Naps are taken infrequently, lasting from 2-3 hours , and less refreshing than nocturnal sleep.    Review of Systems: Out of a complete 14 system review, the patient complains of only the following symptoms, and all other reviewed systems are negative.:  Fatigue, sleepiness ,but not  snoring, fragmented sleep, hypersomnia, daytime naps,    How likely are you to doze in the following situations: 0 = not likely, 1 = slight chance, 2 = moderate chance, 3 = high chance   Sitting and Reading? Watching Television? Sitting inactive in a public place (theater or meeting)? As a passenger in a car for an hour without a break? Lying down in the afternoon when circumstances permit? Sitting and talking to someone? Sitting quietly after lunch without alcohol? In a car, while stopped for a  few minutes in traffic?   Total = 14/ 24 points   FSS endorsed at 49/ 63 points  GDS 7 / 15 points. .   Social History   Socioeconomic History   Marital status: Divorced    Spouse name: Not on file   Number of children: Not on file   Years of education: Not on file   Highest education level: Not on file  Occupational History   Not on file  Tobacco Use   Smoking status: Former    Packs/day: 0.50    Types: Cigarettes    Quit date: 05/17/2022    Years since quitting: 0.3   Smokeless tobacco: Never   Tobacco comments:    Last time smoking was 05/20/22.  Vaping Use   Vaping Use: Never used  Substance and Sexual Activity   Alcohol use: No   Drug use: No   Sexual activity: Yes    Birth control/protection: Surgical   Other Topics Concern   Not on file  Social History Narrative   Not on file   Social Determinants of Health   Financial Resource Strain: Not on file  Food Insecurity: Not on file  Transportation Needs: Not on file  Physical Activity: Not on file  Stress: Not on file  Social Connections: Not on file    Family History  Problem Relation Age of Onset   Emphysema Mother    Heart disease Father    Cancer Sister        cervical   Stroke Neg Hx     Past Medical History:  Diagnosis Date   Anxiety disorder    Depression    Fibromyalgia    GERD (gastroesophageal reflux disease)    Herpes    PONV (postoperative nausea and vomiting)     Past Surgical History:  Procedure Laterality Date   ABDOMINAL HYSTERECTOMY  1994   AORTIC ARCH ANGIOGRAPHY N/A 05/20/2022   Procedure: AORTIC ARCH ANGIOGRAPHY;  Surgeon: Broadus John, MD;  Location: [facility];  Service: Cardiovascular;  Laterality: N/A;   CAROTID ANGIOGRAPHY N/A 05/20/2022   Procedure: CAROTID ANGIOGRAPHY;  Surgeon: Broadus John, MD;  Location: [facility];  Service: Cardiovascular;  Laterality: N/A;   CARPAL TUNNEL RELEASE  2013,2011   right and left   EXCISION NASAL MASS Left 09/25/2013   Procedure: EXCISION NASAL CANCER WITH SPLIT THICKNESS SKIN GRAFT FROM RIGHT THIGH;  Surgeon: Ascencion Dike, MD;  Location: [facility];  Service: ENT;  Laterality: Left;   RIGHT OOPHORECTOMY     THUMB ARTHROSCOPY     right   TONSILLECTOMY     TRANSCAROTID ARTERY REVASCULARIZATION  Right 05/28/2022   Procedure: Transcarotid Artery Revascularization;  Surgeon: Broadus John, MD;  Location: [facility];  Service: Vascular;  Laterality: Right;   TUBAL LIGATION       Current Outpatient Medications on File Prior to Visit  Medication Sig Dispense Refill   ALPRAZolam (XANAX) 0.25 MG tablet Take 1 tablet (0.25 mg total) by mouth at bedtime as needed for anxiety. 15 tablet 0   amLODipine (NORVASC) 5 MG tablet Take 5 mg  by mouth daily.     FLUoxetine (PROZAC) 10 MG capsule Take 1 capsule (10 mg total) by mouth daily. 30 capsule 0   lisinopril (ZESTRIL) 20 MG tablet Take 1 tablet (20 mg total) by mouth daily. 30 tablet 1   lisinopril (ZESTRIL) 40 MG tablet Take 40 mg by mouth daily.  pantoprazole (PROTONIX) 40 MG tablet Take 1 tablet (40 mg total) by mouth daily as needed (reflux). 30 tablet 0   rosuvastatin (CRESTOR) 20 MG tablet Take 1 tablet (20 mg total) by mouth daily. 30 tablet 0   No current facility-administered medications on file prior to visit.    Allergies  Allergen Reactions   Trazodone And Nefazodone Hives    Physical exam:  Today's Vitals   09/08/22 1444  BP: (!) 123/57  Pulse: (!) 57  Weight: 154 lb (69.9 kg)  Height: '4\' 10"'$  (1.473 m)   Body mass index is 32.19 kg/m.   Wt Readings from Last 3 Encounters:  09/08/22 154 lb (69.9 kg)  07/07/22 147 lb (66.7 kg)  07/03/22 144 lb (65.3 kg)     Ht Readings from Last 3 Encounters:  09/08/22 '4\' 10"'$  (1.473 m)  07/07/22 '4\' 10"'$  (1.473 m)  07/03/22 '4\' 10"'$  (1.473 m)      General: The patient is awake, alert and appears not in acute distress. The patient is well groomed. Head: Normocephalic, atraumatic. Neck is supple. Mallampati 1-2  neck circumference:13. 5 inches . Nasal airflow  patent.  Retrognathia is not seen.  Dental status: biological  Cardiovascular:  Regular rate and cardiac rhythm by pulse,  without distended neck veins. Respiratory: Lungs are clear to auscultation.  Skin:  Without evidence of ankle edema, or rash. Trunk: The patient's posture is erect.   Neurologic exam : The patient is awake and alert, oriented to place and time.   Memory subjective described as impaired since her stroke. Affecting mathematics, some hesitation of speech,  right handed.  Attention span & concentration ability appears normal.  Speech is fluent,  without  dysarthria, dysphonia or aphasia.  Mood and affect are depressed, lethargic.     Cranial nerves: no loss of smell or taste reported  Pupils are equal and briskly reactive to light. Funduscopic exam deferred..  Extraocular movements in vertical and horizontal planes were intact and without nystagmus. No Diplopia. Visual fields by finger perimetry are intact. 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 Smaller eye opening.  Hearing was intact to soft voice and finger rubbing.  Tuning fork air conduction hyperacusis on the left, non lateralizing bone conduction.   Facial sensation intact to fine touch.  Facial motor strength is asymmetric , left angle of the mouth is lower- but  tongue and uvula move midline.  Neck ROM : rotation, tilt and flexion extension were normal for age and shoulder shrug was symmetrical.    Motor exam:  Symmetric bulk, tone and ROM.   Normal tone without cog- wheeling, symmetric  ( weak )grip strength .   Sensory:  Fine touch  and vibration were tested  and  normal.  Proprioception tested in the upper extremities was normal.   Coordination: Rapid alternating movements in the fingers/hands were of normal speed.  The Finger-to-nose maneuver was intact without evidence of ataxia, dysmetria or tremor.  Gait and station: Patient could rise unassisted from a seated position, walked without assistive device.  Stance is of normal width/ base and the patient turned with 4 steps.  Toe and heel walk were deferred.  Deep tendon reflexes: in the  upper and lower extremities are symmetric and intact.  Babinski response was deferred.       After spending a total time of  45  minutes face to face and additional time for physical and neurologic examination, review of laboratory studies,  personal review of imaging studies, reports and  results of other testing and review of referral information / records as far as provided in visit, I have established the following assessments:  1) Diamond Adams suffered a ischemic right MCA stroke about 3 months ago, she has  been left with some residual facial asymmetry, enophthalmos of the left eye a sunken eyelid on the left and slight droop of the left angle of the mouth.  But her sensation and proprioception is remarkably intact.  She reports feeling much more fatigued since that stroke occurred sleepy easier exhausted and not tolerant of as much exercise and physical activity.     2)EDS : She also states that when she is not physically active and not mentally stimulated it is very easy for her to fall promptly asleep.  She sleeps longer into the day when she can and sometimes is maybe 10 hours.  Her fatigue score was also endorsed at a high level.   3) Possible hypoxia : She reports that her family has not noted her to snore but given the right brain hemisphere being affected in this ischemic stroke it is worthwhile checking her for sleep apnea.   I would be happy with a home sleep test for this patient who was until very recently a smoker and may have some additional potential for hypoxemia.  35 Risk for OSA : BMI was 32.2.  Blood pressure here was very well controlled as well as heart rate and rhythm.  She tends to have bradycardia according to her past office visits.    My Plan is to proceed with:  1) either PSG or HST  2) encourage fluid intake ( water )  3) encourage frequent short times of exercise.  4)consider to evaluate for depression, anxiety and memory problems.   I would like to thank Celene Squibb, MD and Ward Givens, [place],  [place] [address] for allowing me to meet with and to take care of this pleasant patient.    I plan to follow up either personally or through our NP within 2-4 months.    CC: I will share my notes with Dr Leonie Man and NP .  Electronically signed by: Larey Seat, MD 09/08/2022 3:07 PM  [facility] Board certified by The [employer] of Sleep Medicine and Diplomate of the [employer] of Sleep  Medicine. Board certified In Neurology through the [place], Fellow of the [employer] of Neurology. Medical Director of [employer].

## 2022-09-08 NOTE — Patient Instructions (Signed)
Screening for Sleep Apnea  Sleep apnea is a condition in which breathing pauses or becomes shallow during sleep. Sleep apnea screening is a test to determine if you are at risk for sleep apnea. The test includes a series of questions. It will only takes a few minutes. Your health care provider may ask you to have this test in preparation for surgery or as part of a physical exam. What are the symptoms of sleep apnea? Common symptoms of sleep apnea include: Snoring. Waking up often at night. Daytime sleepiness. Pauses in breathing. Choking or gasping during sleep. Irritability. Forgetfulness. Trouble thinking clearly. Depression. Personality changes. Most people with sleep apnea do not know that they have it. What are the advantages of sleep apnea screening? Getting screened for sleep apnea can help: Ensure your safety. It is important for your health care providers to know whether or not you have sleep apnea, especially if you are having surgery or have other long-term (chronic) health conditions. Improve your health and allow you to get a better night's rest. Restful sleep can help you: Have more energy. Lose weight. Improve high blood pressure. Improve diabetes management. Prevent stroke. Prevent car accidents. What happens during the screening? Screening usually includes being asked a list of questions about your sleep quality. Some questions you may be asked include: Do you snore? Is your sleep restless? Do you have daytime sleepiness? Has a partner or spouse told you that you stop breathing during sleep? Have you had trouble concentrating or memory loss? What is your age? What is your neck circumference? To measure your neck, keep your back straight and gently wrap the tape measure around your neck. Put the tape measure at the middle of your neck, between your chin and collarbone. What is your sex assigned at birth? Do you have or are you being treated for high blood  pressure? If your screening test is positive, you are at risk for the condition. Further testing may be needed to confirm a diagnosis of sleep apnea. Where to find more information You can find screening tools online or at your health care clinic. For more information about sleep apnea screening and healthy sleep, visit these websites: Centers for Disease Control and Prevention: http://www.wolf.info/ American Sleep Apnea Association: www.sleepapnea.org Contact a health care provider if: You think that you may have sleep apnea. Summary Sleep apnea screening can help determine if you are at risk for sleep apnea. It is important for your health care providers to know whether or not you have sleep apnea, especially if you are having surgery or have other chronic health conditions. You may be asked to take a screening test for sleep apnea in preparation for surgery or as part of a physical exam. This information is not intended to replace advice given to you by your health care provider. Make sure you discuss any questions you have with your health care provider. Document Revised: 10/18/2020 Document Reviewed: 10/18/2020 Elsevier Patient Education  [place]. Hypersomnia Hypersomnia is a condition in which a person feels very tired during the day even though the person gets plenty of sleep at night. A person with this condition may take naps during the day and may find it very difficult to wake up from sleep. Hypersomnia may affect a person's ability to think, concentrate, drive, or remember things. What are the causes? The cause of this condition may not be known. Possible causes include: Taking certain medicines. Using drugs or alcohol. Sleep disorders, such as narcolepsy and  sleep apnea. Injury to the head, brain, or spinal cord. Tumors. Certain medical conditions. These include: Depression. Diabetes. Gastroesophageal reflux disease (GERD). An underactive thyroid gland  (hypothyroidism). What are the signs or symptoms? The main symptoms of hypersomnia include: Feeling very tired throughout the day, regardless of how much sleep you got the night before. Having trouble waking up. Others may find it difficult to wake you up when you are sleeping. Sleeping for longer and longer periods at a time. Taking naps throughout the day. Other symptoms may include: Feeling restless, anxious, or annoyed. Lacking energy. Having trouble with: Remembering. Speaking. Thinking. Loss of appetite. Seeing, hearing, tasting, smelling, or feeling things that are not real (hallucinations). How is this diagnosed? This condition may be diagnosed based on: Your symptoms and medical history. Your sleeping habits. Your health care provider may ask you to write down your sleeping habits in a daily sleep log, along with any symptoms you have. A series of tests that are done while you sleep (sleep study or polysomnogram). A test that measures how quickly you can fall asleep during the day (daytime nap study or multiple sleep latency test). How is this treated? This condition may be treated by: Following a regular sleep routine. Making lifestyle changes, such as changing your eating habits, getting regular exercise, and avoiding alcohol or caffeinated beverages. Taking medicines to make you more alert (stimulants) during the day. Treating any underlying medical causes of hypersomnia. Follow these instructions at home: Sleep habits Stick to a routine that includes going to bed and waking up at the same times every day and night. Practice a relaxing bedtime routine. This may include reading, meditation, deep breathing, or taking a warm bath before going to sleep. Exercise regularly as told by your health care provider. However, avoid exercising in the hours right before bedtime. Keep your sleep environment at a cooler temperature, darkened, and quiet. Sleep with pillows and a mattress  that are comfortable and supportive. Schedule short 20-minute naps for when you feel sleepiest during the day. Talk with your employer or teachers about your hypersomnia. If possible, adjust your schedule so that: You have a regular daytime work schedule. You can take a scheduled nap during the day. You do not have to work or be active at night. Do not eat a heavy meal for a few hours before bedtime. Eat your meals at about the same times every day. Safety  Do not drive or use machinery if you are sleepy. Ask your health care provider if it is safe for you to drive. Wear a life jacket when swimming or spending time near water. General instructions  Take over-the-counter and prescription medicines only as told by your health care provider. This includes supplements. Avoid drinking alcohol or caffeinated beverages. Keep a sleep log that will help your health care provider manage your condition. This may include information about: What time you go to bed each night. How often you wake up at night. How many hours you sleep at night. How often and for how long you nap during the day. Any observations from others, such as leg movements during sleep, sleep walking, or snoring. Keep all follow-up visits. This is important. Contact a health care provider if: You have new symptoms. Your symptoms get worse. Get help right away if: You have thoughts about hurting yourself or someone else. Get help right away if you feel like you may hurt yourself or others, or have thoughts about taking your own life. Go to  your nearest emergency room or: Call 911. Call the [facility] at (708)540-2099 or 988. This is open 24 hours a day. Text the Crisis Text Line at 310 530 9287. Summary Hypersomnia refers to a condition in which you feel very tired during the day even though you get plenty of sleep at night. A person with this condition may take naps during the day and may find it very  difficult to wake up from sleep. Hypersomnia may affect a person's ability to think, concentrate, drive, or remember things. Treatment may include a regular sleep routine and making some lifestyle changes. This information is not intended to replace advice given to you by your health care provider. Make sure you discuss any questions you have with your health care provider. Document Revised: 10/20/2021 Document Reviewed: 10/20/2021 Elsevier Patient Education  [place].

## 2022-09-12 NOTE — Progress Notes (Signed)
I agree with the above plan 

## 2022-09-14 ENCOUNTER — Telehealth: Payer: Self-pay | Admitting: Neurology

## 2022-09-14 NOTE — Telephone Encounter (Signed)
[facility]PSG- [employer] medicare no auth req.  Patient is scheduled at [facility] for 10/27/22 at 8 pm.  Mailed packet to the patient.

## 2022-09-22 ENCOUNTER — Encounter (INDEPENDENT_AMBULATORY_CARE_PROVIDER_SITE_OTHER): Payer: Self-pay

## 2022-09-23 ENCOUNTER — Emergency Department (HOSPITAL_COMMUNITY)
Admission: EM | Admit: 2022-09-23 | Discharge: 2022-09-23 | Disposition: A | Discharge status: Home / Self Care | Payer: Medicare Other | Attending: Emergency Medicine | Admitting: Emergency Medicine

## 2022-09-23 ENCOUNTER — Emergency Department (HOSPITAL_COMMUNITY): Payer: Medicare Other

## 2022-09-23 ENCOUNTER — Other Ambulatory Visit: Payer: Self-pay

## 2022-09-23 ENCOUNTER — Encounter (HOSPITAL_COMMUNITY): Payer: Self-pay | Admitting: Radiology

## 2022-09-23 DIAGNOSIS — M47812 Spondylosis without myelopathy or radiculopathy, cervical region: Secondary | ICD-10-CM | POA: Diagnosis not present

## 2022-09-23 DIAGNOSIS — R0689 Other abnormalities of breathing: Secondary | ICD-10-CM | POA: Diagnosis not present

## 2022-09-23 DIAGNOSIS — R531 Weakness: Secondary | ICD-10-CM | POA: Diagnosis not present

## 2022-09-23 DIAGNOSIS — Z743 Need for continuous supervision: Secondary | ICD-10-CM | POA: Diagnosis not present

## 2022-09-23 DIAGNOSIS — Z7982 Long term (current) use of aspirin: Secondary | ICD-10-CM | POA: Diagnosis not present

## 2022-09-23 DIAGNOSIS — Z7901 Long term (current) use of anticoagulants: Secondary | ICD-10-CM | POA: Insufficient documentation

## 2022-09-23 DIAGNOSIS — I6782 Cerebral ischemia: Secondary | ICD-10-CM | POA: Diagnosis not present

## 2022-09-23 DIAGNOSIS — I639 Cerebral infarction, unspecified: Secondary | ICD-10-CM | POA: Diagnosis not present

## 2022-09-23 DIAGNOSIS — Z95828 Presence of other vascular implants and grafts: Secondary | ICD-10-CM | POA: Diagnosis not present

## 2022-09-23 LAB — CBC WITH DIFFERENTIAL/PLATELET
Abs Immature Granulocytes: 0.04 10*3/uL (ref 0.00–0.07)
Basophils Absolute: 0.1 10*3/uL (ref 0.0–0.1)
Basophils Relative: 1 %
Eosinophils Absolute: 0.2 10*3/uL (ref 0.0–0.5)
Eosinophils Relative: 2 %
HCT: 42.7 % (ref 36.0–46.0)
Hemoglobin: 14.5 g/dL (ref 12.0–15.0)
Immature Granulocytes: 0 %
Lymphocytes Relative: 28 %
Lymphs Abs: 3.1 10*3/uL (ref 0.7–4.0)
MCH: 27.8 pg (ref 26.0–34.0)
MCHC: 34 g/dL (ref 30.0–36.0)
MCV: 81.8 fL (ref 80.0–100.0)
Monocytes Absolute: 0.7 10*3/uL (ref 0.1–1.0)
Monocytes Relative: 6 %
Neutro Abs: 7 10*3/uL (ref 1.7–7.7)
Neutrophils Relative %: 63 %
Platelets: 305 10*3/uL (ref 150–400)
RBC: 5.22 MIL/uL — ABNORMAL HIGH (ref 3.87–5.11)
RDW: 12.9 % (ref 11.5–15.5)
WBC: 11.1 10*3/uL — ABNORMAL HIGH (ref 4.0–10.5)
nRBC: 0 % (ref 0.0–0.2)

## 2022-09-23 LAB — COMPREHENSIVE METABOLIC PANEL
ALT: 23 U/L (ref 0–44)
AST: 29 U/L (ref 15–41)
Albumin: 4.3 g/dL (ref 3.5–5.0)
Alkaline Phosphatase: 82 U/L (ref 38–126)
Anion gap: 10 (ref 5–15)
BUN: 13 mg/dL (ref 6–20)
CO2: 24 mmol/L (ref 22–32)
Calcium: 9.4 mg/dL (ref 8.9–10.3)
Chloride: 103 mmol/L (ref 98–111)
Creatinine, Ser: 0.86 mg/dL (ref 0.44–1.00)
GFR, Estimated: >60 mL/min (ref >60)
Glucose, Bld: 161 mg/dL — ABNORMAL HIGH (ref 70–99)
Potassium: 3.8 mmol/L (ref 3.5–5.1)
Sodium: 137 mmol/L (ref 135–145)
Total Bilirubin: 0.5 mg/dL (ref 0.3–1.2)
Total Protein: 7.3 g/dL (ref 6.5–8.1)

## 2022-09-23 LAB — TROPONIN I (HIGH SENSITIVITY)
Troponin I (High Sensitivity): 2 ng/L (ref <18)
Troponin I (High Sensitivity): 2 ng/L (ref <18)

## 2022-09-23 LAB — CBG MONITORING, ED: Glucose-Capillary: 165 mg/dL — ABNORMAL HIGH (ref 70–99)

## 2022-09-23 MED ORDER — LORAZEPAM 2 MG/ML IJ SOLN
0.5000 mg | Freq: Once | INTRAMUSCULAR | Status: AC
  Administered 2022-09-23: 0.5 mg via INTRAVENOUS
  Filled 2022-09-23: qty 1

## 2022-09-23 MED ORDER — IOHEXOL 350 MG/ML SOLN
100.0000 mL | Freq: Once | INTRAVENOUS | Status: AC | PRN
  Administered 2022-09-23: 75 mL via INTRAVENOUS

## 2022-09-23 NOTE — ED Provider Notes (Signed)
[facility] EMERGENCY DEPARTMENT Provider Note   CSN: [id number] Arrival date & time: 09/23/22  1846     History  Chief Complaint  Patient presents with   Weakness    Diamond Adams is a 61 y.o. female.  Patient with history of stroke.  She is on Plavix and aspirin.  She had some heaviness in her arms today  The history is provided by the patient and medical records. No language interpreter was used.  Weakness Severity:  Moderate Onset quality:  Sudden Timing:  Intermittent Progression:  Waxing and waning Chronicity:  Recurrent Context: not alcohol use   Relieved by:  Nothing Worsened by:  Nothing Ineffective treatments:  None tried Associated symptoms: no abdominal pain, no chest pain, no cough, no diarrhea, no frequency, no headaches and no seizures        Home Medications Prior to Admission medications   Medication Sig Start Date End Date Taking? Authorizing Provider  ALPRAZolam (XANAX) 0.25 MG tablet Take 1 tablet (0.25 mg total) by mouth at bedtime as needed for anxiety. 05/26/22  Yes Setzer, Edman Circle, PA-C  amLODipine (NORVASC) 5 MG tablet Take 5 mg by mouth daily. 07/01/22  Yes [provider]  aspirin EC 325 MG tablet Take 325 mg by mouth daily.   Yes [provider]  clopidogrel (PLAVIX) 75 MG tablet Take 75 mg by mouth daily. 09/01/22  Yes [provider]  FLUoxetine (PROZAC) 10 MG capsule Take 1 capsule (10 mg total) by mouth daily. 05/26/22  Yes Setzer, Edman Circle, PA-C  lisinopril (ZESTRIL) 40 MG tablet Take 40 mg by mouth daily. 06/22/22  Yes [provider]  pantoprazole (PROTONIX) 40 MG tablet Take 1 tablet (40 mg total) by mouth daily as needed (reflux). 05/26/22  Yes Setzer, Edman Circle, PA-C  rosuvastatin (CRESTOR) 20 MG tablet Take 1 tablet (20 mg total) by mouth daily. 05/26/22 09/23/22 Yes Setzer, Edman Circle, PA-C  lisinopril (ZESTRIL) 20 MG tablet Take 1 tablet (20 mg total) by mouth daily. Patient not taking: Reported on 09/23/2022  06/19/22   Noemi Chapel, MD      Allergies    Trazodone and nefazodone    Review of Systems   Review of Systems  Constitutional:  Negative for appetite change and fatigue.  HENT:  Negative for congestion, ear discharge and sinus pressure.   Eyes:  Negative for discharge.  Respiratory:  Negative for cough.   Cardiovascular:  Negative for chest pain.  Gastrointestinal:  Negative for abdominal pain and diarrhea.  Genitourinary:  Negative for frequency and hematuria.  Musculoskeletal:  Negative for back pain.  Skin:  Negative for rash.  Neurological:  Positive for weakness. Negative for seizures and headaches.  Psychiatric/Behavioral:  Negative for hallucinations.     Physical Exam Updated Vital Signs BP 126/62   Pulse 60   Temp 97.7 F (36.5 C) (Oral)   Resp 18   Ht '4\' 10"'$  (1.473 m)   Wt 69.9 kg   SpO2 96%   BMI 32.19 kg/m  Physical Exam Vitals and nursing note reviewed.  Constitutional:      Appearance: She is well-developed.  HENT:     Head: Normocephalic.     Nose: Nose normal.  Eyes:     General: No scleral icterus.    Conjunctiva/sclera: Conjunctivae normal.  Neck:     Thyroid: No thyromegaly.  Cardiovascular:     Rate and Rhythm: Normal rate and regular rhythm.     Heart sounds: No murmur heard.  No friction rub. No gallop.  Pulmonary:     Breath sounds: No stridor. No wheezing or rales.  Chest:     Chest wall: No tenderness.  Abdominal:     General: There is no distension.     Tenderness: There is no abdominal tenderness. There is no rebound.  Musculoskeletal:        General: Normal range of motion.     Cervical back: Neck supple.     Comments: Mild weakness in upper extremities  Lymphadenopathy:     Cervical: No cervical adenopathy.  Skin:    Findings: No erythema or rash.  Neurological:     Mental Status: She is alert and oriented to person, place, and time.     Motor: No abnormal muscle tone.     Coordination: Coordination normal.   Psychiatric:        Behavior: Behavior normal.     ED Results / Procedures / Treatments   Labs (all labs ordered are listed, but only abnormal results are displayed) Labs Reviewed  CBC WITH DIFFERENTIAL/PLATELET - Abnormal; Notable for the following components:      Result Value   WBC 11.1 (*)    RBC 5.22 (*)    All other components within normal limits  COMPREHENSIVE METABOLIC PANEL - Abnormal; Notable for the following components:   Glucose, Bld 161 (*)    All other components within normal limits  CBG MONITORING, ED - Abnormal; Notable for the following components:   Glucose-Capillary 165 (*)    All other components within normal limits  URINALYSIS, ROUTINE W REFLEX MICROSCOPIC  TROPONIN I (HIGH SENSITIVITY)  TROPONIN I (HIGH SENSITIVITY)    EKG None  Radiology DG Chest Port 1 View  Result Date: 09/23/2022 CLINICAL DATA:  Upper extremity weakness. EXAM: PORTABLE CHEST 1 VIEW COMPARISON:  06/18/2022 FINDINGS: The heart size and mediastinal contours are within normal limits. Both lungs are clear. The visualized skeletal structures are unremarkable. IMPRESSION: No active disease. Electronically Signed   By: Claudie Revering M.D.   On: 09/23/2022 22:15   CT ANGIO HEAD NECK W WO CM  Result Date: 09/23/2022 CLINICAL DATA:  Follow-up examination for stroke. EXAM: CT ANGIOGRAPHY HEAD AND NECK TECHNIQUE: Multidetector CT imaging of the head and neck was performed using the standard protocol during bolus administration of intravenous contrast. Multiplanar CT image reconstructions and MIPs were obtained to evaluate the vascular anatomy. Carotid stenosis measurements (when applicable) are obtained utilizing NASCET criteria, using the distal internal carotid diameter as the denominator. RADIATION DOSE REDUCTION: This exam was performed according to the departmental dose-optimization program which includes automated exposure control, adjustment of the mA and/or kV according to patient size  and/or use of iterative reconstruction technique. CONTRAST:  5m OMNIPAQUE IOHEXOL 350 MG/ML SOLN COMPARISON:  Prior brain MRI from earlier the same day and CTA from 06/18/2022. FINDINGS: CT HEAD FINDINGS Brain: No acute intracranial hemorrhage or large vessel territory infarct. Chronic right parietal infarct noted. Underlying mild chronic microvascular ischemic changes. No mass lesion, mass effect or midline shift. No hydrocephalus or extra-axial fluid collection. Vascular: No hyperdense vessel. Skull: Scalp soft tissues and calvarium within normal limits. Sinuses/Orbits: Globes orbital soft tissues within normal limits. Paranasal sinuses and mastoid air cells are clear. Other: None. Review of the MIP images confirms the above findings CTA NECK FINDINGS Aortic arch: Visualized aortic arch normal caliber with normal branch pattern. No stenosis about the origin the great vessels. Right carotid system: Right CCA widely patent. Vascular stent traversing  the right carotid bulb again seen. There has been development of mild concentric thrombus/plaque at the mid portion of the stent with progressive stenosis of up to approximately 50% (series 10, image 196). Otherwise, patent flow seen through the stent. Right ICA patent distally without stenosis or dissection. Left carotid system: Left CCA remains widely patent. Atheromatous change about the left carotid bulb/proximal left ICA with associated stenosis of up to 30% by NASCET criteria, stable. Left ICA patent distally without stenosis or dissection. Vertebral arteries: Both vertebral arteries arise from the subclavian arteries. No proximal subclavian artery stenosis. Vertebral arteries patent without stenosis or dissection. Skeleton: No discrete or worrisome osseous lesions. Mild-to-moderate spondylosis noted at C5-6 and C6-7. Other neck: No other acute soft tissue abnormality within the neck. Upper chest: Visualized upper chest demonstrates no acute finding. Review of  the MIP images confirms the above findings CTA HEAD FINDINGS Anterior circulation: Atheromatous change within the carotid siphons with associated mild to moderate narrowing on the right. A1 segments, anterior communicating artery complex common anterior cerebral arteries are widely patent and stable. No M1 stenosis or occlusion. No proximal MCA branch occlusion or stenosis. Distal MCA branches perfused and symmetric. Posterior circulation: Short-segment fenestration of the left V4 segment as it crosses the dural reflection. Both V4 segments widely patent without stenosis. Both PICA patent. Basilar patent to its distal aspect without stenosis. Superior cerebral arteries patent bilaterally. Right PCA supplied via the basilar. Left PCA supplied via a hypoplastic left P1 segment and prominent left posterior communicating artery. Both PCAs patent to their distal aspects without stenosis. Venous sinuses: Patent allowing for timing the contrast bolus. Anatomic variants: As above.  No aneurysm. Review of the MIP images confirms the above findings IMPRESSION: CT HEAD IMPRESSION: 1. No acute intracranial abnormality. 2. Chronic right parietal infarct with underlying mild chronic microvascular ischemic disease. CTA HEAD AND NECK: 1. Negative CTA for large vessel occlusion or other emergent finding. 2. Vascular stent traversing the right carotid bulb. There has been development of mild partially concentric thrombus/plaque at the mid aspect of the stent, with resultant progressive stenosis of up to approximately 50% by NASCET criteria. 3. Atheromatous change about the left carotid bulb/proximal left ICA with associated stenosis of up to 30% by NASCET criteria, stable. 4. Atheromatous change within the carotid siphons with associated mild to moderate narrowing on the right. Electronically Signed   By: Jeannine Boga M.D.   On: 09/23/2022 22:13   MR BRAIN WO CONTRAST  Result Date: 09/23/2022 CLINICAL DATA:  Follow-up  examination for stroke. EXAM: MRI HEAD WITHOUT CONTRAST TECHNIQUE: Multiplanar, multiecho pulse sequences of the brain and surrounding structures were obtained without intravenous contrast. COMPARISON:  Prior study from 06/18/2022. FINDINGS: Brain: Cerebral volume within normal limits. Chronic right parietal infarct noted. Patchy T2/FLAIR hyperintensity involving the supratentorial cerebral white matter, most likely related chronic microvascular ischemic disease, mild in nature. No evidence for acute or subacute ischemia. Gray-white matter differentiation maintained. No acute intracranial hemorrhage. No mass lesion, midline shift or mass effect. No hydrocephalus or extra-axial fluid collection. Pituitary gland and suprasellar region within normal limits. Vascular: Major intracranial vascular flow voids are maintained. Skull and upper cervical spine: Craniocervical junction normal. Bone marrow signal intensity within normal limits. No scalp soft tissue abnormality. Sinuses/Orbits: Globes orbital soft tissues within normal limits. Paranasal sinuses are largely clear. No mastoid effusion. Other: None. IMPRESSION: 1. No acute intracranial abnormality. 2. Chronic right parietal infarct. 3. Underlying mild chronic microvascular ischemic disease. Electronically Signed  By: Jeannine Boga M.D.   On: 09/23/2022 20:42    Procedures Procedures    Medications Ordered in ED Medications  LORazepam (ATIVAN) injection 0.5 mg (0.5 mg Intravenous Given 09/23/22 1908)  iohexol (OMNIPAQUE) 350 MG/ML injection 100 mL (75 mLs Intravenous Contrast Given 09/23/22 2040)    ED Course/ Medical Decision Making/ A&P      CRITICAL CARE Performed by: Milton Ferguson Total critical care time: 50 minutes Critical care time was exclusive of separately billable procedures and treating other patients. Critical care was necessary to treat or prevent imminent or life-threatening deterioration. Critical care was time spent  personally by me on the following activities: development of treatment plan with patient and/or surrogate as well as nursing, discussions with consultants, evaluation of patient's response to treatment, examination of patient, obtaining history from patient or surrogate, ordering and performing treatments and interventions, ordering and review of laboratory studies, ordering and review of radiographic studies, pulse oximetry and re-evaluation of patient's condition.     Patient with a history of stroke and heaviness in her arms.  No new stroke seen on MRI.  CT angio shows 50% occlusion of her stent on the right side.  I spoke to neurology and they stated patient can follow-up with vascular surgery and continue her Plavix and aspirin.  I spoke with vascular surgery and they can see her next couple weeks                         Medical Decision Making Amount and/or Complexity of Data Reviewed Labs: ordered. Radiology: ordered. ECG/medicine tests: ordered.  Risk Prescription drug management.   This patient presents to the ED for concern of heaviness in arms, this involves an extensive number of treatment options, and is a complaint that carries with it a high risk of complications and morbidity.  The differential diagnosis includes stroke, anxiety   Co morbidities that complicate the patient evaluation  CVA   Additional history obtained:  Additional history obtained from patient External records from outside source obtained and reviewed including hospital records   Lab Tests:  I Ordered, and personally interpreted labs.  The pertinent results include: Glucose 161   Imaging Studies ordered:  I ordered imaging studies including MRI of the head and CT angio of the head neck I independently visualized and interpreted imaging which showed right negative.  CT angio shows stent on right carotid 50 minutes and noticed I agree with the radiologist interpretation   Cardiac Monitoring: /  EKG:  The patient was maintained on a cardiac monitor.  I personally viewed and interpreted the cardiac monitored which showed an underlying rhythm of: Sinus rhythm   Consultations Obtained:  I requested consultation with the neurology and vascular surgery,  and discussed lab and imaging findings as well as pertinent plan - they recommend: Continue Plavix and aspirin and follow-up with vascular surgery   Problem List / ED Course / Critical interventions / Medication management  Stroke, weakness upper extremities No medicines ordered Reevaluation of the patient after these medicines showed that the patient improved I have reviewed the patients home medicines and have made adjustments as needed   Social Determinants of Health:  None   Test / Admission - Considered:  No additional test in ED    Weakness in both upper extremities which has resolved and new stenosis of stent in right carotid artery       Final Clinical Impression(s) / ED Diagnoses Final diagnoses:  Weakness    Rx / DC Orders ED Discharge Orders     None         Milton Ferguson, MD 09/25/22 [phone]

## 2022-09-23 NOTE — ED Triage Notes (Signed)
Pt arrived via [facility] with c/o bilateral upper extremity weakness that started 20 minutes ago. Per EMS, pt has a Hx  of a stroke in the past and the bilateral weakness in upper extremities feels similar. Cbg 144, Also per EMS, pt is not exhibiting any other stroke-like Sx

## 2022-09-23 NOTE — ED Notes (Signed)
Patient transported to MRI 

## 2022-09-23 NOTE — Discharge Instructions (Addendum)
Continue taking your Plavix and aspirin.  Your vascular surgeon will contact you so you can get followed up in the next couple weeks with them.  If you have any more problems follow-up with your family doctor

## 2022-09-27 ENCOUNTER — Ambulatory Visit (INDEPENDENT_AMBULATORY_CARE_PROVIDER_SITE_OTHER): Payer: Medicare Other | Admitting: Neurology

## 2022-09-27 DIAGNOSIS — R5383 Other fatigue: Secondary | ICD-10-CM

## 2022-09-27 DIAGNOSIS — G4710 Hypersomnia, unspecified: Secondary | ICD-10-CM

## 2022-09-27 DIAGNOSIS — I6521 Occlusion and stenosis of right carotid artery: Secondary | ICD-10-CM

## 2022-09-27 DIAGNOSIS — E782 Mixed hyperlipidemia: Secondary | ICD-10-CM

## 2022-09-27 DIAGNOSIS — G4719 Other hypersomnia: Secondary | ICD-10-CM

## 2022-09-27 DIAGNOSIS — I63511 Cerebral infarction due to unspecified occlusion or stenosis of right middle cerebral artery: Secondary | ICD-10-CM

## 2022-10-02 ENCOUNTER — Ambulatory Visit (INDEPENDENT_AMBULATORY_CARE_PROVIDER_SITE_OTHER): Payer: Medicare Other | Admitting: Vascular Surgery

## 2022-10-02 ENCOUNTER — Encounter: Payer: Self-pay | Admitting: Vascular Surgery

## 2022-10-02 VITALS — BP 164/73 | HR 54 | Temp 97.9°F | Resp 20 | Ht <= 58 in | Wt 154.9 lb

## 2022-10-02 DIAGNOSIS — I6523 Occlusion and stenosis of bilateral carotid arteries: Secondary | ICD-10-CM | POA: Diagnosis not present

## 2022-10-05 ENCOUNTER — Telehealth: Payer: Self-pay

## 2022-10-05 NOTE — Telephone Encounter (Signed)
     Patient  visit on 11/1  at [facility]   Have you been able to follow up with your primary care physician? YES  The patient was or was not able to obtain any needed medicine or equipment. YES   Are there diet recommendations that you are having difficulty following? NA  Patient expresses understanding of discharge instructions and education provided has no other needs at this time.  YES     Diamond Adams [facility] Care Guide, [place], Care Management  [phone] [address], [place], [place] [address] Phone: [phone] Email: [email or web address]    

## 2022-10-05 NOTE — Progress Notes (Signed)
POST OPERATIVE OFFICE NOTE    CC:  F/u for surgery  HPI:  This is a 61 y.o. female who is s/p 05/28/22 right TCAR for symptomatic ICA stenosis.   She was recently seen in the outpatient setting and found to be doing well.  Site well-healed.  Unfortunate, she was seen in the ED earlier this month with bilateral upper extremity weakness and concern for stroke.  CT angio demonstrated some in-stent stenosis of the right-sided carotid stent for which follow-up was scheduled.  On exam, Kala was doing well, accompanied by her daughter.  She has been relatively inactive at home, laying in her bed for most the day.  She continues to abstain from smoking.  She has had recent weight gain contributing this to smoking cessation as well as lack of exercise.  She denies symptoms of TIA, stroke, amaurosis.  The bilateral weakness in the arms waxes and wanes.  She is medically managed on ASA, Plavix and a daily Statin.    Allergies  Allergen Reactions   Trazodone And Nefazodone Hives    Current Outpatient Medications  Medication Sig Dispense Refill   ALPRAZolam (XANAX) 0.25 MG tablet Take 1 tablet (0.25 mg total) by mouth at bedtime as needed for anxiety. 15 tablet 0   amLODipine (NORVASC) 5 MG tablet Take 5 mg by mouth daily.     aspirin EC 325 MG tablet Take 325 mg by mouth daily.     clopidogrel (PLAVIX) 75 MG tablet Take 75 mg by mouth daily.     FLUoxetine (PROZAC) 10 MG capsule Take 1 capsule (10 mg total) by mouth daily. 30 capsule 0   lisinopril (ZESTRIL) 40 MG tablet Take 40 mg by mouth daily.     pantoprazole (PROTONIX) 40 MG tablet Take 1 tablet (40 mg total) by mouth daily as needed (reflux). 30 tablet 0   lisinopril (ZESTRIL) 20 MG tablet Take 1 tablet (20 mg total) by mouth daily. (Patient not taking: Reported on 10/02/2022) 30 tablet 1   rosuvastatin (CRESTOR) 20 MG tablet Take 1 tablet (20 mg total) by mouth daily. 30 tablet 0   No current facility-administered medications for this  visit.     ROS:  See HPI  Physical Exam:    Incision:  well healed right neck incision Extremities:  Palpable radial pulses B, no facial droop or tongue deviation Neuro: motor, sensation intact all 4 ext Lungs :  non labored breathing       Right Carotid Findings:  +----------+--------+--------+--------+------------------+--------+            PSV cm/sEDV cm/sStenosisPlaque DescriptionComments  +----------+--------+--------+--------+------------------+--------+  CCA Prox  83      17                                          +----------+--------+--------+--------+------------------+--------+  CCA Mid   67      20                                          +----------+--------+--------+--------+------------------+--------+  CCA Distal75      23                                stent     +----------+--------+--------+--------+------------------+--------+  ICA Prox                                            stent     +----------+--------+--------+--------+------------------+--------+  ICA Mid   80      23                                          +----------+--------+--------+--------+------------------+--------+  ICA Distal81      24                                          +----------+--------+--------+--------+------------------+--------+  ECA       247     33                                          +----------+--------+--------+--------+------------------+--------+   +----------+--------+-------+----------------+-------------------+            PSV cm/sEDV cmsDescribe        Arm Pressure (mmHG)  +----------+--------+-------+----------------+-------------------+  [id number]     1      Multiphasic, [id number]                  +----------+--------+-------+----------------+-------------------+   +---------+--------+--+--------+--+---------+  VertebralPSV cm/s45EDV cm/s10Antegrade   +---------+--------+--+--------+--+---------+       Right Stent(s):  +---------------+--+--++++  Prox to Stent  7324  +---------------+--+--++++  Proximal Stent 6721  +---------------+--+--++++  Mid Stent      8022  +---------------+--+--++++  Distal Stent   8020  +---------------+--+--++++  Distal to Stent8219  +---------------+--+--++++      Recent CT angio head and neck reviewed from 09/23/2022 demonstrating some mural thrombus in the right carotid stent.  This is nonocclusive, and does not appear to be critically stenosed.     Assessment/Plan:  This is a 61 y.o. female with Symptomatic right internal carotid artery stenosis who is s/p: 05/2022 right TCAR.  She has moderate stenosis on the left. Since last seen, she has had accelerated new atherosclerosis within the carotid stent.  Mural thrombus formation within the carotid stent.   Diamond Adams and I had a long conversation regarding the above.  She is so afraid she is going to have another stroke she spends most of her day in bed.  She thinks physical exercise will lead to stroke.  She is now taking Xanax twice daily due to her anxiety. I reassured her that exercise, healthy diet, and taking her medications are the best things she can do in an effort to lower her stroke risk.  Being that she has had rapid progression, I plan on seeing Lorree in 11-monthintervals.  I have ordered a new duplex ultrasound to be completed in the coming weeks.  I will call her with the results.  Statin medication increased to high intensity therapy  JBroadus JohnMD [facility] [phone]  Clinic MD:  RVirl Cagey

## 2022-10-06 ENCOUNTER — Ambulatory Visit: Payer: Medicare Other | Admitting: Vascular Surgery

## 2022-10-06 DIAGNOSIS — I10 Essential (primary) hypertension: Secondary | ICD-10-CM | POA: Diagnosis not present

## 2022-10-06 DIAGNOSIS — E785 Hyperlipidemia, unspecified: Secondary | ICD-10-CM | POA: Diagnosis not present

## 2022-10-07 ENCOUNTER — Ambulatory Visit (HOSPITAL_COMMUNITY)
Admission: RE | Admit: 2022-10-07 | Discharge: 2022-10-07 | Disposition: A | Discharge status: Home / Self Care | Payer: Medicare Other | Source: Ambulatory Visit | Attending: Vascular Surgery | Admitting: Vascular Surgery

## 2022-10-07 DIAGNOSIS — I6523 Occlusion and stenosis of bilateral carotid arteries: Secondary | ICD-10-CM | POA: Diagnosis not present

## 2022-10-09 ENCOUNTER — Ambulatory Visit (INDEPENDENT_AMBULATORY_CARE_PROVIDER_SITE_OTHER): Payer: Medicare Other | Admitting: Vascular Surgery

## 2022-10-09 DIAGNOSIS — Z95828 Presence of other vascular implants and grafts: Secondary | ICD-10-CM | POA: Diagnosis not present

## 2022-10-09 DIAGNOSIS — I6523 Occlusion and stenosis of bilateral carotid arteries: Secondary | ICD-10-CM | POA: Diagnosis not present

## 2022-10-09 DIAGNOSIS — G4719 Other hypersomnia: Secondary | ICD-10-CM | POA: Insufficient documentation

## 2022-10-09 NOTE — Procedures (Signed)
[facility] POLYSOMNOGRAPHY  INTERPRETATION REPORT   STUDY DATE:  09/27/2022     PATIENT NAME:  Diamond Adams         DATE OF BIRTH:  04-18-1961  PATIENT ID:  [id number]    TYPE OF STUDY:  PSG  READING PHYSICIAN: Larey Seat, MD REFERRED BY: Stroke MD-Megan Clabe Seal, NP and  Dr Leonie Man, MD. PCP :Marcy Siren Hall,MD.   SCORING TECHNICIAN: Richard Miu, RPSGT    Medical & Medication History    Diamond Adams is a 61 y.o. year old White or Caucasian female patient who was seen upon referral on 09/08/2022 from NP Stroke Service for a sleep evaluation.   Chief concern according to patient: " I am sleepy all the time, I can easily fall asleep when I am at rest and not stimulated. I feel more fatigued, overall weaker and not refreshed in AM. My family confirms I don't snore. I take long naps of 2-3 hours and dream a bit, sleep is fragmented. I had a stroke in 04-2022, my strength in the left face and arm changed" - Last seen by NP for Dr Leonie Man, MD. 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  Xanax, Norvasc, Prozac, Zestril, Protonix, Crestor   ADDITIONAL INFORMATION:  The Epworth Sleepiness Scale was endorsed at 14 /24 points (scores above or equal to 10 are suggestive of hypersomnolence). FSS was endorsed at 49 /63 points. GDS at 7/ 15 points.   Height: 58 in Weight: 154 lbs (BMI 32) Neck Size: 14 "  MEDICATIONS: Xanax (at bedtime) , Norvasc, Prozac, Zestril, Protonix, Crestor  TECHNICAL DESCRIPTION: A registered sleep technologist ( RPSGT)  was in attendance for the duration of the recording.  Data collection, scoring, video monitoring, and reporting were performed in compliance with the AASM Manual for the Scoring of Sleep and Associated Events; (Hypopnea is scored based on the criteria listed in Section VIII D. 1b in the AASM Manual V2.6 using a 4%  oxygen desaturation rule or Hypopnea is scored based on the criteria listed in Section VIII D. 1a in the AASM Manual V2.6 using 3% oxygen desaturation and /or arousal rule).   SLEEP CONTINUITY AND SLEEP ARCHITECTURE:  Lights-out was at 20:32: and lights-on at  05:03:, with  8.5 minutes of recording time . Total sleep time ( TST) was 352.5 minutes with a decreased sleep efficiency at 68.9%.  Sleep latency was increased at 92.5 minutes.  REM sleep latency was increased at 114.0 minutes. Of the total sleep time, the percentage of stage N1 sleep was 6.0%, stage N2 sleep was 72%, stage N3 sleep was 13.3%, and REM sleep was 8.5%. Wake after sleep onset (WASO) time accounted for 66 minutes.  There was only 1 Stage R period observed on this study night, 25 awakenings (i.e. transitions to Stage W from any sleep stage), and 73 total stage transitions. BODY POSITION:  TST was divided between the following sleep positions: Sleep in supine for 39 minutes (11%), in non-supine for 313 minutes (89%); in right lateral position for 164 minutes (47%), left 148 minutes (42%), and prone 00 minutes (0%). Total supine REM sleep time was 00 minutes (0% of total REM sleep).  RESPIRATORY MONITORING:  Based on AASM criteria (using a 3% oxygen desaturation and /or arousal rule for scoring hypopneas), there was 1 apnea (1 obstructive; 0 central; 0 mixed), and 6 hypopneas. Apnea index  was 0.2/h. Hypopnea index was 1.0/h.  The AHI (apnea-hypopnea index) was 1.2/h overall (0.0 supine, 2/h non-supine; 2.0/h REM, 0.0/h supine REM). There were 0 respiratory effort-related arousals (RERAs).   OXIMETRY: Oxyhemoglobin Saturation Nadir during sleep was at 89% from a mean of 94%.  Of the Total sleep time (TST) in hypoxemia (<89%) was 0.0 minutes, or 0.0% of total sleep time.  LIMB MOVEMENTS: There were 0 periodic limb movements of sleep (0.0/h). AROUSAL: There were 54 arousals in total, for an arousal index of 7/hour.  Of these, 3 were  identified as respiratory-related arousals (1 /h), 0 were PLM-related arousals (0 /h), and 51 were non-specific arousals (9 /h). There were 0 occurrences of Cheyne Stokes breathing.   Snoring was classified as mild and non- continuous. EEG:  PSG EEG was of normal amplitude and frequency, without epileptiform discharges. EKG: The electrocardiogram documented NSR. The average heart rate during sleep was only 48 bpm.  The heart rate during sleep varied between a minimum of 42 bpm and a maximum of 68 bpm. AUDIO and VIDEO: No complex movements, phonations or vocalizations were captured.   IMPRESSION: 1) Sleep disordered breathing was not present.  Snoring was very mild and not cause of arousals. Neither were PLMs present.  2) Total sleep time was reduced at 352.5 minutes.  Sleep efficiency was decreased at 68.9%.  Sleep fragmentation was noted during NREM sleep stage 2. Most sleep arousals were spontaneous- meaning due to causes this PSG could not classify or relate to physiologic factors. Possible causes can include pain, anxiety, depression.   3) Bradycardia was intermittently noted, unrelated to oxygen saturation status.   RECOMMENDATIONS: There is no physiological sleep disorder present and no intervention by CPAP or medication is indicated.  A right hemispheric brain stroke is frequently associated with symptoms of increased sleepiness. Operating machinery safely and driving safety can be affected.  Please consider neuropsychological testing if cognitive concerns are present. Please consider evaluation for depression/ anxiety and treatment of these conditions if present.  Overall better sleep may be achieved by changes in sleep hygiene, restriction of nap duration, setting a bedtime and rise time no more than 9 hours apart. Exercise is strongly encouraged - as long or short in duration as tolerated, and to be repeated multiple times a day. Increasing hydration can help to reduce fatigue and  lightheadedness.    Larey Seat, MD Fellow of the AASM and AAN, Board certified in Sleep Medicine and Neurology

## 2022-10-09 NOTE — Progress Notes (Signed)
OFFICE / Phone NOTE   HPI:  This is a 61 y.o. female who is s/p 05/28/22 right TCAR for symptomatic ICA stenosis.    I called her today to follow-up results of her carotid duplex ultrasound.  At her last visit, we discussed her recent CT angio demonstrated accelerated atherosclerosis within the stent.  Fortunately, Diamond Adams has been asymptomatic.  She is not smoking, she has worked to improve her to the level since being seen last week.  We discussed the importance of exercise.   She is medically managed on ASA, Plavix and a daily Statin.    Allergies  Allergen Reactions   Trazodone And Nefazodone Hives    Current Outpatient Medications  Medication Sig Dispense Refill   ALPRAZolam (XANAX) 0.25 MG tablet Take 1 tablet (0.25 mg total) by mouth at bedtime as needed for anxiety. 15 tablet 0   amLODipine (NORVASC) 5 MG tablet Take 5 mg by mouth daily.     aspirin EC 325 MG tablet Take 325 mg by mouth daily.     clopidogrel (PLAVIX) 75 MG tablet Take 75 mg by mouth daily.     FLUoxetine (PROZAC) 10 MG capsule Take 1 capsule (10 mg total) by mouth daily. 30 capsule 0   lisinopril (ZESTRIL) 20 MG tablet Take 1 tablet (20 mg total) by mouth daily. (Patient not taking: Reported on 10/02/2022) 30 tablet 1   lisinopril (ZESTRIL) 40 MG tablet Take 40 mg by mouth daily.     pantoprazole (PROTONIX) 40 MG tablet Take 1 tablet (40 mg total) by mouth daily as needed (reflux). 30 tablet 0   rosuvastatin (CRESTOR) 20 MG tablet Take 1 tablet (20 mg total) by mouth daily. 30 tablet 0   No current facility-administered medications for this visit.     ROS:  See HPI  Physical Exam:    Incision:  well healed right neck incision Extremities:  Palpable radial pulses B, no facial droop or tongue deviation Neuro: motor, sensation intact all 4 ext Lungs :  non labored breathing       Right Carotid Findings:  +----------+--------+--------+--------+------------------+--------+           PSV cm/sEDV  cm/sStenosisPlaque DescriptionComments  +----------+--------+--------+--------+------------------+--------+  CCA Prox  78      18                                          +----------+--------+--------+--------+------------------+--------+  CCA Mid   58      18                                          +----------+--------+--------+--------+------------------+--------+  CCA Distal62      21                                          +----------+--------+--------+--------+------------------+--------+  ICA Prox                                            stent     +----------+--------+--------+--------+------------------+--------+  ICA Mid  stent     +----------+--------+--------+--------+------------------+--------+  ICA Distal98      30                                          +----------+--------+--------+--------+------------------+--------+  ECA      262     36      >50%                                     Right Stent(s):  +---------------+---+--++++  Prox to Stent  66 23  +---------------+---+--++++  Proximal Stent 73 22  +---------------+---+--++++  Mid Stent      11634  +---------------+---+--++++  Distal Stent   13133  +---------------+---+--++++  Distal to [id number]  +---------------+---+--++++         Assessment/Plan:  This is a 61 y.o. female with Symptomatic right internal carotid artery stenosis who is s/p: 05/2022 right TCAR.  She has moderate stenosis on the left.  At her last visit, she had new, accelerated, atherosclerosis within the carotid stent.   Bilateral carotid duplex ultrasound demonstrated no concern for flow-limiting stenosis at this time.  We have increased her statin to high intensity, and I have asked that she continue aspirin and Plavix.  My plan is to see her in 65-monthintervals to follow the stent closely, as well as  address any narrowing that occurs on the contralateral side.   JBroadus JohnMD [facility] [phone]

## 2022-10-12 ENCOUNTER — Telehealth: Payer: Self-pay

## 2022-10-12 ENCOUNTER — Other Ambulatory Visit: Payer: Self-pay

## 2022-10-12 DIAGNOSIS — I739 Peripheral vascular disease, unspecified: Secondary | ICD-10-CM | POA: Diagnosis not present

## 2022-10-12 DIAGNOSIS — R5383 Other fatigue: Secondary | ICD-10-CM | POA: Diagnosis not present

## 2022-10-12 DIAGNOSIS — I6529 Occlusion and stenosis of unspecified carotid artery: Secondary | ICD-10-CM | POA: Diagnosis not present

## 2022-10-12 DIAGNOSIS — K219 Gastro-esophageal reflux disease without esophagitis: Secondary | ICD-10-CM | POA: Diagnosis not present

## 2022-10-12 DIAGNOSIS — R7303 Prediabetes: Secondary | ICD-10-CM | POA: Diagnosis not present

## 2022-10-12 DIAGNOSIS — I10 Essential (primary) hypertension: Secondary | ICD-10-CM | POA: Diagnosis not present

## 2022-10-12 DIAGNOSIS — Z8673 Personal history of transient ischemic attack (TIA), and cerebral infarction without residual deficits: Secondary | ICD-10-CM | POA: Diagnosis not present

## 2022-10-12 DIAGNOSIS — E785 Hyperlipidemia, unspecified: Secondary | ICD-10-CM | POA: Diagnosis not present

## 2022-10-12 DIAGNOSIS — I6523 Occlusion and stenosis of bilateral carotid arteries: Secondary | ICD-10-CM

## 2022-10-12 DIAGNOSIS — G4700 Insomnia, unspecified: Secondary | ICD-10-CM | POA: Diagnosis not present

## 2022-10-12 DIAGNOSIS — Z87891 Personal history of nicotine dependence: Secondary | ICD-10-CM | POA: Diagnosis not present

## 2022-10-12 NOTE — Telephone Encounter (Signed)
-----   Message from Larey Seat, MD sent at 10/09/2022  2:37 PM EST ----- There is no physiological sleep disorder present and no intervention by CPAP or medication is indicated.  No Apnea, no hypoxia, no abnormal limb movements: Nothing that explains the reported excessive fatigue and daytime sleepiness.  1)A right hemispheric brain stroke is frequently associated with symptoms of increased sleepiness. Operating machinery safely and driving safety can be affected.  Please consider neuropsychological testing if cognitive concerns are present. Please consider evaluation for depression/ anxiety and treatment of these conditions if present.  2) Overall better sleep quality may be achieved by changes in sleep hygiene, restriction of nap duration, setting a bedtime and rise time no more than 9 hours apart. 3) Exercise is strongly encouraged - as long or short in duration as tolerated, and to be repeated multiple times a day. 4) Increasing hydration can help to reduce fatigue and lightheadedness.

## 2022-10-12 NOTE — Telephone Encounter (Signed)
I called patient to discuss. No answer, left a message asking her to call us back. If patient calls back another day please route to POD 1.

## 2022-10-12 NOTE — Telephone Encounter (Signed)
Pt is returning a call from nurse. Requesting a call back.  

## 2022-10-12 NOTE — Telephone Encounter (Signed)
I called patient.  I discussed her sleep study results and recommendations.  I advised her to operate machinery safely as driving safety can be affected by her excessive sleepiness.  I advised her to consider neuropsychological testing if cognitive concerns are present.  I recommended an evaluation for depression/anxiety and treatment of these conditions at present.  I advised her that she should work on sleep hygiene, restriction of nap duration, and setting a bedtime and rise time to more than 9 hours apart.  I advised her that exercise is strongly encouraged.  I advised her to increase her hydration which may help reduce her fatigue and lightheadedness.  Patient verbalized understanding of results.  Patient had no further questions or concerns at this time

## 2022-10-23 ENCOUNTER — Ambulatory Visit: Payer: Medicare Other | Admitting: Vascular Surgery

## 2022-11-09 ENCOUNTER — Telehealth: Payer: Self-pay | Admitting: *Deleted

## 2022-11-09 DIAGNOSIS — Z95828 Presence of other vascular implants and grafts: Secondary | ICD-10-CM

## 2022-11-09 MED ORDER — ROSUVASTATIN CALCIUM 40 MG PO TABS: 40.0000 mg | ORAL_TABLET | Freq: Every day | ORAL | 5 refills | Status: AC

## 2022-11-09 NOTE — Telephone Encounter (Signed)
Patient called stating she needed refill on her Crestor. Refill sent to pharmacy.

## 2022-11-30 ENCOUNTER — Ambulatory Visit (INDEPENDENT_AMBULATORY_CARE_PROVIDER_SITE_OTHER): Payer: Medicare Other | Admitting: Internal Medicine

## 2022-11-30 ENCOUNTER — Encounter (INDEPENDENT_AMBULATORY_CARE_PROVIDER_SITE_OTHER): Payer: Self-pay | Admitting: Internal Medicine

## 2022-11-30 VITALS — BP 127/67 | HR 68 | Temp 97.7°F | Ht <= 58 in | Wt 157.0 lb

## 2022-11-30 DIAGNOSIS — E669 Obesity, unspecified: Secondary | ICD-10-CM

## 2022-11-30 DIAGNOSIS — Z6832 Body mass index (BMI) 32.0-32.9, adult: Secondary | ICD-10-CM

## 2022-11-30 DIAGNOSIS — I10 Essential (primary) hypertension: Secondary | ICD-10-CM

## 2022-11-30 DIAGNOSIS — E782 Mixed hyperlipidemia: Secondary | ICD-10-CM | POA: Diagnosis not present

## 2022-11-30 DIAGNOSIS — R7303 Prediabetes: Secondary | ICD-10-CM

## 2022-11-30 DIAGNOSIS — E66811 Obesity, class 1: Secondary | ICD-10-CM | POA: Insufficient documentation

## 2022-11-30 DIAGNOSIS — Z0289 Encounter for other administrative examinations: Secondary | ICD-10-CM

## 2022-11-30 NOTE — Progress Notes (Signed)
Office: [phone]  /  Fax: [phone]   Initial Visit  Diamond Adams was seen in clinic today to evaluate for obesity. She is interested in losing weight to improve overall health and reduce the risk of weight related complications. She presents today to review program treatment options, initial physical assessment, and evaluation.   She was a former smoker whom had a stroke and reports significant weight gain as a result of reduced physical activity from overwhelming fatigue and also increase cravings since quitting smoking.  She was referred by: Friend or Family  When asked what else they would like to accomplish? She states: Adopt healthier eating patterns, Improve energy levels and physical activity, Improve existing medical conditions, and Improve quality of life  When asked how has your weight affected you? She states: Contributed to medical problems, Contributed to orthopedic problems or mobility issues, Having fatigue, Having poor endurance, and Problems with eating patterns  Some associated conditions: Hypertension, Hyperlipidemia, Prediabetes, and Other: History of stroke  Contributing factors: Family history, Disruption of circadian rhythm, Nutritional, Reduced physical activity, Life event, and Menopause  Weight promoting medications identified: None  Current nutrition plan: None  Current level of physical activity: None  Current or previous pharmacotherapy: None  Response to medication: Never tried medications   Past medical history includes:   Past Medical History:  Diagnosis Date   Anxiety disorder    Depression    Fibromyalgia    GERD (gastroesophageal reflux disease)    Herpes    PONV (postoperative nausea and vomiting)    Stroke (HCC)      Objective:   BP 127/67   Pulse 68   Temp 97.7 F (36.5 C)   Ht '4\' 10"'$  (1.473 m)   Wt 157 lb (71.2 kg)   SpO2 97%   BMI 32.81 kg/m  She was weighed on the bioimpedance scale: Body mass index is 32.81  kg/m.  Peak Weight: 168, Body Fat%: 41, Visceral Fat Rating: 11, Weight trend over the last 12 months: Increasing  General:  Alert, oriented and cooperative. Patient is in no acute distress.  Respiratory: Normal respiratory effort, no problems with respiration noted  Extremities: Normal range of motion.    Mental Status: Normal mood and affect. Normal behavior. Normal judgment and thought content.   Assessment and Plan:  1. Class 1 obesity with serious comorbidity and body mass index (BMI) of 32.0 to 32.9 in adult, unspecified obesity type We reviewed weight, biometrics, associated medical conditions and contributing factors with patient. She would benefit from weight loss therapy via a modified calorie, low-carb, high-protein nutritional plan tailored to their REE (resting energy expenditure) which will be determined by indirect calorimetry.  We will also assess for cardiometabolic risk and nutritional derangements via fasting serologies at her next appointment.  2. Essential hypertension Repeat 127/67 and at goal.  On amlodipine and lisinopril.  Recent renal parameters within normal limits.  Continue blood pressure medications, maintain adequate hydration and monitor for orthostasis.  3. Mixed hyperlipidemia LDL cholesterol 55, she has an increased cardiovascular risk and is on high-dose statin therapy without any side effects.  Continue statin therapy  4. Prediabetes Per history.  Most recent A1c less than 5.7.  She did have an elevated blood glucose but this was not fasting.  She will bring in test results from her PCPs office.  Weight loss of 7 to 10% and increasing physical activity will reduce the risk of progression.  Also reducing simple carbs and saturated fats in  her diet.       Obesity Treatment / Action Plan:  Patient will work on garnering support from family and friends to begin weight loss journey. Will work on eliminating or reducing the presence of highly palatable,  calorie dense foods in the home. Will complete provided nutritional and psychosocial assessment questionnaire before the next appointment. Will be scheduled for indirect calorimetry to determine resting energy expenditure in a fasting state.  This will allow [place] to create a reduced calorie, high-protein meal plan to promote loss of fat mass while preserving muscle mass. Will avoid skipping meals which may result in increased hunger signals and overeating at certain times. Counseled on the health benefits of losing 5%-15% of total body weight. Was counseled on nutritional approaches to weight loss and benefits of complex carbs and high quality protein as part of nutritional weight management. Was counseled on pharmacotherapy and role as an adjunct in weight management.   Obesity Education Performed Today:  She was weighed on the bioimpedance scale and results were discussed and documented in the synopsis.  We discussed obesity as a disease and the importance of a more detailed evaluation of all the factors contributing to the disease.  We discussed the importance of long term lifestyle changes which include nutrition, exercise and behavioral modifications as well as the importance of customizing this to her specific health and social needs.  We discussed the benefits of reaching a healthier weight to alleviate the symptoms of existing conditions and reduce the risks of the biomechanical, metabolic and psychological effects of obesity.  Diamond Adams appears to be in the action stage of change and states they are ready to start intensive lifestyle modifications and behavioral modifications.  30 minutes was spent today on this visit including the above counseling, pre-visit chart review, and post-visit documentation.  Reviewed by clinician on day of visit: allergies, medications, problem list, medical history, surgical history, family history, social history, and previous encounter notes.    I  have reviewed the above documentation for accuracy and completeness, and I agree with the above.  Thomes Dinning, MD

## 2022-12-23 ENCOUNTER — Encounter (INDEPENDENT_AMBULATORY_CARE_PROVIDER_SITE_OTHER): Payer: Self-pay | Admitting: Internal Medicine

## 2022-12-23 ENCOUNTER — Ambulatory Visit (INDEPENDENT_AMBULATORY_CARE_PROVIDER_SITE_OTHER): Payer: 59 | Admitting: Internal Medicine

## 2022-12-23 VITALS — BP 124/69 | HR 56 | Temp 97.9°F | Ht <= 58 in | Wt 158.0 lb

## 2022-12-23 DIAGNOSIS — Z87891 Personal history of nicotine dependence: Secondary | ICD-10-CM

## 2022-12-23 DIAGNOSIS — R7303 Prediabetes: Secondary | ICD-10-CM

## 2022-12-23 DIAGNOSIS — Z6832 Body mass index (BMI) 32.0-32.9, adult: Secondary | ICD-10-CM

## 2022-12-23 DIAGNOSIS — E782 Mixed hyperlipidemia: Secondary | ICD-10-CM

## 2022-12-23 DIAGNOSIS — R0602 Shortness of breath: Secondary | ICD-10-CM

## 2022-12-23 DIAGNOSIS — E669 Obesity, unspecified: Secondary | ICD-10-CM

## 2022-12-23 DIAGNOSIS — Z1331 Encounter for screening for depression: Secondary | ICD-10-CM

## 2022-12-23 DIAGNOSIS — R5383 Other fatigue: Secondary | ICD-10-CM

## 2022-12-23 DIAGNOSIS — I10 Essential (primary) hypertension: Secondary | ICD-10-CM

## 2022-12-23 NOTE — Assessment & Plan Note (Signed)
Reviewed labs she had a normal CBC and CMP.  We will repeat TSH.  I think some of her fatigue may be secondary to SSRI and benzodiazepine.  She reports having a sleep study in the past that was negative for sleep apnea.

## 2022-12-23 NOTE — Assessment & Plan Note (Signed)
Her most recent LDL was 67.  She is on high-dose statin therapy with rosuvastatin 40 mg a day.  She denies any adverse effects.  Weight loss of 10% of body weight will improve condition.  Continue statin therapy

## 2022-12-23 NOTE — Progress Notes (Unsigned)
Chief Complaint:   OBESITY Diamond Adams (MR# [id number]) is a 62 y.o. female who presents for evaluation and treatment of obesity and related comorbidities. Current BMI is Body mass index is 33.02 kg/m. Diamond Adams has been struggling with her weight for many years and has been unsuccessful in either losing weight, maintaining weight loss, or reaching her healthy weight goal.  Diamond Adams is currently in the action stage of change and ready to dedicate time achieving and maintaining a healthier weight. Diamond Adams is interested in becoming our patient and working on intensive lifestyle modifications including (but not limited to) diet and exercise for weight loss.  Diamond Adams's habits were reviewed today and are as follows: Her family eats meals together, she thinks her family will eat healthier with her, her desired weight loss is 34-38 lbs, she started gaining weight in 2010, her heaviest weight ever was 170 pounds, she skips meals frequently, she is frequently drinking liquids with calories, she has problems with excessive hunger, she frequently eats larger portions than normal, and she struggles with emotional eating.  Depression Screen Diamond Adams's Food and Mood (modified PHQ-9) score was 19.  Subjective:   1. Other fatigue Diamond Adams admits to daytime somnolence and admits to waking up still tired. Patient has a history of symptoms of daytime fatigue and morning fatigue. Diamond Adams generally gets 6 hours of sleep per night, and states that she has nightime awakenings and generally restful sleep. Snoring is not present. Apneic episodes are not present. Epworth Sleepiness Score is 9. Reviewed labs she had a normal CBC and CMP.  We will repeat TSH.  I think some of her fatigue may be secondary to SSRI and benzodiazepine.  She reports having a sleep study in the past that was negative for sleep apnea.    2. SOB (shortness of breath) on exertion Diamond Adams notes increasing shortness of breath with exercising and seems to be worsening over  time with weight gain. She notes getting out of breath sooner with activity than she used to. This has not gotten worse recently. Diamond Adams denies shortness of breath at rest or orthopnea.  3. Essential hypertension Blood pressure at goal for age and risk category.  On lisinopril 40 mg, amlodipine 5 mg without adverse effects.  Most recent renal parameters reviewed which showed normal electrolytes and kidney function.  Had normal sleep study in the past.  4. Mixed hyperlipidemia Her most recent LDL was 67.  She is on high-dose statin therapy with rosuvastatin 40 mg a day.  She denies any adverse effects.    5. Prediabetes Most recent A1c is 5.9 6 months ago.  Patient informed of disease state and risk of progression. This may contribute to abnormal cravings, fatigue and diabetes complications without having diabetes.   Assessment/Plan:   1. Other fatigue Diamond Adams does feel that her weight is causing her energy to be lower than it should be. Fatigue may be related to obesity, depression or many other causes. Labs will be ordered, and in the meanwhile, Diamond Adams will focus on self care including making healthy food choices, increasing physical activity and focusing on stress reduction.  - TSH  2. SOB (shortness of breath) on exertion Diamond Adams does feel that she gets out of breath more easily that she used to when she exercises. Diamond Adams's shortness of breath appears to be obesity related and exercise induced. She has agreed to work on weight loss and gradually increase exercise to treat her exercise induced shortness of breath. Will continue to monitor  closely.  3. Essential hypertension Continue with weight loss therapy.  Monitor for symptoms of orthostasis while losing weight. Continue current regimen and home monitoring for a goal blood pressure of 120/80.  4. Mixed hyperlipidemia Weight loss of 10% of body weight will improve condition.  Continue statin therapy.  5. Prediabetes We reviewed treatment options  which include weight loss of about 7 to 10% of body weight, increasing physical activity to 150 minutes a week of moderate intensity.  We will check A1c and fasting insulin levels today.  She may also be a candidate for pharmacoprophylaxis with metformin.  - Hemoglobin A1c - Insulin, random  6. Depression screen Diamond Adams had a positive depression screening. Depression is commonly associated with obesity and often results in emotional eating behaviors. We will monitor this closely and work on CBT to help improve the non-hunger eating patterns. Referral to Psychology may be required if no improvement is seen as she continues in our clinic.  7. Class 1 obesity with serious comorbidity and body mass index (BMI) of 32.0 to 32.9 in adult, unspecified obesity type Diamond Adams is currently in the action stage of change and her goal is to continue with weight loss efforts. I recommend Diamond Adams begin the structured treatment plan as follows:  She has agreed to the Category 1 Plan + 100 calories.  Exercise goals: All adults should avoid inactivity. Some physical activity is better than none, and adults who participate in any amount of physical activity gain some health benefits. For substantial health benefits, adults should do at least 150 minutes (2 hours and 30 minutes) a week of moderate-intensity, or 75 minutes (1 hour and 15 minutes) a week of vigorous-intensity aerobic physical activity, or an equivalent combination of moderate- and vigorous-intensity aerobic activity. Aerobic activity should be performed in episodes of at least 10 minutes, and preferably, it should be spread throughout the week.   Behavioral modification strategies: increasing lean protein intake, decreasing simple carbohydrates, increasing vegetables, increasing water intake, decreasing liquid calories, increasing high fiber foods, no skipping meals, meal planning and cooking strategies, keeping healthy foods in the home, better snacking choices,  avoiding temptations, and planning for success.  She was informed of the importance of frequent follow-up visits to maximize her success with intensive lifestyle modifications for her multiple health conditions. She was informed we would discuss her lab results at her next visit unless there is a critical issue that needs to be addressed sooner. Diamond Adams agreed to keep her next visit at the agreed upon time to discuss these results.  Objective:   Blood pressure 124/69, pulse (!) 56, temperature 97.9 F (36.6 C), height 4' 10"$  (1.473 m), weight 158 lb (71.7 kg), SpO2 99 %. Body mass index is 33.02 kg/m.  EKG: Normal sinus rhythm, rate 86 BPM.  Indirect Calorimeter completed today shows a VO2 of 216 and a REE of 1483.  Her calculated basal metabolic rate is 0000000 thus her basal metabolic rate is better than expected.  General: Cooperative, alert, well developed, in no acute distress. HEENT: Conjunctivae and lids unremarkable. Cardiovascular: Regular rhythm.  Lungs: Normal work of breathing. Neurologic: No focal deficits.   Lab Results  Component Value Date   CREATININE 0.86 09/23/2022   BUN 13 09/23/2022   NA 137 09/23/2022   K 3.8 09/23/2022   CL 103 09/23/2022   CO2 24 09/23/2022   Lab Results  Component Value Date   ALT 23 09/23/2022   AST 29 09/23/2022   ALKPHOS 82 09/23/2022  BILITOT 0.5 09/23/2022   Lab Results  Component Value Date   HGBA1C 6.1 (H) 12/23/2022   HGBA1C 5.4 05/18/2022   Lab Results  Component Value Date   INSULIN 37.0 (H) 12/23/2022   Lab Results  Component Value Date   TSH 1.830 12/23/2022   Lab Results  Component Value Date   CHOL 118 05/29/2022   HDL 40 (L) 05/29/2022   LDLCALC 55 05/29/2022   TRIG 113 05/29/2022   CHOLHDL 3.0 05/29/2022   Lab Results  Component Value Date   WBC 11.1 (H) 09/23/2022   HGB 14.5 09/23/2022   HCT 42.7 09/23/2022   MCV 81.8 09/23/2022   PLT 305 09/23/2022   No results found for: "IRON", "TIBC",  "FERRITIN"  Attestation Statements:   Reviewed by clinician on day of visit: allergies, medications, problem list, medical history, surgical history, family history, social history, and previous encounter notes.  Time spent on visit including pre-visit chart review and post-visit charting and care was 40 minutes.   Wilhemena Durie, am acting as transcriptionist for Thomes Dinning, MD.  I have reviewed the above documentation for accuracy and completeness, and I agree with the above. -Thomes Dinning, MD

## 2022-12-23 NOTE — Assessment & Plan Note (Signed)
Most recent A1c is 5.9 6 months ago.  Patient informed of disease state and risk of progression. This may contribute to abnormal cravings, fatigue and diabetes complications without having diabetes.   We reviewed treatment options which include weight loss of about 7 to 10% of body weight, increasing physical activity to 150 minutes a week of moderate intensity.  We will check A1c and fasting insulin levels today.  She may also be a candidate for pharmacoprophylaxis with metformin.

## 2022-12-23 NOTE — Progress Notes (Unsigned)
Office: [phone]  /  Fax: [facility] Height: '4\' 10"'$  (1.473 m) Weight: 158 lb (71.7 kg) Temp: 97.9 F (36.6 C) Pulse Rate: (!) 56 BP: 124/69 SpO2: 99 % Fasting: yes Labs: yes Today's Visit #: 1 Waist Measurement : 44 inches  Body Fat %: 42 % Fat Mass (lbs): 66.6 lbs Muscle Mass (lbs): 87.4 lbs Total Body Water (lbs): 61.8 lbs Visceral Fat Rating : 11 RMR: 1483 Peak Weight: 168 lb Starting Date: 12/23/22 Starting Weight: 158 lb    HPI  Chief Complaint: OBESITY  Diamond Adams is here to discuss her progress with her obesity treatment plan. She is on the {MWMwtlossportion/plan2:23431} and states she is following her eating plan approximately *** % of the time. She states she is exercising *** minutes *** times per week.   Interval History: Since last office she ***   Pharmacotherapy: ***  PHYSICAL EXAM:  Blood pressure 124/69, pulse (!) 56, temperature 97.9 F (36.6 C), height '4\' 10"'$  (1.473 m), weight 158 lb (71.7 kg), SpO2 99 %. Body mass index is 33.02 kg/m.  General: She is overweight, cooperative, alert, well developed, and in no acute distress. PSYCH: Has normal mood, affect and thought process.   HEENT: EOMI, sclerae are anicteric. Lungs: Normal breathing effort, no conversational dyspnea. Extremities: No edema.  Neurologic: No gross sensory or motor deficits. No tremors or fasciculations noted.    DIAGNOSTIC DATA REVIEWED:  BMET    Component Value Date/Time   NA 137 09/23/2022 1910   K 3.8 09/23/2022 1910   CL 103 09/23/2022 1910   CO2 24 09/23/2022 1910   GLUCOSE 161 (H) 09/23/2022 1910   BUN 13 09/23/2022 1910   CREATININE 0.86 09/23/2022 1910   CALCIUM 9.4 09/23/2022 1910   GFRNONAA >60 09/23/2022 1910   GFRAA  09/01/2009 1740    >60        The eGFR has been calculated using the MDRD equation. This calculation has not been validated in all clinical situations. eGFR's persistently <60  mL/min signify possible Chronic Kidney Disease.   Lab Results  Component Value Date   HGBA1C 5.4 05/18/2022   No results found for: "INSULIN" Lab Results  Component Value Date   TSH 1.362 05/18/2022   CBC    Component Value Date/Time   WBC 11.1 (H) 09/23/2022 1910   RBC 5.22 (H) 09/23/2022 1910   HGB 14.5 09/23/2022 1910   HCT 42.7 09/23/2022 1910   PLT 305 09/23/2022 1910   MCV 81.8 09/23/2022 1910   MCH 27.8 09/23/2022 1910   MCHC 34.0 09/23/2022 1910   RDW 12.9 09/23/2022 1910   Iron Studies No results found for: "IRON", "TIBC", "FERRITIN", "IRONPCTSAT" Lipid Panel     Component Value Date/Time   CHOL 118 05/29/2022 0059   TRIG 113 05/29/2022 0059   HDL 40 (L) 05/29/2022 0059   CHOLHDL 3.0 05/29/2022 0059   VLDL 23 05/29/2022 0059   LDLCALC 55 05/29/2022 0059   Hepatic Function Panel     Component Value Date/Time   PROT 7.3 09/23/2022 1910   ALBUMIN 4.3 09/23/2022 1910   AST 29 09/23/2022 1910   ALT 23 09/23/2022 1910   ALKPHOS 82 09/23/2022 1910   BILITOT 0.5 09/23/2022 1910      Component Value Date/Time   TSH 1.362 05/18/2022 0410   Nutritional Lab Results  Component Value Date   VD25OH 36.76 05/22/2022     ASSESSMENT AND PLAN  TREATMENT PLAN FOR  OBESITY:  Recommended Dietary Goals  Diamond Adams is currently in the action stage of change. As such, her goal is to continue weight management plan. She has agreed to {MWMwtlossportion/plan2:23431}.  Behavioral Intervention  We discussed the following Behavioral Modification Strategies today: increasing lean protein intake, decreasing simple carbohydrates , increasing vegetables, avoid skipping meals, think about ways to increase physical activity, avoid or reduce consumption of processed foods, and reading food labels and making healthy choices when eating convenient foods.  Additional resources provided today: NA  Recommended Physical Activity Goals  Diamond Adams has been advised to work up to 150 minutes  of moderate intensity aerobic activity a week and strengthening exercises 2-3 times per week for cardiovascular health, weight loss maintenance and preservation of muscle mass.   She has agreed to {EMEXERCISE:28847::"increase physical activity in their day and reduce sedentary time (increase NEAT). "}   Pharmacotherapy We discussed various medication options to help Diamond Adams with her weight loss efforts and we both agreed to ***.  ASSOCIATED CONDITIONS ADDRESSED TODAY  Other fatigue Assessment & Plan: Reviewed labs she had a normal CBC and CMP.  We will repeat TSH.  I think some of her fatigue may be secondary to SSRI and benzodiazepine.  She reports having a sleep study in the past that was negative for sleep apnea.    Orders: -     TSH  SOB (shortness of breath) on exertion  Depression screen  Essential hypertension Assessment & Plan: Blood pressure at goal for age and risk category.  On lisinopril 40 mg, amlodipine 5 mg without adverse effects.  Most recent renal parameters reviewed which showed normal electrolytes and kidney function.  Had normal sleep study in the past  Continue with weight loss therapy.  Monitor for symptoms of orthostasis while losing weight. Continue current regimen and home monitoring for a goal blood pressure of 120/80.    Mixed hyperlipidemia Assessment & Plan: Her most recent LDL was 67.  She is on high-dose statin therapy with rosuvastatin 40 mg a day.  She denies any adverse effects.  Weight loss of 10% of body weight will improve condition.  Continue statin therapy   Prediabetes Assessment & Plan: Most recent A1c is 5.9 6 months ago.  Patient informed of disease state and risk of progression. This may contribute to abnormal cravings, fatigue and diabetes complications without having diabetes.   We reviewed treatment options which include weight loss of about 7 to 10% of body weight, increasing physical activity to 150 minutes a week of moderate  intensity.  We will check A1c and fasting insulin levels today.  She may also be a candidate for pharmacoprophylaxis with metformin.   Orders: -     Hemoglobin A1c -     Insulin, random  Class 1 obesity with serious comorbidity and body mass index (BMI) of 32.0 to 32.9 in adult, unspecified obesity type      No follow-ups on file.Marland Kitchen She was informed of the importance of frequent follow up visits to maximize her success with intensive lifestyle modifications for her multiple health conditions.   ATTESTASTION STATEMENTS:  Reviewed by clinician on day of visit: allergies, medications, problem list, medical history, surgical history, family history, social history, and previous encounter notes.   Time spent on visit including pre-visit chart review and post-visit care and charting was *** minutes.    Thomes Dinning, MD

## 2022-12-23 NOTE — Assessment & Plan Note (Signed)
Blood pressure at goal for age and risk category.  On lisinopril 40 mg, amlodipine 5 mg without adverse effects.  Most recent renal parameters reviewed which showed normal electrolytes and kidney function.  Had normal sleep study in the past  Continue with weight loss therapy.  Monitor for symptoms of orthostasis while losing weight. Continue current regimen and home monitoring for a goal blood pressure of 120/80.

## 2022-12-24 LAB — HEMOGLOBIN A1C
Est. average glucose Bld gHb Est-mCnc: 128 mg/dL
Hgb A1c MFr Bld: 6.1 % — ABNORMAL HIGH (ref 4.8–5.6)

## 2022-12-24 LAB — TSH: TSH: 1.83 u[IU]/mL (ref 0.450–4.500)

## 2022-12-24 LAB — INSULIN, RANDOM: INSULIN: 37 u[IU]/mL — ABNORMAL HIGH (ref 2.6–24.9)

## 2022-12-25 ENCOUNTER — Ambulatory Visit: Admission: EM | Admit: 2022-12-25 | Payer: Self-pay

## 2022-12-25 ENCOUNTER — Ambulatory Visit
Admission: EM | Admit: 2022-12-25 | Discharge: 2022-12-25 | Disposition: A | Discharge status: Home / Self Care | Payer: 59 | Attending: Nurse Practitioner | Admitting: Nurse Practitioner

## 2022-12-25 ENCOUNTER — Other Ambulatory Visit: Payer: Self-pay

## 2022-12-25 ENCOUNTER — Encounter: Payer: Self-pay | Admitting: Emergency Medicine

## 2022-12-25 DIAGNOSIS — J069 Acute upper respiratory infection, unspecified: Secondary | ICD-10-CM

## 2022-12-25 DIAGNOSIS — Z1152 Encounter for screening for COVID-19: Secondary | ICD-10-CM

## 2022-12-25 MED ORDER — BENZONATATE 100 MG PO CAPS: 100.0000 mg | ORAL_CAPSULE | Freq: Three times a day (TID) | ORAL | 0 refills | Status: DC | PRN

## 2022-12-25 MED ORDER — MOLNUPIRAVIR EUA 200MG CAPSULE: 4.0000 | ORAL_CAPSULE | Freq: Two times a day (BID) | ORAL | 0 refills | Status: AC

## 2022-12-25 NOTE — ED Provider Notes (Signed)
[facility]    CSN: [id number] Arrival date & time: 12/25/22  1503      History   Chief Complaint Chief Complaint  Patient presents with   Sore Throat    HPI Diamond Adams is a 62 y.o. female.   Patient presents today for 1 day history of bodyaches, hot and cold chills alternating with hot sweats, slight cough, nasal congestion and sore throat, nausea that has not improved, decreased appetite, loss of taste, decreased energy levels, weakness, and dizziness/lightheadedness.  Patient denies shortness of breath or chest pain, runny nose, headache, ear pain, abdominal pain, vomiting.  Also denies known sick contacts.  Has been taking Tylenol for symptoms which helped a little bit.  Patient denies history of chronic lung disease.  Reports she is nervous because she has had a stroke in the past.    Past Medical History:  Diagnosis Date   Anxiety disorder    Atherosclerosis    COPD (chronic obstructive pulmonary disease) (HCC)    Depression    Fibromyalgia    GERD (gastroesophageal reflux disease)    Herpes    Hypercholesterolemia    Hypertension    Neuropathy    Optic atrophy    PONV (postoperative nausea and vomiting)    Prediabetes    Senile purpura ([place])    Stroke [facility])     Patient Active Problem List   Diagnosis Date Noted   SOB (shortness of breath) on exertion 12/23/2022   Depression screen 12/23/2022   Class 1 obesity with serious comorbidity and body mass index (BMI) of 32.0 to 32.9 in adult 11/30/2022   Prediabetes 11/30/2022   Excessive daytime sleepiness 10/09/2022   Carotid artery stenosis, symptomatic, right 05/28/2022   Anxiety state    Bruising 05/21/2022   Essential hypertension 05/20/2022   Other fatigue 05/18/2022   Hypokalemia 05/18/2022   Depression 05/18/2022   HLD (hyperlipidemia) 05/18/2022   Acute ischemic right MCA stroke ([place]) 05/18/2022   Tobacco use disorder 05/18/2022   Leukocytosis 05/18/2022    Past Surgical History:   Procedure Laterality Date   ABDOMINAL HYSTERECTOMY  1994   AORTIC ARCH ANGIOGRAPHY N/A 05/20/2022   Procedure: AORTIC ARCH ANGIOGRAPHY;  Surgeon: Broadus John, MD;  Location: [place] [facility];  Service: Cardiovascular;  Laterality: N/A;   CAROTID ANGIOGRAPHY N/A 05/20/2022   Procedure: CAROTID ANGIOGRAPHY;  Surgeon: Broadus John, MD;  Location: [place] [facility];  Service: Cardiovascular;  Laterality: N/A;   CARPAL TUNNEL RELEASE  2013,2011   right and left   EXCISION NASAL MASS Left 09/25/2013   Procedure: EXCISION NASAL CANCER WITH SPLIT THICKNESS SKIN GRAFT FROM RIGHT THIGH;  Surgeon: Ascencion Dike, MD;  Location: [place];  Service: ENT;  Laterality: Left;   RIGHT OOPHORECTOMY     THUMB ARTHROSCOPY     right   TONSILLECTOMY     TRANSCAROTID ARTERY REVASCULARIZATION  Right 05/28/2022   Procedure: Transcarotid Artery Revascularization;  Surgeon: Broadus John, MD;  Location: [facility] OR;  Service: Vascular;  Laterality: Right;   TUBAL LIGATION      OB History     Gravida  3   Para  3   Term      Preterm      AB      Living  3      SAB      IAB      Ectopic      Multiple  Live Births               Home Medications    Prior to Admission medications   Medication Sig Start Date End Date Taking? Authorizing Provider  benzonatate (TESSALON) 100 MG capsule Take 1 capsule (100 mg total) by mouth 3 (three) times daily as needed for cough. Do not take with alcohol or while driving or operating heavy machinery.  May cause drowsiness. 12/25/22  Yes Eulogio Bear, NP  molnupiravir EUA (LAGEVRIO) 200 mg CAPS capsule Take 4 capsules (800 mg total) by mouth 2 (two) times daily for 5 days. 12/25/22 12/30/22 Yes Eulogio Bear, NP  ALPRAZolam Duanne Moron) 0.25 MG tablet Take 1 tablet (0.25 mg total) by mouth at bedtime as needed for anxiety. 05/26/22   Setzer, Edman Circle, PA-C  amLODipine (NORVASC) 5 MG tablet Take 5 mg by mouth daily. 07/01/22   [provider]  aspirin EC 325 MG tablet Take 325 mg by mouth daily.    [provider]  clopidogrel (PLAVIX) 75 MG tablet Take 75 mg by mouth daily. 09/01/22   [provider]  FLUoxetine (PROZAC) 10 MG capsule Take 1 capsule (10 mg total) by mouth daily. 05/26/22   Setzer, Edman Circle, PA-C  lisinopril (ZESTRIL) 20 MG tablet Take 1 tablet (20 mg total) by mouth daily. Patient not taking: Reported on 10/02/2022 06/19/22   Noemi Chapel, MD  lisinopril (ZESTRIL) 40 MG tablet Take 40 mg by mouth daily. 06/22/22   [provider]  pantoprazole (PROTONIX) 40 MG tablet Take 1 tablet (40 mg total) by mouth daily as needed (reflux). 05/26/22   Setzer, Edman Circle, PA-C  rosuvastatin (CRESTOR) 40 MG tablet Take 1 tablet (40 mg total) by mouth daily. 11/09/22   Broadus John, MD    Family History Family History  Problem Relation Age of Onset   Emphysema Mother    Obesity Mother    Heart disease Father    High blood pressure Father    Cancer Sister        cervical   Stroke Neg Hx     Social History Social History   Tobacco Use   Smoking status: Former    Packs/day: 0.50    Types: Cigarettes    Quit date: 05/17/2022    Years since quitting: 0.6   Smokeless tobacco: Never   Tobacco comments:    Last time smoking was 05/20/22.  Vaping Use   Vaping Use: Never used  Substance Use Topics   Alcohol use: No   Drug use: No     Allergies   Trazodone and nefazodone   Review of Systems Review of Systems Per HPI  Physical Exam Triage Vital Signs ED Triage Vitals  Enc Vitals Group     BP 12/25/22 1509 104/67     Pulse Rate 12/25/22 1509 77     Resp 12/25/22 1509 (!) 22     Temp 12/25/22 1509 97.8 F (36.6 C)     Temp Source 12/25/22 1509 Oral     SpO2 12/25/22 1509 96 %     Weight --      Height --      Head Circumference --      Peak Flow --      Pain Score 12/25/22 1507 2     Pain Loc --      Pain Edu? --      Excl. in [place]? --    No data  found.  Updated  Vital Signs BP 104/67 (BP Location: Right Arm)   Pulse 77   Temp 97.8 F (36.6 C) (Oral)   Resp (!) 22   SpO2 96%   Visual Acuity Right Eye Distance:   Left Eye Distance:   Bilateral Distance:    Right Eye Near:   Left Eye Near:    Bilateral Near:     Physical Exam Vitals and nursing note reviewed.  Constitutional:      General: She is not in acute distress.    Appearance: Normal appearance. She is ill-appearing. She is not toxic-appearing.  HENT:     Head: Normocephalic and atraumatic.     Right Ear: Tympanic membrane, ear canal and external ear normal.     Left Ear: Tympanic membrane, ear canal and external ear normal.     Nose: No congestion or rhinorrhea.     Mouth/Throat:     Mouth: Mucous membranes are moist.     Pharynx: Oropharynx is clear. No oropharyngeal exudate or posterior oropharyngeal erythema.  Eyes:     General: No scleral icterus.    Extraocular Movements: Extraocular movements intact.  Cardiovascular:     Rate and Rhythm: Normal rate and regular rhythm.  Pulmonary:     Effort: Pulmonary effort is normal. No respiratory distress.     Breath sounds: Normal breath sounds. No wheezing, rhonchi or rales.  Abdominal:     General: Abdomen is flat. Bowel sounds are normal. There is no distension.     Palpations: Abdomen is soft.     Tenderness: There is no abdominal tenderness.  Musculoskeletal:     Cervical back: Normal range of motion and neck supple.  Lymphadenopathy:     Cervical: No cervical adenopathy.  Skin:    General: Skin is warm and dry.     Coloration: Skin is not jaundiced or pale.     Findings: No erythema or rash.  Neurological:     Mental Status: She is alert and oriented to person, place, and time.  Psychiatric:        Behavior: Behavior is cooperative.      UC Treatments / Results  Labs (all labs ordered are listed, but only abnormal results are displayed) Labs Reviewed  SARS CORONAVIRUS 2 (TAT 6-24 HRS)     EKG   Radiology No results found.  Procedures Procedures (including critical care time)  Medications Ordered in UC Medications - No data to display  Initial Impression / Assessment and Plan / UC Course  I have reviewed the triage vital signs and the nursing notes.  Pertinent labs & imaging results that were available during my care of the patient were reviewed by me and considered in my medical decision making (see chart for details).   Patient is well-appearing, normotensive, afebrile, not tachycardic, oxygenating well on room air.  Patient is mildly tachypneic in triage.  She is talking in complete sentences without accessory muscle use and is not in acute respiratory distress.  1. Encounter for screening for COVID-19 2. Viral URI with cough Suspect viral etiology I am suspicious for COVID-19 Testing obtained today; I sent in a prescription for molnupiravir if she tests positive to start on Supportive care discussed with patient Start Tessalon Perles as needed for dry cough Encourage plenty of hydration Seek care for persistent or worsening symptoms despite treatment  The patient was given the opportunity to ask questions.  All questions answered to their satisfaction.  The patient is in agreement to this plan.  Final Clinical Impressions(s) / UC Diagnoses   Final diagnoses:  Encounter for screening for COVID-19  Viral URI with cough     Discharge Instructions      You have a viral upper respiratory infection.  Symptoms should improve over the next week to 10 days.  If you develop chest pain or shortness of breath, go to the emergency room.  We have tested you today for COVID-19.  You will see the results in Mychart and we will call you with positive results.  Please stay home and isolate until you are aware of the results.    Some things that can make you feel better are: - Increased rest - Increasing fluid with water/sugar free electrolytes - Acetaminophen  as needed for fever/pain - Salt water gargling, chloraseptic spray and throat lozenges for sore throat - OTC guaifenesin (Mucinex) 600 mg twice daily for congestion - Saline sinus flushes or a neti pot - Humidifying the air -Tessalon Perles every 8 hours as needed for dry cough     ED Prescriptions     Medication Sig Dispense Auth. Provider   benzonatate (TESSALON) 100 MG capsule Take 1 capsule (100 mg total) by mouth 3 (three) times daily as needed for cough. Do not take with alcohol or while driving or operating heavy machinery.  May cause drowsiness. 21 capsule Noemi Chapel A, NP   molnupiravir EUA (LAGEVRIO) 200 mg CAPS capsule Take 4 capsules (800 mg total) by mouth 2 (two) times daily for 5 days. 40 capsule Eulogio Bear, NP      PDMP not reviewed this encounter.   Eulogio Bear, NP 12/25/22 [phone]

## 2022-12-25 NOTE — ED Triage Notes (Signed)
Pt reports intermittent chills, sore throat, headache. Pt reports diarrhea started today. Denies any known exposures.

## 2022-12-25 NOTE — Discharge Instructions (Addendum)
You have a viral upper respiratory infection.  Symptoms should improve over the next week to 10 days.  If you develop chest pain or shortness of breath, go to the emergency room.  We have tested you today for COVID-19.  You will see the results in Mychart and we will call you with positive results.  Please stay home and isolate until you are aware of the results.    Some things that can make you feel better are: - Increased rest - Increasing fluid with water/sugar free electrolytes - Acetaminophen as needed for fever/pain - Salt water gargling, chloraseptic spray and throat lozenges for sore throat - OTC guaifenesin (Mucinex) 600 mg twice daily for congestion - Saline sinus flushes or a neti pot - Humidifying the air -Tessalon Perles every 8 hours as needed for dry cough

## 2022-12-26 LAB — SARS CORONAVIRUS 2 (TAT 6-24 HRS): SARS Coronavirus 2: POSITIVE — AB

## 2022-12-30 DIAGNOSIS — M6281 Muscle weakness (generalized): Secondary | ICD-10-CM | POA: Diagnosis not present

## 2022-12-30 DIAGNOSIS — R252 Cramp and spasm: Secondary | ICD-10-CM | POA: Diagnosis not present

## 2022-12-30 DIAGNOSIS — R5382 Chronic fatigue, unspecified: Secondary | ICD-10-CM | POA: Diagnosis not present

## 2022-12-30 DIAGNOSIS — R5383 Other fatigue: Secondary | ICD-10-CM | POA: Diagnosis not present

## 2023-01-04 ENCOUNTER — Ambulatory Visit: Payer: 59 | Admitting: Adult Health

## 2023-01-04 ENCOUNTER — Encounter: Payer: Self-pay | Admitting: Adult Health

## 2023-01-06 ENCOUNTER — Ambulatory Visit (INDEPENDENT_AMBULATORY_CARE_PROVIDER_SITE_OTHER): Payer: 59 | Admitting: Internal Medicine

## 2023-01-20 DIAGNOSIS — H538 Other visual disturbances: Secondary | ICD-10-CM | POA: Diagnosis not present

## 2023-01-20 DIAGNOSIS — H47293 Other optic atrophy, bilateral: Secondary | ICD-10-CM | POA: Diagnosis not present

## 2023-01-21 ENCOUNTER — Other Ambulatory Visit (HOSPITAL_COMMUNITY): Payer: Self-pay | Admitting: Neuro-Ophthalmology

## 2023-01-21 DIAGNOSIS — H538 Other visual disturbances: Secondary | ICD-10-CM

## 2023-02-02 ENCOUNTER — Encounter (INDEPENDENT_AMBULATORY_CARE_PROVIDER_SITE_OTHER): Payer: Self-pay | Admitting: Internal Medicine

## 2023-02-02 ENCOUNTER — Ambulatory Visit (INDEPENDENT_AMBULATORY_CARE_PROVIDER_SITE_OTHER): Payer: 59 | Admitting: Internal Medicine

## 2023-02-02 VITALS — BP 119/68 | HR 57 | Temp 97.6°F | Ht <= 58 in | Wt 157.0 lb

## 2023-02-02 DIAGNOSIS — E669 Obesity, unspecified: Secondary | ICD-10-CM

## 2023-02-02 DIAGNOSIS — E7800 Pure hypercholesterolemia, unspecified: Secondary | ICD-10-CM | POA: Diagnosis not present

## 2023-02-02 DIAGNOSIS — I10 Essential (primary) hypertension: Secondary | ICD-10-CM | POA: Diagnosis not present

## 2023-02-02 DIAGNOSIS — R7303 Prediabetes: Secondary | ICD-10-CM | POA: Diagnosis not present

## 2023-02-02 DIAGNOSIS — Z6832 Body mass index (BMI) 32.0-32.9, adult: Secondary | ICD-10-CM

## 2023-02-02 MED ORDER — METFORMIN HCL ER 500 MG PO TB24: 500.0000 mg | ORAL_TABLET | Freq: Every day | ORAL | 0 refills | Status: DC

## 2023-02-02 NOTE — Progress Notes (Unsigned)
Office: [phone]  /  Fax: [phone]  WEIGHT SUMMARY AND BIOMETRICS  Vitals Temp: 97.6 F (36.4 C) BP: 119/68 Pulse Rate: (!) 57 SpO2: 99 %   Anthropometric Measurements Height: '4\' 10"'$  (1.473 m) Weight: 157 lb (71.2 kg) BMI (Calculated): 32.82 Weight at Last Visit: 158 Weight Lost Since Last Visit: 1 lb Starting Weight: 157 Total Weight Loss (lbs): 1 lb (0.454 kg) Peak Weight: 168 lb   Body Composition  Body Fat %: 40.4 % Fat Mass (lbs): 63.6 lbs Muscle Mass (lbs): 89 lbs Total Body Water (lbs): 60.4 lbs Visceral Fat Rating : 11    HPI  Chief Complaint: OBESITY  Diamond Adams is here to discuss her progress with her obesity treatment plan. She is on the the Category 1 Plan and states she is following her eating plan approximately 0 % of the time. She states she is exercising 10 minutes 2 times per week.  Interval History:  Since last office visit she has 1 lb. She reports has not implemented reduced calorie nutritional plan due to recent infection with COVID She has been working on joining [employer]. Will begin walking on treadmill. '[x]'$ Denies '[]'$ Reports problems with appetite and hunger signals.  '[x]'$ Denies '[]'$ Reports problems with satiety and satiation.  '[x]'$ Denies '[]'$ Reports problems with eating patterns and portion control.  '[x]'$ Denies '[]'$ Reports abnormal cravings Barriers identified none, inability to cook healthy meals, inability do do meal prep and planning, and inability to focus on healthy eating.  24 HR- skips breakfast, Lunch: tuna salad with mayo, pickles and boiled egg. Dinner: Harriet Masson helper or baked chicken, meatloaf, beef stew (pot roast), sauteed tomatoes, peppers and olive oil.  Pharmacotherapy for weight loss: She is currently taking no anti-obesity medication.   Weight promoting medications identified: None.  ASSESSMENT AND PLAN  TREATMENT PLAN FOR OBESITY:  Recommended Dietary Goals  Diamond Adams is currently in the action stage of change. As such,  her goal is to continue weight management plan. She has agreed to: the Category 1 Plan.  Behavioral Intervention  We discussed the following Behavioral Modification Strategies today: increasing lean protein intake, decreasing simple carbohydrates , increasing vegetables, increasing water intake, identifying sources and decreasing liquid calories, work on meal planning and easy cooking plans, decreasing eating out, consumption of processed foods, and making healthy choices when eating convenient foods, and reading food labels .  Additional resources provided today: Handout on healthy eating and balanced plate and Handout on complex carbohydrates and lean sources of protein  Recommended Physical Activity Goals  Verva has been advised to work up to 150 minutes of moderate intensity aerobic activity a week and strengthening exercises 2-3 times per week for cardiovascular health, weight loss maintenance and preservation of muscle mass.   She has agreed to :  '[]'$  Continue current level of physical activity  '[]'$  Think about ways to increase physical activity '[]'$  Start strengthening exercises with a goal of 2-3 sessions a week  '[x]'$  Start aerobic activity with a goal of 150 minutes a week at moderate intensity.  '[]'$  Increase the intensity, frequency or duration of strengthening exercises  '[]'$  Increase the intensity, frequency or duration of aerobic exercises   '[]'$  Increase physical activity in their day and reduce sedentary time (increase NEAT). '[]'$  Work on scheduling and tracking physical activity.   Pharmacotherapy We discussed various medication options to help Reeshemah with her weight loss efforts and we both agreed to :  Start metformin for incretin effect  ASSOCIATED CONDITIONS ADDRESSED TODAY  There are no diagnoses  linked to this encounter.   PHYSICAL EXAM:  Blood pressure 119/68, pulse (!) 57, temperature 97.6 F (36.4 C), height '4\' 10"'$  (1.473 m), weight 157 lb (71.2 kg), SpO2 99 %. Body mass  index is 32.81 kg/m.  General: She is overweight, cooperative, alert, well developed, and in no acute distress. PSYCH: Has normal mood, affect and thought process.   HEENT: EOMI, sclerae are anicteric. Lungs: Normal breathing effort, no conversational dyspnea. Extremities: No edema.  Neurologic: No gross sensory or motor deficits. No tremors or fasciculations noted.    DIAGNOSTIC DATA REVIEWED:  BMET    Component Value Date/Time   NA 137 09/23/2022 1910   K 3.8 09/23/2022 1910   CL 103 09/23/2022 1910   CO2 24 09/23/2022 1910   GLUCOSE 161 (H) 09/23/2022 1910   BUN 13 09/23/2022 1910   CREATININE 0.86 09/23/2022 1910   CALCIUM 9.4 09/23/2022 1910   GFRNONAA >60 09/23/2022 1910   GFRAA  09/01/2009 1740    >60        The eGFR has been calculated using the MDRD equation. This calculation has not been validated in all clinical situations. eGFR's persistently <60 mL/min signify possible Chronic Kidney Disease.   Lab Results  Component Value Date   HGBA1C 6.1 (H) 12/23/2022   HGBA1C 5.4 05/18/2022   Lab Results  Component Value Date   INSULIN 37.0 (H) 12/23/2022   Lab Results  Component Value Date   TSH 1.362 05/18/2022   CBC    Component Value Date/Time   WBC 11.1 (H) 09/23/2022 1910   RBC 5.22 (H) 09/23/2022 1910   HGB 14.5 09/23/2022 1910   HCT 42.7 09/23/2022 1910   PLT 305 09/23/2022 1910   MCV 81.8 09/23/2022 1910   MCH 27.8 09/23/2022 1910   MCHC 34.0 09/23/2022 1910   RDW 12.9 09/23/2022 1910   Iron Studies No results found for: "IRON", "TIBC", "FERRITIN", "IRONPCTSAT" Lipid Panel     Component Value Date/Time   CHOL 118 05/29/2022 0059   TRIG 113 05/29/2022 0059   HDL 40 (L) 05/29/2022 0059   CHOLHDL 3.0 05/29/2022 0059   VLDL 23 05/29/2022 0059   LDLCALC 55 05/29/2022 0059   Hepatic Function Panel     Component Value Date/Time   PROT 7.3 09/23/2022 1910   ALBUMIN 4.3 09/23/2022 1910   AST 29 09/23/2022 1910   ALT 23 09/23/2022 1910    ALKPHOS 82 09/23/2022 1910   BILITOT 0.5 09/23/2022 1910      Component Value Date/Time   TSH 1.362 05/18/2022 0410   Nutritional Lab Results  Component Value Date   VD25OH 36.76 05/22/2022     No follow-ups on file.Marland Kitchen She was informed of the importance of frequent follow up visits to maximize her success with intensive lifestyle modifications for her multiple health conditions.   ATTESTASTION STATEMENTS:  Reviewed by clinician on day of visit: allergies, medications, problem list, medical history, surgical history, family history, social history, and previous encounter notes.   Time spent on visit including pre-visit chart review and post-visit care and charting was *** minutes.    Thomes Dinning, MD

## 2023-02-03 NOTE — Assessment & Plan Note (Signed)
Blood pressure at goal for age and risk category.  On lisinopril 40 mg, amlodipine 5 mg.  Orthostatic dizziness recently without syncope.  Most recent renal parameters reviewed which showed normal electrolytes and kidney function.  Had normal sleep study in the past.   Recommend she schedule an appointment with her PCP to address orthostasis.  Will check orthostatics today and they are within normal limits.  Counseled on maintaining adequate hydration and on fall prevention.  Continue with weight loss therapy. Continue current regimen and home monitoring for a goal blood pressure of 120/80.

## 2023-02-03 NOTE — Assessment & Plan Note (Signed)
Most recent A1c is 5.9  6 months ago.  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  Patient informed of disease state and risk of progression. This may contribute to abnormal cravings, fatigue and diabetes complications without having diabetes.   We reviewed treatment options which include weight loss of about 7 to 10% of body weight, increasing physical activity to 150 minutes a week of moderate intensity.  After discussion of benefits and side effects and shared decision making she is agreeable to starting metformin XR 500 mg 1 tablet daily for diabetes prevention and incretin effect.

## 2023-02-03 NOTE — Assessment & Plan Note (Signed)
LDL is at goal. Elevated LDL may be secondary to nutrition, genetics and spillover effect from excess adiposity. Recommended LDL goal is <70 to reduce the risk of fatty streaks and the progression to obstructive ASCVD in the future. Her 10 year risk is: The ASCVD Risk score (Arnett DK, et al., 2019) failed to calculate for the following reasons:   The patient has a prior MI or stroke diagnosis  Lab Results  Component Value Date   CHOL 118 05/29/2022   HDL 40 (L) 05/29/2022   LDLCALC 55 05/29/2022   TRIG 113 05/29/2022   CHOLHDL 3.0 05/29/2022    She is currently on high intensity statin therapy without any adverse effects and benefits from ongoing therapy.  Continue weight loss therapy, losing 10% or more of body weight may improve condition. Also advised to reduce saturated fats in diet to less than 10% of daily calories.

## 2023-02-04 DIAGNOSIS — R002 Palpitations: Secondary | ICD-10-CM | POA: Diagnosis not present

## 2023-02-17 ENCOUNTER — Ambulatory Visit (HOSPITAL_COMMUNITY)
Admission: RE | Admit: 2023-02-17 | Discharge: 2023-02-17 | Disposition: A | Discharge status: Home / Self Care | Payer: 59 | Source: Ambulatory Visit | Attending: Neuro-Ophthalmology | Admitting: Neuro-Ophthalmology

## 2023-02-17 ENCOUNTER — Encounter: Payer: Self-pay | Admitting: *Deleted

## 2023-02-17 DIAGNOSIS — Z8673 Personal history of transient ischemic attack (TIA), and cerebral infarction without residual deficits: Secondary | ICD-10-CM | POA: Insufficient documentation

## 2023-02-17 DIAGNOSIS — I6782 Cerebral ischemia: Secondary | ICD-10-CM | POA: Insufficient documentation

## 2023-02-17 DIAGNOSIS — H4720 Unspecified optic atrophy: Secondary | ICD-10-CM | POA: Insufficient documentation

## 2023-02-17 DIAGNOSIS — J392 Other diseases of pharynx: Secondary | ICD-10-CM | POA: Diagnosis not present

## 2023-02-17 DIAGNOSIS — H538 Other visual disturbances: Secondary | ICD-10-CM | POA: Diagnosis not present

## 2023-02-17 MED ORDER — GADOBUTROL 1 MMOL/ML IV SOLN
7.0000 mL | Freq: Once | INTRAVENOUS | Status: AC | PRN
  Administered 2023-02-17: 7 mL via INTRAVENOUS

## 2023-02-18 ENCOUNTER — Other Ambulatory Visit: Payer: Self-pay | Admitting: Internal Medicine

## 2023-02-18 ENCOUNTER — Encounter: Payer: Self-pay | Admitting: Internal Medicine

## 2023-02-18 ENCOUNTER — Encounter: Payer: Self-pay | Admitting: Adult Health

## 2023-02-18 ENCOUNTER — Encounter: Payer: Self-pay | Admitting: Vascular Surgery

## 2023-02-18 ENCOUNTER — Telehealth: Payer: Self-pay | Admitting: Internal Medicine

## 2023-02-18 ENCOUNTER — Ambulatory Visit: Payer: 59 | Attending: Internal Medicine | Admitting: Internal Medicine

## 2023-02-18 ENCOUNTER — Ambulatory Visit (INDEPENDENT_AMBULATORY_CARE_PROVIDER_SITE_OTHER): Payer: 59

## 2023-02-18 VITALS — BP 122/80 | HR 62 | Ht <= 58 in | Wt 161.2 lb

## 2023-02-18 DIAGNOSIS — R002 Palpitations: Secondary | ICD-10-CM

## 2023-02-18 DIAGNOSIS — R0609 Other forms of dyspnea: Secondary | ICD-10-CM | POA: Diagnosis not present

## 2023-02-18 DIAGNOSIS — R0789 Other chest pain: Secondary | ICD-10-CM | POA: Insufficient documentation

## 2023-02-18 NOTE — Progress Notes (Signed)
Cardiology Office Note  Date: 02/18/2023   ID: JOBY SEIFFERT, DOB 1961-08-02, MRN [id number]  PCP:  Celene Squibb, MD  Cardiologist:  None Electrophysiologist:  None   Reason for Office Visit: Evaluation of palpitations at the request of Dr. Nevada Crane   History of Present Illness: ELIZ VITATOE is a 62 y.o. female known to have HTN, prediabetes, HLD, acute R MCA stroke in 04/2022, symptomatic R carotid artery stenosis s/p R TCAR in 05/2022 with residual L carotid artery stenosis 40 to 59%, COPD was referred to cardiology clinic for evaluation of palpitations.  Patient started to have exertional jaw and neck pain since CVA in 04/2022 which has been worsening recently. She also has been experiencing substernal chest pressure especially with sitting and laying down position which does not get better with exertion.  She also has palpitations lasting for a few minutes every day and this pushes her into a panic attack and starts to have symptoms of chest pressure, exertional jaw and neck pain.  She thinks this could be psychological and from the panic attack.  Associated with SOB.  Denied denies dizziness, leg swelling. Compliant with medications and no side-effects. No bleeding complications.  Patient quit smoking completely when she had stroke in 04/2022.   Past Medical History:  Diagnosis Date   Anxiety disorder    Atherosclerosis    COPD (chronic obstructive pulmonary disease) (HCC)    Depression    Fibromyalgia    GERD (gastroesophageal reflux disease)    Herpes    Hypercholesterolemia    Hypertension    Neuropathy    Optic atrophy    PONV (postoperative nausea and vomiting)    Prediabetes    Senile purpura ([place])    Stroke [facility])     Past Surgical History:  Procedure Laterality Date   ABDOMINAL HYSTERECTOMY  1994   AORTIC ARCH ANGIOGRAPHY N/A 05/20/2022   Procedure: AORTIC ARCH ANGIOGRAPHY;  Surgeon: Broadus John, MD;  Location: [facility];  Service: Cardiovascular;   Laterality: N/A;   CAROTID ANGIOGRAPHY N/A 05/20/2022   Procedure: CAROTID ANGIOGRAPHY;  Surgeon: Broadus John, MD;  Location: [facility];  Service: Cardiovascular;  Laterality: N/A;   CARPAL TUNNEL RELEASE  2013,2011   right and left   EXCISION NASAL MASS Left 09/25/2013   Procedure: EXCISION NASAL CANCER WITH SPLIT THICKNESS SKIN GRAFT FROM RIGHT THIGH;  Surgeon: Ascencion Dike, MD;  Location: [facility];  Service: ENT;  Laterality: Left;   RIGHT OOPHORECTOMY     THUMB ARTHROSCOPY     right   TONSILLECTOMY     TRANSCAROTID ARTERY REVASCULARIZATION  Right 05/28/2022   Procedure: Transcarotid Artery Revascularization;  Surgeon: Broadus John, MD;  [facility];  Service: Vascular;  Laterality: Right;   TUBAL LIGATION      Current Outpatient Medications  Medication Sig Dispense Refill   ALPRAZolam (XANAX) 0.25 MG tablet Take 1 tablet (0.25 mg total) by mouth at bedtime as needed for anxiety. 15 tablet 0   amLODipine (NORVASC) 5 MG tablet Take 5 mg by mouth daily.     aspirin EC 325 MG tablet Take 325 mg by mouth daily.     clopidogrel (PLAVIX) 75 MG tablet Take 75 mg by mouth daily.     lisinopril (ZESTRIL) 40 MG tablet Take 40 mg by mouth daily.     metFORMIN (GLUCOPHAGE-XR) 500 MG 24 hr tablet Take 1 tablet (500 mg total) by mouth daily  with breakfast. 30 tablet 0   pantoprazole (PROTONIX) 40 MG tablet Take 1 tablet (40 mg total) by mouth daily as needed (reflux). 30 tablet 0   rosuvastatin (CRESTOR) 40 MG tablet Take 1 tablet (40 mg total) by mouth daily. 30 tablet 5   No current facility-administered medications for this visit.   Allergies:  Trazodone and nefazodone   Social History: The patient  reports that she quit smoking about 9 months ago. Her smoking use included cigarettes. She smoked an average of .5 packs per day. She has never used smokeless tobacco. She reports that she does not drink alcohol and does not use drugs.   Family History: The  patient's family history includes Cancer in her sister; Emphysema in her mother; Heart disease in her father; High blood pressure in her father; Obesity in her mother.   ROS:  Please see the history of present illness. Otherwise, complete review of systems is positive for none.  All other systems are reviewed and negative.   Physical Exam: VS:  BP 122/80   Pulse 62   Ht 4\' 10"  (1.473 m)   Wt 161 lb 3.2 oz (73.1 kg)   SpO2 96%   BMI 33.69 kg/m , BMI Body mass index is 33.69 kg/m.  Wt Readings from Last 3 Encounters:  02/18/23 161 lb 3.2 oz (73.1 kg)  02/02/23 157 lb (71.2 kg)  12/23/22 158 lb (71.7 kg)    General: Patient appears comfortable at rest. HEENT: Conjunctiva and lids normal, oropharynx clear with moist mucosa. Neck: Supple, no elevated JVP or carotid bruits, no thyromegaly. Lungs: Clear to auscultation, nonlabored breathing at rest. Cardiac: Regular rate and rhythm, no S3 or significant systolic murmur, no pericardial rub. Abdomen: Soft, nontender, no hepatomegaly, bowel sounds present, no guarding or rebound. Extremities: No pitting edema, distal pulses 2+. Skin: Warm and dry. Musculoskeletal: No kyphosis. Neuropsychiatric: Alert and oriented x3, affect grossly appropriate.  ECG:  An ECG dated 02/18/2023 was personally reviewed today and demonstrated:  Normal sinus rhythm  Recent Labwork: 05/18/2022: TSH 1.362 05/21/2022: Magnesium 2.0 09/23/2022: ALT 23; AST 29; BUN 13; Creatinine, Ser 0.86; Hemoglobin 14.5; Platelets 305; Potassium 3.8; Sodium 137     Component Value Date/Time   CHOL 118 05/29/2022 0059   TRIG 113 05/29/2022 0059   HDL 40 (L) 05/29/2022 0059   CHOLHDL 3.0 05/29/2022 0059   VLDL 23 05/29/2022 0059   LDLCALC 55 05/29/2022 0059    Other Studies Reviewed Today:   Assessment and Plan: Patient is a 62 year old F known to have HTN, prediabetes, HLD, acute R MCA stroke in 04/2022, symptomatic R carotid artery stenosis s/p R TCAR in 05/2022 with  residual L carotid artery stenosis 40 to 59%, COPD was referred to cardiology clinic for evaluation of palpitations.  # Palpitations -Palpitations have been occurring every day, lasting for a few minutes. obtain 1 week event monitor  # Chest pressure -Obtain Lexiscan  # Symptomatic R carotid artery stenosis s/p R TCAR in 05/2022 with residual L carotid artery stenosis 40 to 59% # Acute CVA in 04/2022 -Continue aspirin and Plavix recommendations per vascular surgery -Continue high intensity statin, rosuvastatin 40 mg nightly  # HTN, controlled: Continue amlodipine 5 mg once daily and lisinopril 40 mg once daily.  HTN management per PCP. # HLD: Continue rosuvastatin 40 mg nightly.  Goal LDL less than 70.  H LD management per PCP.   I have spent a total of 45 minutes with patient reviewing chart , EKGs, labs  and examining patient as well as establishing an assessment and plan that was discussed with the patient.  > 50% of time was spent in direct patient care.      Medication Adjustments/Labs and Tests Ordered: Current medicines are reviewed at length with the patient today.  Concerns regarding medicines are outlined above.   Tests Ordered: Orders Placed This Encounter  Procedures   NM Myocar Multi W/Spect W/Wall Motion / EF   EKG 12-Lead     Medication Changes: No orders of the defined types were placed in this encounter.   Disposition:  Follow up  one year  Signed Yer Castello Fidel Levy, MD, 02/18/2023 2:54 PM    [facility] [place], [place], [place] [address]

## 2023-02-18 NOTE — Telephone Encounter (Signed)
Checking percert on the following patient for testing scheduled at [facility].   Lexi dx: DOE    03/01/2023  7 day ZIO XT dx: palpitations

## 2023-02-18 NOTE — Patient Instructions (Signed)
Medication Instructions:  Your physician recommends that you continue on your current medications as directed. Please refer to the Current Medication list given to you today.   Labwork: none  Testing/Procedures: Your physician has requested that you have a lexiscan myoview. For further information please visit [email or web address]. Please follow instruction sheet, as given.   ZIO- Long Term Monitor Instructions   Your physician has requested you wear your ZIO patch monitor 7 days.   This is a single patch monitor.  [employer] supplies one patch monitor per enrollment.  Additional stickers are not available.   Please do not apply patch if you will be having a Nuclear Stress Test, Echocardiogram, Cardiac CT, MRI, or Chest Xray during the time frame you would be wearing the monitor. The patch cannot be worn during these tests.  You cannot remove and re-apply the ZIO XT patch monitor.    While looking in a mirror, press and release button in center of patch.  A small green light will flash 3-4 times .  This will be your only indicator the monitor has been turned on.     Do not shower for the first 24 hours.  You may shower after the first 24 hours.   Press button if you feel a symptom. You will hear a small click.  Record Date, Time and Symptom in the Patient Log Book.   When you are ready to remove patch, follow instructions on last 2 pages of Patient Log Book.  Stick patch monitor onto last page of Patient Log Book.   Place Patient Log Book in [place] box.  Use locking tab on box and tape box closed securely.  The [employer] has [employer] on it.  Please place in mailbox as soon as possible.  Your physician should have your test results approximately 7 days after the monitor has been mailed back to [facility].   Call [facility] at [phone] if you have questions regarding your ZIO XT patch monitor.  Call them immediately if you see an orange light blinking  on your monitor.   If your monitor falls off in less than 4 days contact our Monitor department at [phone].  If your monitor becomes loose or falls off after 4 days call [employer] at [phone] for suggestions on securing your monitor.   Follow-Up:  Your physician recommends that you schedule a follow-up appointment in: 1 year. You will receive a call in about 10 months reminding you to schedule your appointment. If you do not receive this call, please contact our office.   Any Other Special Instructions Will Be Listed Below (If Applicable).  If you need a refill on your cardiac medications before your next appointment, please call your pharmacy.

## 2023-02-19 ENCOUNTER — Encounter: Payer: Self-pay | Admitting: Internal Medicine

## 2023-02-22 ENCOUNTER — Telehealth (INDEPENDENT_AMBULATORY_CARE_PROVIDER_SITE_OTHER): Payer: 59 | Admitting: Internal Medicine

## 2023-02-22 DIAGNOSIS — E669 Obesity, unspecified: Secondary | ICD-10-CM

## 2023-02-22 DIAGNOSIS — R638 Other symptoms and signs concerning food and fluid intake: Secondary | ICD-10-CM | POA: Diagnosis not present

## 2023-02-22 DIAGNOSIS — Z6832 Body mass index (BMI) 32.0-32.9, adult: Secondary | ICD-10-CM | POA: Diagnosis not present

## 2023-02-22 DIAGNOSIS — R7303 Prediabetes: Secondary | ICD-10-CM

## 2023-02-22 MED ORDER — TOPIRAMATE 25 MG PO TABS
25.0000 mg | ORAL_TABLET | Freq: Every day | ORAL | 0 refills | Status: DC
Start: 2023-02-22 — End: 2023-10-14

## 2023-02-22 NOTE — Progress Notes (Addendum)
Office: [phone]  /  Fax: [phone]   TeleHealth Visit:  Due to the COVID-19 pandemic, this visit was completed with telemedicine (audio/video) technology to reduce patient and provider exposure as well as to preserve personal protective equipment.   Diamond Adams has verbally consented to this TeleHealth visit. The patient is located at home, the provider is located at the [facility] office. The participants in this visit include the listed provider and patient. The visit was conducted today via telephone as I could not hear her virtually.  Diamond Adams was unable to use realtime audiovisual technology today and the telehealth visit was conducted via telephone  HPI  Chief Complaint: OBESITY Diamond Adams is here to discuss her progress with her obesity treatment plan along with follow-up of her obesity related diagnoses. Diamond Adams is on the Category 1 Plan and states she is following her eating plan approximately 50% of the time. Diamond Adams states she is walking 10 minutes 7 times per week.  Interval History:  Since last office visit she has remained weight neutral. She reports fair adherence to reduced calorie nutritional plan. She has been working on not skipping meals, increasing protein at every meal, avoiding and or reducing liquid calories, and making healthier choices Reports problems with appetite and hunger signals.  Denies problems with satiety and satiation.  Denies problems with eating patterns and portion control.  Reports abnormal cravings. Denies feeling deprived or restricted.   Barriers identified: cost of healthy foods, having difficulty preparing healthy meals, having difficulty with meal prep and planning, inability to focus on healthy eating, and cost of medication.   Pharmacotherapy for weight loss: She is currently taking Metformin (off label use for incretin effect and / or insulin resistance and / or diabetes prevention) .  But she reports experiencing increased hunger  since starting the medication.   ASSESSMENT AND PLAN  TREATMENT PLAN FOR OBESITY:  Recommended Dietary Goals  Diamond Adams is currently in the action stage of change. As such, her goal is to continue weight management plan. She has agreed to: follow the Category 1 plan  Behavioral Intervention  We discussed the following Behavioral Modification Strategies today: increasing lean protein intake, decreasing simple carbohydrates , increasing vegetables, and increasing lower glycemic fruits.  Additional resources provided today: None  Recommended Physical Activity Goals  Diamond Adams has been advised to work up to 150 minutes of moderate intensity aerobic activity a week and strengthening exercises 2-3 times per week for cardiovascular health, weight loss maintenance and preservation of muscle mass.   She has agreed to :  Increase the intensity, frequency or duration of aerobic exercises    Pharmacotherapy We discussed various medication options to help Zareen with her weight loss efforts and we both agreed to :   Discontinue metformin due to paradoxical effect she has experienced increased hunger. After discussion of benefits, common side effects and shared decision making she is agreeable to starting topiramate 25 mg in the morning.  She has no contraindications to medication.  ASSOCIATED CONDITIONS ADDRESSED TODAY  Class 1 obesity with serious comorbidity and body mass index (BMI) of 32.0 to 32.9 in adult, unspecified obesity type -     Topiramate; Take 1 tablet (25 mg total) by mouth daily.  Dispense: 30 tablet; Refill: 0  Prediabetes Assessment & Plan: Patient did not tolerate metformin medication will therefore be discontinued.  She will continue with nutritional and behavioral strategies to accomplish 7 to 10% body weight loss.  She is also working on  increasing physical activity with a goal of 150 minutes a week.   Abnormal craving Assessment & Plan: Patient has strong hunger signals and  cravings for highly palatable foods.  Her insurance does not cover incretin mimetic's.  She had a paradoxical reaction to metformin.  After discussion of benefits and side effect she will be started on topiramate 25 mg 1 tablet daily.        PHYSICAL EXAM:  There were no vitals taken for this visit. There is no height or weight on file to calculate BMI.  General: She is overweight, cooperative, alert, well developed, and in no acute distress. PSYCH: Has normal mood, affect and thought process.   HEENT: EOMI, sclerae are anicteric. Lungs: Normal breathing effort, no conversational dyspnea. Extremities: No edema.  Neurologic: No gross sensory or motor deficits. No tremors or fasciculations noted.    DIAGNOSTIC DATA REVIEWED:  BMET    Component Value Date/Time   NA 137 09/23/2022 1910   K 3.8 09/23/2022 1910   CL 103 09/23/2022 1910   CO2 24 09/23/2022 1910   GLUCOSE 161 (H) 09/23/2022 1910   BUN 13 09/23/2022 1910   CREATININE 0.86 09/23/2022 1910   CALCIUM 9.4 09/23/2022 1910   GFRNONAA >60 09/23/2022 1910   GFRAA  09/01/2009 1740    >60        The eGFR has been calculated using the MDRD equation. This calculation has not been validated in all clinical situations. eGFR's persistently <60 mL/min signify possible Chronic Kidney Disease.   Lab Results  Component Value Date   HGBA1C 6.1 (H) 12/23/2022   HGBA1C 5.4 05/18/2022   Lab Results  Component Value Date   INSULIN 37.0 (H) 12/23/2022   Lab Results  Component Value Date   TSH 1.362 05/18/2022   CBC    Component Value Date/Time   WBC 11.1 (H) 09/23/2022 1910   RBC 5.22 (H) 09/23/2022 1910   HGB 14.5 09/23/2022 1910   HCT 42.7 09/23/2022 1910   PLT 305 09/23/2022 1910   MCV 81.8 09/23/2022 1910   MCH 27.8 09/23/2022 1910   MCHC 34.0 09/23/2022 1910   RDW 12.9 09/23/2022 1910   Iron Studies No results found for: "IRON", "TIBC", "FERRITIN", "IRONPCTSAT" Lipid Panel     Component Value Date/Time    CHOL 118 05/29/2022 0059   TRIG 113 05/29/2022 0059   HDL 40 (L) 05/29/2022 0059   CHOLHDL 3.0 05/29/2022 0059   VLDL 23 05/29/2022 0059   LDLCALC 55 05/29/2022 0059   Hepatic Function Panel     Component Value Date/Time   PROT 7.3 09/23/2022 1910   ALBUMIN 4.3 09/23/2022 1910   AST 29 09/23/2022 1910   ALT 23 09/23/2022 1910   ALKPHOS 82 09/23/2022 1910   BILITOT 0.5 09/23/2022 1910      Component Value Date/Time   TSH 1.362 05/18/2022 0410   Nutritional Lab Results  Component Value Date   VD25OH 36.76 05/22/2022     Return in about 3 weeks (around 03/15/2023) for For Weight Management with Last Provider - check on response to topiramate.Marland Kitchen She was informed of the importance of frequent follow up visits to maximize her success with intensive lifestyle modifications for her multiple health conditions.   ATTESTASTION STATEMENTS:  Reviewed by clinician on day of visit: allergies, medications, problem list, medical history, surgical history, family history, social history, and previous encounter notes.   I have spent 20 minutes in the care of the patient today including: preparing to see  patient (e.g. review and interpretation of tests, old notes ), obtaining and/or reviewing separately obtained history, counseling and educating the patient, ordering medications, test and procedures, and documenting clinical information in the electronic or other health care record   Worthy Rancher, MD

## 2023-02-22 NOTE — Assessment & Plan Note (Signed)
Patient did not tolerate metformin medication will therefore be discontinued.  She will continue with nutritional and behavioral strategies to accomplish 7 to 10% body weight loss.  She is also working on increasing physical activity with a goal of 150 minutes a week.

## 2023-02-22 NOTE — Assessment & Plan Note (Signed)
Patient has strong hunger signals and cravings for highly palatable foods.  Her insurance does not cover incretin mimetic's.  She had a paradoxical reaction to metformin.  After discussion of benefits and side effect she will be started on topiramate 25 mg 1 tablet daily.

## 2023-03-01 ENCOUNTER — Encounter (HOSPITAL_COMMUNITY): Payer: 59

## 2023-03-03 DIAGNOSIS — R002 Palpitations: Secondary | ICD-10-CM | POA: Diagnosis not present

## 2023-03-15 ENCOUNTER — Emergency Department (HOSPITAL_COMMUNITY)
Admission: EM | Admit: 2023-03-15 | Discharge: 2023-03-16 | Disposition: A | Discharge status: Home / Self Care | Payer: 59 | Attending: Emergency Medicine | Admitting: Emergency Medicine

## 2023-03-15 ENCOUNTER — Encounter (INDEPENDENT_AMBULATORY_CARE_PROVIDER_SITE_OTHER): Payer: Self-pay | Admitting: Internal Medicine

## 2023-03-15 ENCOUNTER — Telehealth: Payer: Self-pay

## 2023-03-15 ENCOUNTER — Other Ambulatory Visit: Payer: Self-pay

## 2023-03-15 ENCOUNTER — Encounter (HOSPITAL_COMMUNITY): Payer: Self-pay

## 2023-03-15 DIAGNOSIS — Z7902 Long term (current) use of antithrombotics/antiplatelets: Secondary | ICD-10-CM | POA: Diagnosis not present

## 2023-03-15 DIAGNOSIS — R531 Weakness: Secondary | ICD-10-CM | POA: Insufficient documentation

## 2023-03-15 DIAGNOSIS — Z79899 Other long term (current) drug therapy: Secondary | ICD-10-CM | POA: Insufficient documentation

## 2023-03-15 DIAGNOSIS — I6523 Occlusion and stenosis of bilateral carotid arteries: Secondary | ICD-10-CM | POA: Diagnosis not present

## 2023-03-15 LAB — BASIC METABOLIC PANEL
Anion gap: 8 (ref 5–15)
BUN: 18 mg/dL (ref 6–20)
CO2: 21 mmol/L — ABNORMAL LOW (ref 22–32)
Calcium: 9 mg/dL (ref 8.9–10.3)
Chloride: 110 mmol/L (ref 98–111)
Creatinine, Ser: 0.89 mg/dL (ref 0.44–1.00)
GFR, Estimated: >60 mL/min (ref >60)
Glucose, Bld: 151 mg/dL — ABNORMAL HIGH (ref 70–99)
Potassium: 3.6 mmol/L (ref 3.5–5.1)
Sodium: 139 mmol/L (ref 135–145)

## 2023-03-15 LAB — CBC
HCT: 41.9 % (ref 36.0–46.0)
Hemoglobin: 13.9 g/dL (ref 12.0–15.0)
MCH: 27.7 pg (ref 26.0–34.0)
MCHC: 33.2 g/dL (ref 30.0–36.0)
MCV: 83.5 fL (ref 80.0–100.0)
Platelets: 288 10*3/uL (ref 150–400)
RBC: 5.02 MIL/uL (ref 3.87–5.11)
RDW: 13.3 % (ref 11.5–15.5)
WBC: 9.1 10*3/uL (ref 4.0–10.5)
nRBC: 0 % (ref 0.0–0.2)

## 2023-03-15 LAB — URINALYSIS, ROUTINE W REFLEX MICROSCOPIC
Bilirubin Urine: NEGATIVE
Glucose, UA: NEGATIVE mg/dL
Hgb urine dipstick: NEGATIVE
Ketones, ur: NEGATIVE mg/dL
Leukocytes,Ua: NEGATIVE
Nitrite: NEGATIVE
Protein, ur: 30 mg/dL — AB
Specific Gravity, Urine: 1.017 (ref 1.005–1.030)
pH: 5 (ref 5.0–8.0)

## 2023-03-15 NOTE — ED Notes (Signed)
Pt noted to be eating snacks and drinking soda while in the waiting room.  

## 2023-03-15 NOTE — Telephone Encounter (Signed)
Spoke to patient regarding symptoms of "Tiredness fatigue slowing of mental and physical activity feel like my body is moving slower than my brain unsteadiness all I feel like I wanna do is sleep when I get up and move around I feel unsteady lightheadedness extremely tired" (per my chart message" and she states that this happened starting new medication topiramax  from Dr. Rikki Spearing. She has sent him a Clinical cytogeneticist message as well. She states that these are also the same symptoms she was having when having her mini strokes, except no vision issues at this time. Advised patient that if she felt similar to previous mini stroke symptoms, she should go to Emergency room for evaluation and in the meantime I would make Megan aware. Patient verbalized understanding of plan.

## 2023-03-15 NOTE — ED Notes (Signed)
ED Provider at bedside. 

## 2023-03-15 NOTE — ED Triage Notes (Signed)
Pt reports wanting to sleep all day for the past 2 days along with muscle cramping.

## 2023-03-16 ENCOUNTER — Telehealth (INDEPENDENT_AMBULATORY_CARE_PROVIDER_SITE_OTHER): Payer: Self-pay

## 2023-03-16 ENCOUNTER — Emergency Department (HOSPITAL_COMMUNITY): Payer: 59

## 2023-03-16 DIAGNOSIS — I6523 Occlusion and stenosis of bilateral carotid arteries: Secondary | ICD-10-CM | POA: Diagnosis not present

## 2023-03-16 MED ORDER — IOHEXOL 350 MG/ML SOLN
75.0000 mL | Freq: Once | INTRAVENOUS | Status: AC | PRN
  Administered 2023-03-16: 75 mL via INTRAVENOUS

## 2023-03-16 MED ORDER — ALPRAZOLAM 0.5 MG PO TABS
0.2500 mg | ORAL_TABLET | Freq: Once | ORAL | Status: AC
  Administered 2023-03-16: 0.25 mg via ORAL
  Filled 2023-03-16: qty 1

## 2023-03-16 NOTE — ED Provider Notes (Signed)
[place] EMERGENCY DEPARTMENT AT [facility]  Provider Note  CSN: [id number] Arrival date & time: 03/15/23 1640  History Chief Complaint  Patient presents with   Fatigue    Diamond Adams is a 62 y.o. 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 She sent a note to her doctor describing the following: "Tiredness fatigue slowing of mental and physical activity feel like my body is moving slower than my brain unsteadiness all I feel like I wanna do is sleep when I get up and move around I feel unsteady lightheadedness extremely tired" She reports these symptoms have been ongoing for 3 days and started at the time time that she started taking topamax as part of a weight loss program. She also reports she has these symptoms several weeks prior to her carotid dissection when she was told she had 'mini-strokes'.    Home Medications Prior to Admission medications   Medication Sig Start Date End Date Taking? Authorizing Provider  ALPRAZolam (XANAX) 0.25 MG tablet Take 1 tablet (0.25 mg total) by mouth at bedtime as needed for anxiety. 05/26/22   Setzer, Lynnell Jude, PA-C  amLODipine (NORVASC) 5 MG tablet Take 5 mg by mouth daily. 07/01/22   [provider]  aspirin EC 325 MG tablet Take 325 mg by mouth daily.    [provider]  clopidogrel (PLAVIX) 75 MG tablet Take 75 mg by mouth daily. 09/01/22   [provider]  lisinopril (ZESTRIL) 40 MG tablet Take 40 mg by mouth daily. 06/22/22   [provider]  pantoprazole (PROTONIX) 40 MG tablet Take 1 tablet (40 mg total) by mouth daily as needed (reflux). 05/26/22   Setzer, Lynnell Jude, PA-C  rosuvastatin (CRESTOR) 40 MG tablet Take 1 tablet (40 mg total) by mouth daily. 11/09/22   Victorino Sparrow, MD  topiramate (TOPAMAX) 25 MG tablet Take 1 tablet (25 mg total) by mouth daily. 02/22/23 03/24/23  Worthy Rancher,  MD     Allergies    Trazodone and nefazodone   Review of Systems   Review of Systems Please see HPI for pertinent positives and negatives  Physical Exam BP (!) 128/59   Pulse 60   Temp 97.8 F (36.6 C) (Oral)   Resp 20   Ht  (1.473 m)   Wt 73.6 kg   SpO2 98%   BMI 33.92 kg/m   Physical Exam Vitals and nursing note reviewed.  Constitutional:      Appearance: Normal appearance.  HENT:     Head: Normocephalic and atraumatic.     Nose: Nose normal.     Mouth/Throat:     Mouth: Mucous membranes are moist.  Eyes:     Extraocular Movements: Extraocular movements intact.     Conjunctiva/sclera: Conjunctivae normal.  Cardiovascular:     Rate and Rhythm: Normal rate.  Pulmonary:     Effort: Pulmonary effort is normal.     Breath sounds: Normal breath sounds.  Abdominal:     General: Abdomen is flat.     Palpations: Abdomen is soft.     Tenderness: There is no abdominal tenderness.  Musculoskeletal:        General: No swelling. Normal range of motion.     Cervical back: Neck supple.  Skin:    General: Skin is warm and dry.  Neurological:     General: No focal deficit present.  Mental Status: She is alert and oriented to person, place, and time.     Cranial Nerves: No cranial nerve deficit.     Sensory: No sensory deficit.     Motor: No weakness.     Coordination: Coordination normal.     Gait: Gait normal.  Psychiatric:        Mood and Affect: Mood normal.     ED Results / Procedures / Treatments   EKG EKG Interpretation  Date/Time:  Tuesday March 16 2023 00:10:19 EDT Ventricular Rate:  68 PR Interval:  191 QRS Duration: 110 QT Interval:  421 QTC Calculation: 438 R Axis:   39 Text Interpretation: Sinus rhythm Nonspecific T abnrm, anterolateral leads Baseline wander in lead(s) V5 No significant change since last tracing Confirmed by Susy Frizzle [phone]) on 03/16/2023 12:42:35 AM  Procedures Procedures  Medications Ordered in the  ED Medications  iohexol (OMNIPAQUE) 350 MG/ML injection 75 mL (75 mLs Intravenous Contrast Given 03/16/23 0037)  ALPRAZolam Prudy Feeler) tablet 0.25 mg (0.25 mg Oral Given 03/16/23 0033)    Initial Impression and Plan  Patient with known prior carotid dissection here with vague weakness with no focal neuro deficits on exam. More likely her symptoms are due to topamax she started a few days ago. Labs done in triage show unremarkable CBC, BMP and UA. Will send for CTA to confirm patent stent.   ED Course   Clinical Course as of 03/16/23 0205  Tue Mar 16, 2023  0204 I personally viewed the images from radiology studies and agree with radiologist interpretation: CTA without acute change from previous. Patient continues to have no focal deficits. Plan discharge home, advised to stop topamax and see if symptoms improved. PCP follow up, RTED for any other concerns.    [CS]    Clinical Course User Index [CS] Pollyann Savoy, MD     MDM Rules/Calculators/A&P Medical Decision Making Problems Addressed: Generalized weakness: acute illness or injury  Amount and/or Complexity of Data Reviewed Labs: ordered. Decision-making details documented in ED Course. Radiology: ordered and independent interpretation performed. Decision-making details documented in ED Course. ECG/medicine tests: ordered and independent interpretation performed. Decision-making details documented in ED Course.  Risk Prescription drug management.     Final Clinical Impression(s) / ED Diagnoses Final diagnoses:  Generalized weakness    Rx / DC Orders ED Discharge Orders     None        Pollyann Savoy, MD 03/16/23 [phone]

## 2023-03-16 NOTE — Telephone Encounter (Signed)
Looks like the patient went to the emergency room yesterday.  Test was relatively unremarkable.  Primary care has DC'd Topamax.  If the patient's symptoms do not improve she is advised to see her PCP or make an appointment with our office

## 2023-03-16 NOTE — ED Provider Notes (Incomplete)
   [place] EMERGENCY DEPARTMENT AT [facility]  Provider Note  CSN: [id number] Arrival date & time: 03/15/23 1640  History Chief Complaint  Patient presents with  . Fatigue    Diamond Adams is a 62 y.o. female    Home Medications Prior to Admission medications   Medication Sig Start Date End Date Taking? Authorizing Provider  ALPRAZolam (XANAX) 0.25 MG tablet Take 1 tablet (0.25 mg total) by mouth at bedtime as needed for anxiety. 05/26/22   Setzer, Lynnell Jude, PA-C  amLODipine (NORVASC) 5 MG tablet Take 5 mg by mouth daily. 07/01/22   [provider]  aspirin EC 325 MG tablet Take 325 mg by mouth daily.    [provider]  clopidogrel (PLAVIX) 75 MG tablet Take 75 mg by mouth daily. 09/01/22   [provider]  lisinopril (ZESTRIL) 40 MG tablet Take 40 mg by mouth daily. 06/22/22   [provider]  pantoprazole (PROTONIX) 40 MG tablet Take 1 tablet (40 mg total) by mouth daily as needed (reflux). 05/26/22   Setzer, Lynnell Jude, PA-C  rosuvastatin (CRESTOR) 40 MG tablet Take 1 tablet (40 mg total) by mouth daily. 11/09/22   Victorino Sparrow, MD  topiramate (TOPAMAX) 25 MG tablet Take 1 tablet (25 mg total) by mouth daily. 02/22/23 03/24/23  Worthy Rancher, MD     Allergies    Trazodone and nefazodone   Review of Systems   Review of Systems Please see HPI for pertinent positives and negatives  Physical Exam BP 132/70   Pulse 74   Temp 98 F (36.7 C)   Resp 16   Ht  (1.473 m)   Wt 73.6 kg   SpO2 98%   BMI 33.92 kg/m   Physical Exam  ED Results / Procedures / Treatments   EKG None  Procedures Procedures  Medications Ordered in the ED Medications - No data to display  Initial Impression and Plan  ***  ED Course       MDM Rules/Calculators/A&P Medical Decision Making Amount and/or Complexity of Data Reviewed Labs: ordered. Radiology: ordered.     Final Clinical Impression(s) / ED Diagnoses Final  diagnoses:  None    Rx / DC Orders ED Discharge Orders     None

## 2023-03-16 NOTE — ED Notes (Signed)
Patient transported to CT 

## 2023-03-16 NOTE — Telephone Encounter (Signed)
Spoke to patient and advised:  Have her stop topiramate as she may be experiencing side effects of medication.  This may also be related to her blood pressure. Ask her to check her blood pressure at home in the morning before taking her meds and when she is feeling lightheaded or dizzy and log.  If symptoms persist after stopping topiramate I recommend that she schedule an appointment with PCP for follow-up.   Pt reports going to the ER last night: EKG, labs, and CT of stent and all ok.  Pt advised to return if symptoms worsen. Pt did d/c Topiramate.

## 2023-03-17 NOTE — Telephone Encounter (Signed)
See phone note

## 2023-03-18 NOTE — Telephone Encounter (Signed)
Can offer an appt with MD.

## 2023-03-22 ENCOUNTER — Telehealth: Payer: Self-pay

## 2023-03-22 DIAGNOSIS — R5383 Other fatigue: Secondary | ICD-10-CM | POA: Diagnosis not present

## 2023-03-22 DIAGNOSIS — J302 Other seasonal allergic rhinitis: Secondary | ICD-10-CM | POA: Diagnosis not present

## 2023-03-22 DIAGNOSIS — Z7182 Exercise counseling: Secondary | ICD-10-CM | POA: Diagnosis not present

## 2023-03-22 DIAGNOSIS — Z87891 Personal history of nicotine dependence: Secondary | ICD-10-CM | POA: Diagnosis not present

## 2023-03-22 DIAGNOSIS — R0600 Dyspnea, unspecified: Secondary | ICD-10-CM | POA: Diagnosis not present

## 2023-03-22 NOTE — Telephone Encounter (Signed)
Transition Care Management Follow-up Telephone Call Date of discharge and from where: 03/16/2023 [facility] How have you been since you were released from the hospital? Patient stated that the dizziness is getting better. Any questions or concerns? No  Items Reviewed: Did the pt receive and understand the discharge instructions provided? Yes  Medications obtained and verified?  There were no medications prescribed. Other?  Patient refused to answer all questions disconnected call. Any new allergies since your discharge? Patient refused to answer all questions disconnected call. Dietary orders reviewed?  Do you have support at home? Patient refused to answer all questions disconnected call.  Home Care and Equipment/Supplies: Were home health services ordered? Patient refused to answer all questions disconnected call. If so, what is the name of the agency? Patient refused to answer all questions disconnected call.  Has the agency set up a time to come to the patient's home? Patient refused to answer all questions disconnected call. Were any new equipment or medical supplies ordered?  What is the name of the medical supply agency? Patient refused to answer all questions disconnected call. Were you able to get the supplies/equipment? Patient refused to answer all questions disconnected call. Do you have any questions related to the use of the equipment or supplies?  Functional Questionnaire: (I = Independent and D = Dependent) ADLs: Patient refused to answer all questions disconnected call.  Bathing/Dressing- Patient refused to answer all questions disconnected call.  Meal Prep- Patient refused to answer all questions disconnected call.  Eating- Patient refused to answer all questions disconnected call.  Maintaining continence- Patient refused to answer all questions disconnected call.  Transferring/Ambulation- Patient refused to answer all questions disconnected  call.  Managing Meds- Patient refused to answer all questions disconnected call.  Follow up appointments reviewed:  PCP Hospital f/u appt confirmed? Patient refused to answer all questions disconnected call. Scheduled to see  on  @ . Specialist Hospital f/u appt confirmed? Patient refused to answer all questions disconnected call. Scheduled to see  on  @ . Are transportation arrangements needed? Patient refused to answer all questions disconnected call. If their condition worsens, is the pt aware to call PCP or go to the Emergency Dept.?   Was the patient provided with contact information for the PCP's office or ED?   Was to pt encouraged to call back with questions or concerns?    Ayani Ospina [facility]  [facility] Population Health Community Resource Care Guide   [email or web address]  ?? [phone]   Website: [email or web address]  [email or web address]

## 2023-03-22 NOTE — Telephone Encounter (Signed)
I called Diamond Adams.  She went to ED and work up ok, saw her pcp today. I offered next available with Dr. Pearlean Brownie and appt 03-25-2023 at 1500.  She was appreciative.

## 2023-03-25 ENCOUNTER — Ambulatory Visit: Payer: 59 | Admitting: Neurology

## 2023-03-26 ENCOUNTER — Ambulatory Visit (INDEPENDENT_AMBULATORY_CARE_PROVIDER_SITE_OTHER): Payer: 59 | Admitting: Physician Assistant

## 2023-03-26 ENCOUNTER — Ambulatory Visit (HOSPITAL_COMMUNITY)
Admission: RE | Admit: 2023-03-26 | Discharge: 2023-03-26 | Disposition: A | Discharge status: Home / Self Care | Payer: 59 | Source: Ambulatory Visit | Attending: Vascular Surgery | Admitting: Vascular Surgery

## 2023-03-26 VITALS — BP 124/66 | HR 55 | Temp 97.7°F | Resp 20 | Ht <= 58 in | Wt 162.2 lb

## 2023-03-26 DIAGNOSIS — Z95828 Presence of other vascular implants and grafts: Secondary | ICD-10-CM | POA: Diagnosis not present

## 2023-03-26 DIAGNOSIS — I6523 Occlusion and stenosis of bilateral carotid arteries: Secondary | ICD-10-CM | POA: Diagnosis not present

## 2023-03-26 NOTE — Progress Notes (Signed)
Office Note     CC:  follow up Requesting Provider:  Benita Stabile, MD  HPI: Diamond Adams is a 62 y.o. (1961-06-02) female who presents for routine follow up of carotid artery stenosis. She is s/p right TCAR on 05/28/22 by Dr. Karin Lieu for symptomatic high grade stenosis. She has done well post operatively. At her last follow up in November she did have some recurrent stenosis seen on CTA. She was asymptomatic. Dr. Karin Lieu increased her statin and she has been maintained on Plavix and Aspirin as well.   She returns today for close interval follow up. She says overall she has not been feeling very well. Has generalized fatigue, has been having some presyncopal episodes, palpitations, all over cramping,  lightheadedness. She had contacted her Neurologist and they recommended she got to ER. So she went to ER on 4/23. Her workup was unremarkable including CTA  head and neck. She is scheduled to follow up with her cardiologist for a stress test. She said she missed her original appointment due to feeling sick. She does report that she had a Holter monitor for 7 days and it did not show any irregularities. She otherwise denies any visual changes, slurred speech, facial drooping, unilateral upper or lower extremity weakness or numbness. She is going to the weight management clinic and there was some concerns that her medication (Topamax) from there was causing her symptoms so she has now been off of that medication for 1 week. She has not noticed any real improvement. She says she was very concerned because these symptoms she has been having are very similar to what she had prior to her stoke. She otherwise has continued with her smoking cessation. She is compliant with Aspirin, Plavix and Statin.   Past Medical History:  Diagnosis Date   Anxiety disorder    Atherosclerosis    COPD (chronic obstructive pulmonary disease) (HCC)    Depression    Fibromyalgia    GERD (gastroesophageal reflux disease)    Herpes     Hypercholesterolemia    Hypertension    Neuropathy    Optic atrophy    PONV (postoperative nausea and vomiting)    Prediabetes    Senile purpura (HCC)    Stroke [facility])     Past Surgical History:  Procedure Laterality Date   ABDOMINAL HYSTERECTOMY  1994   AORTIC ARCH ANGIOGRAPHY N/A 05/20/2022   Procedure: AORTIC ARCH ANGIOGRAPHY;  Surgeon: Victorino Sparrow, MD;  Location: [facility] INVASIVE CV LAB;  Service: Cardiovascular;  Laterality: N/A;   CAROTID ANGIOGRAPHY N/A 05/20/2022   Procedure: CAROTID ANGIOGRAPHY;  Surgeon: Victorino Sparrow, MD;  Location: [facility] INVASIVE CV LAB;  Service: Cardiovascular;  Laterality: N/A;   CARPAL TUNNEL RELEASE  2013,2011   right and left   EXCISION NASAL MASS Left 09/25/2013   Procedure: EXCISION NASAL CANCER WITH SPLIT THICKNESS SKIN GRAFT FROM RIGHT THIGH;  Surgeon: Darletta Moll, MD;  Location: [facility];  Service: ENT;  Laterality: Left;   RIGHT OOPHORECTOMY     THUMB ARTHROSCOPY     right   TONSILLECTOMY     TRANSCAROTID ARTERY REVASCULARIZATION  Right 05/28/2022   Procedure: Transcarotid Artery Revascularization;  Surgeon: Victorino Sparrow, MD;  Location: [facility] OR;  Service: Vascular;  Laterality: Right;   TUBAL LIGATION      Social History   Socioeconomic History   Marital status: Divorced    Spouse name: Not on file   Number of children: Not on file  Years of education: Not on file   Highest education level: Not on file  Occupational History   Occupation: Disabled  Tobacco Use   Smoking status: Former    Packs/day: .5    Types: Cigarettes    Quit date: 05/17/2022    Years since quitting: 0.8    Passive exposure: Never   Smokeless tobacco: Never   Tobacco comments:    Last time smoking was 05/20/22.  Vaping Use   Vaping Use: Never used  Substance and Sexual Activity   Alcohol use: No   Drug use: No   Sexual activity: Yes    Birth control/protection: Surgical  Other Topics Concern   Not on file  Social History Narrative    Not on file   Social Determinants of Health   Financial Resource Strain: Not on file  Food Insecurity: Not on file  Transportation Needs: Not on file  Physical Activity: Not on file  Stress: Not on file  Social Connections: Not on file  Intimate Partner Violence: Not on file    Family History  Problem Relation Age of Onset   Emphysema Mother    Obesity Mother    Heart disease Father    High blood pressure Father    Cancer Sister        cervical   Stroke Neg Hx     Current Outpatient Medications  Medication Sig Dispense Refill   albuterol (VENTOLIN HFA) 108 (90 Base) MCG/ACT inhaler Inhale into the lungs.     ALPRAZolam (XANAX) 0.25 MG tablet Take 1 tablet (0.25 mg total) by mouth at bedtime as needed for anxiety. 15 tablet 0   amLODipine (NORVASC) 5 MG tablet Take 5 mg by mouth daily.     aspirin EC 325 MG tablet Take 325 mg by mouth daily.     clopidogrel (PLAVIX) 75 MG tablet Take 75 mg by mouth daily.     lisinopril (ZESTRIL) 40 MG tablet Take 40 mg by mouth daily.     pantoprazole (PROTONIX) 40 MG tablet Take 1 tablet (40 mg total) by mouth daily as needed (reflux). 30 tablet 0   rosuvastatin (CRESTOR) 40 MG tablet Take 1 tablet (40 mg total) by mouth daily. 30 tablet 5   topiramate (TOPAMAX) 25 MG tablet Take 1 tablet (25 mg total) by mouth daily. 30 tablet 0   No current facility-administered medications for this visit.    Allergies  Allergen Reactions   Trazodone And Nefazodone Hives     REVIEW OF SYSTEMS:   [X]  denotes positive finding, [ ]  denotes negative finding Cardiac  Comments:  Chest pain or chest pressure:    Shortness of breath upon exertion:    Short of breath when lying flat:    Irregular heart rhythm:        Vascular    Pain in calf, thigh, or hip brought on by ambulation:    Pain in feet at night that wakes you up from your sleep:     Blood clot in your veins:    Leg swelling:         Pulmonary    Oxygen at home:    Productive cough:      Wheezing:         Neurologic    Sudden weakness in arms or legs:     Sudden numbness in arms or legs:     Sudden onset of difficulty speaking or slurred speech:    Temporary loss of vision in one eye:  Problems with dizziness:         Gastrointestinal    Blood in stool:     Vomited blood:         Genitourinary    Burning when urinating:     Blood in urine:        Psychiatric    Major depression:         Hematologic    Bleeding problems:    Problems with blood clotting too easily:        Skin    Rashes or ulcers:        Constitutional    Fever or chills:      PHYSICAL EXAMINATION:  Vitals:   03/26/23 0828 03/26/23 0833  BP: 107/66 124/66  Pulse: (!) 55   Resp: 20   Temp: 97.7 F (36.5 C)   TempSrc: Temporal   SpO2: 100%   Weight: 162 lb 3.2 oz (73.6 kg)   Height: 4\' 10"  (1.473 m)     General:  WDWN in NAD; vital signs documented above Gait: Normal HENT: WNL, normocephalic Pulmonary: normal non-labored breathing , without wheezing Cardiac: regular HR, no carotid bruits Abdomen: soft, NT, no masses Vascular Exam/Pulses: palpable distal pulses bilaterally, feet warm and well perfused Extremities: without ischemic changes, without Gangrene , without cellulitis; without open wounds;  Musculoskeletal: no muscle wasting or atrophy  Neurologic: A&O X 3 Psychiatric:  The pt has Normal affect.   Non-Invasive Vascular Imaging:   VAS US Carotid Duplex: Summary:  Right Carotid: Patent stent with no evidence for restenosis.   Left Carotid: Velocities in the left ICA are consistent with a 40-59% stenosis.   Vertebrals: Bilateral vertebral arteries demonstrate antegrade flow.  Subclavians: Normal flow hemodynamics were seen in bilateral subclavian arteries.    CT Angio Head neck: 03/16/23 IMPRESSION: CT HEAD IMPRESSION:   1. No acute intracranial abnormality. 2. Chronic posterior right MCA distribution infarct.   CTA HEAD AND NECK IMPRESSION:   1.  Negative CTA for large vessel occlusion or other emergent finding. 2. Vascular stent traversing the right carotid bulb. Internal thrombus/plaque within the stent itself with associated stenosis of up to 50% by NASCET criteria, relatively stable from prior. 3. Atheromatous change about the left carotid bulb/proximal left ICA with associated stenosis of up to 30% by NASCET criteria, relatively stable. 4. Mild atheromatous change about the carotid siphons without hemodynamically significant stenosis.   Aortic Atherosclerosis (ICD10-I70.0).  ASSESSMENT/PLAN:: 62 y.o. female here for follow up for carotid artery stenosis. She is s/p right TCAR on 05/28/22 by Dr. Karin Lieu for symptomatic high grade stenosis. I do not feel that her current symptoms are associated with her carotid disease. She is not having any neurological deficits. She is having some very vague symptoms which may be more cardiac or medication related. I have recommended that she check her blood pressure and record results on daily bases especially during her episodes of not feeling well. Discussed with her also my concern that she has intolerance to her statin.  - Duplex today shows no significant in stent restenosis on right ICA, left ICA with 40-59% stenosis. Normal subclavian and vertebral artery flow - Her recent CTA is relatively consistent with the duplex findings of  < 50% in stent right ICA stenosis and < 3% left ICA stenosis - Continue Aspirin, Statin, and Plavix - Congratulated her on her smoking cessation and encouraged her to continue to not smoke - Recommend referral to lipid clinic for evaluation of other alternatives options  to statin. I have concerns she is having myalgias from the her Crestor - Recommended she continue follow up with Cardiologist regarding her other symptoms of dizziness, weakness and palpitations - She is aware of the signs and symptoms of TIA/ Stroke and she knows to go to ER or Call 911 if these were to  occur - She will follow up with [place] again in 6 months with repeat carotid duplex for closer interval follow up of InStent restenosis  Graceann Congress, PA-C [facility] [phone]  Clinic MD:   Karin Lieu

## 2023-03-29 DIAGNOSIS — K5900 Constipation, unspecified: Secondary | ICD-10-CM | POA: Diagnosis not present

## 2023-03-29 DIAGNOSIS — R350 Frequency of micturition: Secondary | ICD-10-CM | POA: Diagnosis not present

## 2023-04-05 ENCOUNTER — Other Ambulatory Visit: Payer: Self-pay

## 2023-04-05 DIAGNOSIS — I6523 Occlusion and stenosis of bilateral carotid arteries: Secondary | ICD-10-CM

## 2023-04-07 ENCOUNTER — Ambulatory Visit (HOSPITAL_COMMUNITY)
Admission: RE | Admit: 2023-04-07 | Discharge: 2023-04-07 | Disposition: A | Discharge status: Home / Self Care | Payer: 59 | Source: Ambulatory Visit | Attending: Internal Medicine | Admitting: Internal Medicine

## 2023-04-07 ENCOUNTER — Encounter (HOSPITAL_COMMUNITY)
Admission: RE | Admit: 2023-04-07 | Discharge: 2023-04-07 | Disposition: A | Discharge status: Home / Self Care | Payer: 59 | Source: Ambulatory Visit | Attending: Internal Medicine | Admitting: Internal Medicine

## 2023-04-07 DIAGNOSIS — R0609 Other forms of dyspnea: Secondary | ICD-10-CM | POA: Insufficient documentation

## 2023-04-07 LAB — NM MYOCAR MULTI W/SPECT W/WALL MOTION / EF
Base ST Depression (mm): 0 mm
LV dias vol: 51 mL (ref 46–106)
LV sys vol: 10 mL
Nuc Stress EF: 81 %
Peak HR: 129 {beats}/min
RATE: 0.4
Rest HR: 71 {beats}/min
Rest Nuclear Isotope Dose: 10.7 mCi
SDS: 1
SRS: 0
SSS: 1
ST Depression (mm): 0 mm
Stress Nuclear Isotope Dose: 30 mCi
TID: 1.01

## 2023-04-07 MED ORDER — TECHNETIUM TC 99M TETROFOSMIN IV KIT
30.0000 | PACK | Freq: Once | INTRAVENOUS | Status: AC | PRN
  Administered 2023-04-07: 30 via INTRAVENOUS

## 2023-04-07 MED ORDER — TECHNETIUM TC 99M TETROFOSMIN IV KIT
10.0000 | PACK | Freq: Once | INTRAVENOUS | Status: AC | PRN
  Administered 2023-04-07: 10.7 via INTRAVENOUS

## 2023-04-07 MED ORDER — REGADENOSON 0.4 MG/5ML IV SOLN
INTRAVENOUS | Status: AC
  Administered 2023-04-07: 0.4 mg via INTRAVENOUS
  Filled 2023-04-07: qty 5

## 2023-04-07 MED ORDER — SODIUM CHLORIDE FLUSH 0.9 % IV SOLN
INTRAVENOUS | Status: AC
  Administered 2023-04-07: 10 mL via INTRAVENOUS
  Filled 2023-04-07: qty 10

## 2023-04-09 ENCOUNTER — Encounter (HOSPITAL_COMMUNITY): Payer: 59

## 2023-04-09 ENCOUNTER — Ambulatory Visit: Payer: 59

## 2023-04-15 DIAGNOSIS — E785 Hyperlipidemia, unspecified: Secondary | ICD-10-CM | POA: Diagnosis not present

## 2023-04-15 DIAGNOSIS — I10 Essential (primary) hypertension: Secondary | ICD-10-CM | POA: Diagnosis not present

## 2023-04-20 ENCOUNTER — Other Ambulatory Visit (HOSPITAL_COMMUNITY): Payer: Self-pay | Admitting: Radiology

## 2023-04-20 ENCOUNTER — Other Ambulatory Visit (HOSPITAL_COMMUNITY): Payer: Self-pay | Admitting: Family Medicine

## 2023-04-20 DIAGNOSIS — J302 Other seasonal allergic rhinitis: Secondary | ICD-10-CM | POA: Diagnosis not present

## 2023-04-20 DIAGNOSIS — R0600 Dyspnea, unspecified: Secondary | ICD-10-CM | POA: Diagnosis not present

## 2023-04-20 DIAGNOSIS — I6529 Occlusion and stenosis of unspecified carotid artery: Secondary | ICD-10-CM | POA: Diagnosis not present

## 2023-04-20 DIAGNOSIS — G4700 Insomnia, unspecified: Secondary | ICD-10-CM | POA: Diagnosis not present

## 2023-04-20 DIAGNOSIS — K219 Gastro-esophageal reflux disease without esophagitis: Secondary | ICD-10-CM | POA: Diagnosis not present

## 2023-04-20 DIAGNOSIS — Z1231 Encounter for screening mammogram for malignant neoplasm of breast: Secondary | ICD-10-CM

## 2023-04-20 DIAGNOSIS — K5900 Constipation, unspecified: Secondary | ICD-10-CM | POA: Diagnosis not present

## 2023-04-20 DIAGNOSIS — R5383 Other fatigue: Secondary | ICD-10-CM | POA: Diagnosis not present

## 2023-04-20 DIAGNOSIS — I739 Peripheral vascular disease, unspecified: Secondary | ICD-10-CM | POA: Diagnosis not present

## 2023-04-20 DIAGNOSIS — E785 Hyperlipidemia, unspecified: Secondary | ICD-10-CM | POA: Diagnosis not present

## 2023-04-20 DIAGNOSIS — I10 Essential (primary) hypertension: Secondary | ICD-10-CM | POA: Diagnosis not present

## 2023-04-28 ENCOUNTER — Encounter (HOSPITAL_COMMUNITY): Payer: Self-pay

## 2023-04-28 ENCOUNTER — Ambulatory Visit (HOSPITAL_COMMUNITY)
Admission: RE | Admit: 2023-04-28 | Discharge: 2023-04-28 | Disposition: A | Discharge status: Home / Self Care | Payer: 59 | Source: Ambulatory Visit | Attending: Family Medicine | Admitting: Family Medicine

## 2023-04-28 DIAGNOSIS — Z1231 Encounter for screening mammogram for malignant neoplasm of breast: Secondary | ICD-10-CM | POA: Diagnosis not present

## 2023-06-22 ENCOUNTER — Ambulatory Visit (HOSPITAL_COMMUNITY)
Admission: RE | Admit: 2023-06-22 | Discharge: 2023-06-22 | Disposition: A | Discharge status: Home / Self Care | Payer: 59 | Source: Ambulatory Visit | Attending: Internal Medicine | Admitting: Internal Medicine

## 2023-06-22 DIAGNOSIS — R0600 Dyspnea, unspecified: Secondary | ICD-10-CM | POA: Insufficient documentation

## 2023-06-22 MED ORDER — ALBUTEROL SULFATE (2.5 MG/3ML) 0.083% IN NEBU
2.5000 mg | INHALATION_SOLUTION | Freq: Once | RESPIRATORY_TRACT | Status: AC
  Administered 2023-06-22: 2.5 mg via RESPIRATORY_TRACT

## 2023-07-01 DIAGNOSIS — R0600 Dyspnea, unspecified: Secondary | ICD-10-CM | POA: Diagnosis not present

## 2023-08-17 DIAGNOSIS — R300 Dysuria: Secondary | ICD-10-CM | POA: Diagnosis not present

## 2023-08-18 DIAGNOSIS — R300 Dysuria: Secondary | ICD-10-CM | POA: Diagnosis not present

## 2023-08-31 DIAGNOSIS — K5904 Chronic idiopathic constipation: Secondary | ICD-10-CM | POA: Diagnosis not present

## 2023-09-28 ENCOUNTER — Other Ambulatory Visit (HOSPITAL_COMMUNITY): Payer: Self-pay | Admitting: Family Medicine

## 2023-09-28 DIAGNOSIS — K5904 Chronic idiopathic constipation: Secondary | ICD-10-CM | POA: Diagnosis not present

## 2023-09-29 ENCOUNTER — Ambulatory Visit (HOSPITAL_COMMUNITY)
Admission: RE | Admit: 2023-09-29 | Discharge: 2023-09-29 | Disposition: A | Discharge status: Home / Self Care | Payer: 59 | Source: Ambulatory Visit | Attending: Family Medicine | Admitting: Family Medicine

## 2023-09-29 DIAGNOSIS — K5909 Other constipation: Secondary | ICD-10-CM | POA: Diagnosis not present

## 2023-09-29 DIAGNOSIS — K5904 Chronic idiopathic constipation: Secondary | ICD-10-CM | POA: Diagnosis not present

## 2023-10-01 ENCOUNTER — Encounter (HOSPITAL_COMMUNITY): Payer: 59

## 2023-10-01 ENCOUNTER — Ambulatory Visit: Payer: 59

## 2023-10-07 ENCOUNTER — Encounter: Payer: Self-pay | Admitting: Physician Assistant

## 2023-10-07 ENCOUNTER — Ambulatory Visit (HOSPITAL_COMMUNITY)
Admission: RE | Admit: 2023-10-07 | Discharge: 2023-10-07 | Disposition: A | Discharge status: Home / Self Care | Payer: 59 | Source: Ambulatory Visit | Attending: Vascular Surgery | Admitting: Vascular Surgery

## 2023-10-07 ENCOUNTER — Ambulatory Visit: Payer: 59 | Admitting: Physician Assistant

## 2023-10-07 VITALS — Temp 98.1°F | Ht <= 58 in | Wt 158.5 lb

## 2023-10-07 DIAGNOSIS — I6523 Occlusion and stenosis of bilateral carotid arteries: Secondary | ICD-10-CM | POA: Diagnosis not present

## 2023-10-07 NOTE — Progress Notes (Signed)
Office Note     CC:  follow up Requesting Provider:  Benita Stabile, MD  HPI: Diamond Adams is a 62 y.o. (1961/08/17) female who presents for routine follow up of carotid artery stenosis. She is s/p right TCAR on 05/28/22 by Dr. Karin Lieu for symptomatic high grade stenosis. At time of her last visit in May she was overall not feeling very well. She was not having any new neurological symptoms but a lot of other vague symptoms. She had been evaluated in ER and was undergoing further workup by Cardiology and her PCP. Her duplex showed stable ICA stenosis.  Today she reports over past several weeks she has been having dizziness, constipation and early satiety, cramping all over, and generalized fatigue. This was happening previously around the time she had her last visit. She had full workup and nothing was found. This has all increased her anxiety acutely. She says she has had a lot of trouble sleeping as a result. She is scheduled to see her PCP next week and also GI for evaluation. She otherwise denies any amaurosis, facial drooping, slurred speech, unilateral upper or lower extremity weakness or numbness. She denies any pain on ambulation or rest. No tissue loss. She is medically managed on Aspirin, Statin and Plavix.   Past Medical History:  Diagnosis Date   Anxiety disorder    Atherosclerosis    COPD (chronic obstructive pulmonary disease) (HCC)    Depression    Fibromyalgia    GERD (gastroesophageal reflux disease)    Herpes    Hypercholesterolemia    Hypertension    Neuropathy    Optic atrophy    PONV (postoperative nausea and vomiting)    Prediabetes    Senile purpura (HCC)    Stroke [facility])     Past Surgical History:  Procedure Laterality Date   ABDOMINAL HYSTERECTOMY  1994   AORTIC ARCH ANGIOGRAPHY N/A 05/20/2022   Procedure: AORTIC ARCH ANGIOGRAPHY;  Surgeon: Victorino Sparrow, MD;  Location: [facility] INVASIVE CV LAB;  Service: Cardiovascular;  Laterality: N/A;   CAROTID ANGIOGRAPHY N/A  05/20/2022   Procedure: CAROTID ANGIOGRAPHY;  Surgeon: Victorino Sparrow, MD;  Location: [facility] INVASIVE CV LAB;  Service: Cardiovascular;  Laterality: N/A;   CARPAL TUNNEL RELEASE  2013,2011   right and left   EXCISION NASAL MASS Left 09/25/2013   Procedure: EXCISION NASAL CANCER WITH SPLIT THICKNESS SKIN GRAFT FROM RIGHT THIGH;  Surgeon: Darletta Moll, MD;  Location: [facility];  Service: ENT;  Laterality: Left;   RIGHT OOPHORECTOMY     THUMB ARTHROSCOPY     right   TONSILLECTOMY     TRANSCAROTID ARTERY REVASCULARIZATION  Right 05/28/2022   Procedure: Transcarotid Artery Revascularization;  Surgeon: Victorino Sparrow, MD;  Location: [facility] OR;  Service: Vascular;  Laterality: Right;   TUBAL LIGATION      Social History   Socioeconomic History   Marital status: Divorced    Spouse name: Not on file   Number of children: Not on file   Years of education: Not on file   Highest education level: Not on file  Occupational History   Occupation: Disabled  Tobacco Use   Smoking status: Former    Current packs/day: 0.00    Types: Cigarettes    Quit date: 05/17/2022    Years since quitting: 1.3    Passive exposure: Never   Smokeless tobacco: Never   Tobacco comments:    Last time smoking was 05/20/22.  Vaping Use  Vaping status: Never Used  Substance and Sexual Activity   Alcohol use: No   Drug use: No   Sexual activity: Yes    Birth control/protection: Surgical  Other Topics Concern   Not on file  Social History Narrative   Not on file   Social Determinants of Health   Financial Resource Strain: Not on file  Food Insecurity: Not on file  Transportation Needs: Not on file  Physical Activity: Not on file  Stress: Not on file  Social Connections: Not on file  Intimate Partner Violence: Not on file    Family History  Problem Relation Age of Onset   Emphysema Mother    Obesity Mother    Heart disease Father    High blood pressure Father    Cancer Sister         cervical   Stroke Neg Hx     Current Outpatient Medications  Medication Sig Dispense Refill   albuterol (VENTOLIN HFA) 108 (90 Base) MCG/ACT inhaler Inhale into the lungs.     ALPRAZolam (XANAX) 0.25 MG tablet Take 1 tablet (0.25 mg total) by mouth at bedtime as needed for anxiety. 15 tablet 0   amLODipine (NORVASC) 5 MG tablet Take 5 mg by mouth daily.     aspirin EC 325 MG tablet Take 325 mg by mouth daily.     clopidogrel (PLAVIX) 75 MG tablet Take 75 mg by mouth daily.     lisinopril (ZESTRIL) 40 MG tablet Take 40 mg by mouth daily.     pantoprazole (PROTONIX) 40 MG tablet Take 1 tablet (40 mg total) by mouth daily as needed (reflux). 30 tablet 0   rosuvastatin (CRESTOR) 40 MG tablet Take 1 tablet (40 mg total) by mouth daily. 30 tablet 5   TRULANCE 3 MG TABS Take 1 tablet by mouth daily.     topiramate (TOPAMAX) 25 MG tablet Take 1 tablet (25 mg total) by mouth daily. 30 tablet 0   No current facility-administered medications for this visit.    Allergies  Allergen Reactions   Trazodone And Nefazodone Hives     REVIEW OF SYSTEMS:  [X]  denotes positive finding, [ ]  denotes negative finding Cardiac  Comments:  Chest pain or chest pressure:    Shortness of breath upon exertion:    Short of breath when lying flat:    Irregular heart rhythm:        Vascular    Pain in calf, thigh, or hip brought on by ambulation:    Pain in feet at night that wakes you up from your sleep:     Blood clot in your veins:    Leg swelling:         Pulmonary    Oxygen at home:    Productive cough:     Wheezing:         Neurologic    Sudden weakness in arms or legs:     Sudden numbness in arms or legs:     Sudden onset of difficulty speaking or slurred speech:    Temporary loss of vision in one eye:     Problems with dizziness:         Gastrointestinal    Blood in stool:     Vomited blood:         Genitourinary    Burning when urinating:     Blood in urine:        Psychiatric     Major depression:  Hematologic    Bleeding problems:    Problems with blood clotting too easily:        Skin    Rashes or ulcers:        Constitutional    Fever or chills:      PHYSICAL EXAMINATION:  Vitals:   10/07/23 1302  Temp: 98.1 F (36.7 C)  TempSrc: Temporal  SpO2: 97%  Weight: 158 lb 8 oz (71.9 kg)  Height: 4\' 10"  (1.473 m)    General:  WDWN in NAD; vital signs documented above Gait: Normal HENT: WNL, normocephalic Pulmonary: normal non-labored breathing , without wheezing Cardiac: regular HR Abdomen: soft Vascular Exam/Pulses: 2+ femoral, 2+ Dp and 2+ radial bilaterally Extremities: without ischemic changes, without Gangrene , without cellulitis; without open wounds;  Musculoskeletal: no muscle wasting or atrophy  Neurologic: A&O X 3 Psychiatric:  The pt has Normal affect.   Non-Invasive Vascular Imaging:   Carotid duplex: Summary:  Right Carotid: The ECA appears >50% stenosed. Patent stent without evidence of stenosis.   Left Carotid: Velocities in the left ICA are consistent with a 40-59% stenosis.   Vertebrals: Bilateral vertebral arteries demonstrate antegrade flow.  Subclavians: Normal flow hemodynamics were seen in bilateral subclavian  arteries.   ASSESSMENT/PLAN:: 62 y.o. female here for follow up for carotid artery stenosis. She is s/p right TCAR on 05/28/22 by Dr. Karin Lieu for symptomatic high grade stenosis. She is currently without any associated neurological symptoms. I think her current vague symptoms are unrelated. She has follow up with PCP next week. Her duplex today shows stable ultrasound with no recurrent right ICA stenosis, 40-59% left ICA stenosis and normal flow in the vertebral and subclavian arteries bilaterally. Provided reassurance to her that her study overall looks good, essentially unchanged from 6 months ago. This provided her some relief.  - Continue Aspirin, statin, Plavix - She is aware of the signs and symptoms of  TIA/ Stroke and she knows to go to ER or Call 911 if these were to occur - She will follow up with [place] again in 6 months with repeat carotid duplex    Graceann Congress, PA-C [facility] [phone]  Clinic MD:   Karin Lieu

## 2023-10-14 ENCOUNTER — Telehealth: Payer: Self-pay | Admitting: *Deleted

## 2023-10-14 ENCOUNTER — Ambulatory Visit: Payer: 59 | Admitting: Internal Medicine

## 2023-10-14 ENCOUNTER — Encounter: Payer: Self-pay | Admitting: Internal Medicine

## 2023-10-14 VITALS — BP 112/58 | HR 57 | Temp 98.6°F | Ht <= 58 in | Wt 163.4 lb

## 2023-10-14 DIAGNOSIS — I10 Essential (primary) hypertension: Secondary | ICD-10-CM | POA: Diagnosis not present

## 2023-10-14 DIAGNOSIS — R7301 Impaired fasting glucose: Secondary | ICD-10-CM | POA: Diagnosis not present

## 2023-10-14 DIAGNOSIS — K5904 Chronic idiopathic constipation: Secondary | ICD-10-CM | POA: Diagnosis not present

## 2023-10-14 DIAGNOSIS — E785 Hyperlipidemia, unspecified: Secondary | ICD-10-CM | POA: Diagnosis not present

## 2023-10-14 DIAGNOSIS — Z79899 Other long term (current) drug therapy: Secondary | ICD-10-CM | POA: Diagnosis not present

## 2023-10-14 DIAGNOSIS — K219 Gastro-esophageal reflux disease without esophagitis: Secondary | ICD-10-CM

## 2023-10-14 DIAGNOSIS — Z1211 Encounter for screening for malignant neoplasm of colon: Secondary | ICD-10-CM

## 2023-10-14 NOTE — Patient Instructions (Signed)
For your constipation, recommend enema at home.  I will give you samples of Linzess 290 mcg daily.  Let me know how this does for you and I can send in formal prescription if improved.  Linzess works best when taken once a day every day, on an empty stomach, at least 30 minutes before your first meal of the day.  When Linzess is taken daily as directed:  *Constipation relief is typically felt in about a week *IBS-C patients may begin to experience relief from belly pain and overall abdominal symptoms (pain, discomfort, and bloating) in about 1 week,   with symptoms typically improving over 12 weeks.  Diarrhea may occur in the first 2 weeks -keep taking it.  The diarrhea should go away and you should start having normal, complete, full bowel movements. It may be helpful to start treatment when you can be near the comfort of your own bathroom, such as a weekend.    We will schedule you for colonoscopy to further evaluate your symptoms as well as for colon cancer screening purposes.  Continue pantoprazole for your chronic reflux.  It was very nice meeting both you today.  Dr. Marletta Lor

## 2023-10-14 NOTE — Telephone Encounter (Signed)
  Request for patient to stop medication prior to procedure or is needing cleareance  10/14/23  Diamond Adams 07-19-1961  What type of surgery is being performed? colonoscopy  When is surgery scheduled? TBD  What type of clearance is required (medical or pharmacy to hold medication or both? medication  Are there any medications that need to be held prior to surgery and how long?  Plavix x 5 days  Name of physician performing surgery?  Dr.Carver [facility] at [employer]: [phone] Fax: [phone]  Anethesia type (none, local, MAC, general)? MAC

## 2023-10-14 NOTE — Progress Notes (Signed)
Primary Care Physician:  Lupita Raider, NP Primary Gastroenterologist:  Dr. Marletta Lor  Chief Complaint  Patient presents with   New Patient (Initial Visit)    Pt referred for constipation    HPI:   Diamond Adams is a 62 y.o. female who presents to the clinic today by referral from her PCP Lupita Raider for evaluation for constipation.  Chronic GERD: Well-controlled on pantoprazole 40 mg daily.  Notes associated bloating in this regard though stopped drinking sodas and her bloating has improved drastically.  Denies any dysphagia odynophagia.  No epigastric or chest pain.  Chronic constipation: Progressively worsening.  Notes bowel movements every 2 to 3 days.  Incomplete evacuation.  Denies any hard stools.  Does note occasional straining.  Also notes increased pressure in her rectum prior to bowel movements.  Trialed on Linzess 145 mcg with minimal improvement.  Changed to Trulance and states this did not help either.    Colon cancer screening: Reports last colonoscopy 10 years ago.  Normal besides diverticulosis.  I do not have access to this report however.  No family history of colorectal malignancy.  No melena hematochezia.  No unintentional weight loss.  Patient has a history of carotid artery stenosis s/p right TCAR on 05/28/22 by Dr. Karin Lieu.  Currently on ASA, clopidogrel, statin.  Past Medical History:  Diagnosis Date   Anxiety disorder    Atherosclerosis    COPD (chronic obstructive pulmonary disease) (HCC)    Depression    Fibromyalgia    GERD (gastroesophageal reflux disease)    Herpes    Hypercholesterolemia    Hypertension    Neuropathy    Optic atrophy    PONV (postoperative nausea and vomiting)    Prediabetes    Senile purpura (HCC)    Stroke [facility])     Past Surgical History:  Procedure Laterality Date   ABDOMINAL HYSTERECTOMY  1994   AORTIC ARCH ANGIOGRAPHY N/A 05/20/2022   Procedure: AORTIC ARCH ANGIOGRAPHY;  Surgeon: Victorino Sparrow, MD;  Location: [facility]  INVASIVE CV LAB;  Service: Cardiovascular;  Laterality: N/A;   CAROTID ANGIOGRAPHY N/A 05/20/2022   Procedure: CAROTID ANGIOGRAPHY;  Surgeon: Victorino Sparrow, MD;  Location: [facility] INVASIVE CV LAB;  Service: Cardiovascular;  Laterality: N/A;   CARPAL TUNNEL RELEASE  2013,2011   right and left   EXCISION NASAL MASS Left 09/25/2013   Procedure: EXCISION NASAL CANCER WITH SPLIT THICKNESS SKIN GRAFT FROM RIGHT THIGH;  Surgeon: Darletta Moll, MD;  Location: [facility];  Service: ENT;  Laterality: Left;   RIGHT OOPHORECTOMY     THUMB ARTHROSCOPY     right   TONSILLECTOMY     TRANSCAROTID ARTERY REVASCULARIZATION  Right 05/28/2022   Procedure: Transcarotid Artery Revascularization;  Surgeon: Victorino Sparrow, MD;  Location: [facility] OR;  Service: Vascular;  Laterality: Right;   TUBAL LIGATION      Current Outpatient Medications  Medication Sig Dispense Refill   albuterol (VENTOLIN HFA) 108 (90 Base) MCG/ACT inhaler Inhale into the lungs.     ALPRAZolam (XANAX) 0.25 MG tablet Take 1 tablet (0.25 mg total) by mouth at bedtime as needed for anxiety. 15 tablet 0   amLODipine (NORVASC) 5 MG tablet Take 5 mg by mouth daily.     aspirin EC 325 MG tablet Take 325 mg by mouth daily.     clopidogrel (PLAVIX) 75 MG tablet Take 75 mg by mouth daily.     lisinopril (ZESTRIL) 40 MG tablet Take 40 mg  by mouth daily.     pantoprazole (PROTONIX) 40 MG tablet Take 1 tablet (40 mg total) by mouth daily as needed (reflux). 30 tablet 0   rosuvastatin (CRESTOR) 40 MG tablet Take 1 tablet (40 mg total) by mouth daily. 30 tablet 5   topiramate (TOPAMAX) 25 MG tablet Take 1 tablet (25 mg total) by mouth daily. 30 tablet 0   TRULANCE 3 MG TABS Take 1 tablet by mouth daily.     No current facility-administered medications for this visit.    Allergies as of 10/14/2023 - Review Complete 10/14/2023  Allergen Reaction Noted   Trazodone and nefazodone Hives 04/03/2013    Family History  Problem Relation Age of Onset    Emphysema Mother    Obesity Mother    Heart disease Father    High blood pressure Father    Cancer Sister        cervical   Stroke Neg Hx     Social History   Socioeconomic History   Marital status: Divorced    Spouse name: Not on file   Number of children: Not on file   Years of education: Not on file   Highest education level: Not on file  Occupational History   Occupation: Disabled  Tobacco Use   Smoking status: Former    Current packs/day: 0.00    Types: Cigarettes    Quit date: 05/17/2022    Years since quitting: 1.4    Passive exposure: Never   Smokeless tobacco: Never   Tobacco comments:    Last time smoking was 05/20/22.  Vaping Use   Vaping status: Never Used  Substance and Sexual Activity   Alcohol use: No   Drug use: No   Sexual activity: Yes    Birth control/protection: Surgical  Other Topics Concern   Not on file  Social History Narrative   Not on file   Social Determinants of Health   Financial Resource Strain: Not on file  Food Insecurity: Not on file  Transportation Needs: Not on file  Physical Activity: Not on file  Stress: Not on file  Social Connections: Not on file  Intimate Partner Violence: Not on file    Subjective: Review of Systems  Constitutional:  Negative for chills and fever.  HENT:  Negative for congestion and hearing loss.   Eyes:  Negative for blurred vision and double vision.  Respiratory:  Negative for cough and shortness of breath.   Cardiovascular:  Negative for chest pain and palpitations.  Gastrointestinal:  Negative for abdominal pain, blood in stool, constipation, diarrhea, heartburn, melena and vomiting.  Genitourinary:  Negative for dysuria and urgency.  Musculoskeletal:  Negative for joint pain and myalgias.  Skin:  Negative for itching and rash.  Neurological:  Negative for dizziness and headaches.  Psychiatric/Behavioral:  Negative for depression. The patient is not nervous/anxious.         Objective: BP (!) 112/58   Pulse (!) 57   Temp 98.6 F (37 C)   Ht 4\' 10"  (1.473 m)   Wt 163 lb 6.4 oz (74.1 kg)   BMI 34.15 kg/m  Physical Exam Constitutional:      Appearance: Normal appearance.  HENT:     Head: Normocephalic and atraumatic.  Eyes:     Extraocular Movements: Extraocular movements intact.     Conjunctiva/sclera: Conjunctivae normal.  Cardiovascular:     Rate and Rhythm: Normal rate and regular rhythm.  Pulmonary:     Effort: Pulmonary effort is normal.  Breath sounds: Normal breath sounds.  Abdominal:     General: Bowel sounds are normal.     Palpations: Abdomen is soft.  Musculoskeletal:        General: No swelling. Normal range of motion.     Cervical back: Normal range of motion and neck supple.  Skin:    General: Skin is warm and dry.     Coloration: Skin is not jaundiced.  Neurological:     General: No focal deficit present.     Mental Status: She is alert and oriented to person, place, and time.  Psychiatric:        Mood and Affect: Mood normal.        Behavior: Behavior normal.      Assessment: *Colon cancer screening *Chronic idiopathic constipation *Chronic GERD *High risk medication use-clopidogrel  Plan: Will schedule for screening colonoscopy.The risks including infection, bleed, or perforation as well as benefits, limitations, alternatives and imponderables have been reviewed with the patient. Questions have been answered. All parties agreeable.  Patient will need to hold Plavix x 5 days prior to procedure.  Will reach out to vascular in this regard.  Appreciate their help immensely.  Counseled patient on the slight increased risk of cardiovascular event during this 5-day.  She understands and is agreeable.  For patient's constipation, previously trialed on Linzess 145 mcg with minimal improvement.  Trulance did not help.  Will give samples of Linzess 290 mcg daily and see how she does.  Call with update next week and I  can send in formal prescription if improved.  Chronic GERD well-controlled on pantoprazole.  Will continue.  No alarm symptoms today to warrant further investigation with upper endoscopy.  Thank you Lupita Raider for the kind referral  10/14/2023 10:48 AM   Disclaimer: This note was dictated with voice recognition software. Similar sounding words can inadvertently be transcribed and may not be corrected upon review.

## 2023-10-15 ENCOUNTER — Other Ambulatory Visit: Payer: Self-pay

## 2023-10-15 ENCOUNTER — Encounter: Payer: Self-pay | Admitting: Internal Medicine

## 2023-10-15 DIAGNOSIS — I6523 Occlusion and stenosis of bilateral carotid arteries: Secondary | ICD-10-CM

## 2023-10-18 NOTE — Telephone Encounter (Signed)
Will schedule pt once we get providers January schedule.

## 2023-10-20 DIAGNOSIS — Z23 Encounter for immunization: Secondary | ICD-10-CM | POA: Diagnosis not present

## 2023-10-20 DIAGNOSIS — E785 Hyperlipidemia, unspecified: Secondary | ICD-10-CM | POA: Diagnosis not present

## 2023-10-20 DIAGNOSIS — J302 Other seasonal allergic rhinitis: Secondary | ICD-10-CM | POA: Diagnosis not present

## 2023-10-20 DIAGNOSIS — K219 Gastro-esophageal reflux disease without esophagitis: Secondary | ICD-10-CM | POA: Diagnosis not present

## 2023-10-20 DIAGNOSIS — I10 Essential (primary) hypertension: Secondary | ICD-10-CM | POA: Diagnosis not present

## 2023-10-20 DIAGNOSIS — G4700 Insomnia, unspecified: Secondary | ICD-10-CM | POA: Diagnosis not present

## 2023-10-20 DIAGNOSIS — I739 Peripheral vascular disease, unspecified: Secondary | ICD-10-CM | POA: Diagnosis not present

## 2023-10-20 DIAGNOSIS — R5383 Other fatigue: Secondary | ICD-10-CM | POA: Diagnosis not present

## 2023-10-20 DIAGNOSIS — R0600 Dyspnea, unspecified: Secondary | ICD-10-CM | POA: Diagnosis not present

## 2023-10-20 DIAGNOSIS — Z8673 Personal history of transient ischemic attack (TIA), and cerebral infarction without residual deficits: Secondary | ICD-10-CM | POA: Diagnosis not present

## 2023-10-20 DIAGNOSIS — K5904 Chronic idiopathic constipation: Secondary | ICD-10-CM | POA: Diagnosis not present

## 2023-10-26 ENCOUNTER — Other Ambulatory Visit: Payer: Self-pay

## 2023-10-29 ENCOUNTER — Telehealth: Payer: Self-pay | Admitting: Internal Medicine

## 2023-10-29 NOTE — Telephone Encounter (Signed)
Patient stopped by the office to see if Linzess could be called in to [facility].  She said at her appt 10/14/23 she was given samples and it has worked and would like to get a prescription for it.

## 2023-10-29 NOTE — Telephone Encounter (Signed)
Pt was seen on 10/14/2023 and was given samples of Linzess . Pt would like a formal Rx sent to [facility]

## 2023-11-01 NOTE — Telephone Encounter (Signed)
Patient has attached last had TCS 06/20/14.

## 2023-11-02 ENCOUNTER — Other Ambulatory Visit: Payer: Self-pay | Admitting: Internal Medicine

## 2023-11-02 MED ORDER — LINACLOTIDE 290 MCG PO CAPS: 290.0000 ug | ORAL_CAPSULE | Freq: Every day | ORAL | 3 refills | Status: DC

## 2023-11-02 NOTE — Telephone Encounter (Signed)
Pt Diamond Adams again today. I phoned back and again no ans, just vm. I left her a vm stating I had left you a message regarding sending in a formal script for Linzess and once you do this I will call her back. Please advise

## 2023-11-02 NOTE — Telephone Encounter (Signed)
Script sent to pharmacy, thank you

## 2023-11-03 NOTE — Telephone Encounter (Signed)
Noted  Pt notified of her Rx being sent in through her MyChart

## 2023-11-08 DIAGNOSIS — I10 Essential (primary) hypertension: Secondary | ICD-10-CM | POA: Diagnosis not present

## 2023-12-01 ENCOUNTER — Ambulatory Visit (INDEPENDENT_AMBULATORY_CARE_PROVIDER_SITE_OTHER): Payer: 59 | Admitting: Internal Medicine

## 2023-12-01 ENCOUNTER — Encounter: Payer: Self-pay | Admitting: Internal Medicine

## 2023-12-01 ENCOUNTER — Encounter: Payer: Self-pay | Admitting: *Deleted

## 2023-12-01 VITALS — BP 125/77 | HR 65 | Temp 97.5°F | Ht <= 58 in | Wt 158.8 lb

## 2023-12-01 DIAGNOSIS — K219 Gastro-esophageal reflux disease without esophagitis: Secondary | ICD-10-CM

## 2023-12-01 DIAGNOSIS — Z79899 Other long term (current) drug therapy: Secondary | ICD-10-CM

## 2023-12-01 DIAGNOSIS — Z8673 Personal history of transient ischemic attack (TIA), and cerebral infarction without residual deficits: Secondary | ICD-10-CM

## 2023-12-01 DIAGNOSIS — Z1211 Encounter for screening for malignant neoplasm of colon: Secondary | ICD-10-CM

## 2023-12-01 DIAGNOSIS — K581 Irritable bowel syndrome with constipation: Secondary | ICD-10-CM | POA: Diagnosis not present

## 2023-12-01 DIAGNOSIS — K5904 Chronic idiopathic constipation: Secondary | ICD-10-CM

## 2023-12-01 NOTE — Progress Notes (Signed)
 Primary Care Physician:  Hyacinth Honey, NP Primary Gastroenterologist:  Dr. Cindie  Chief Complaint  Patient presents with   Constipation    Patient here today due to issues with constipation. She is currently on Linzess  290 mcg daily, and she says she still feels she is unable to make a complete bm, she is also taking senna once per day. [name] is controlled on pantoprazole  40 mg once per day.    HPI:   Diamond Adams is a 63 y.o. female who presents to the clinic today for follow up visit.  Chronic GERD: Well-controlled on pantoprazole  40 mg daily.  Notes associated bloating in this regard though stopped drinking sodas and her bloating has improved drastically.  Denies any dysphagia odynophagia.  No epigastric or chest pain.  Chronic constipation: Progressively worsening.  Notes bowel movements most mornings approximately an hour after taking Linzess .  Incomplete evacuation.  Denies any hard stools.  Does note occasional straining.  Also notes increased pressure in her rectum prior to bowel movements.  Trialed on Trulance prior did not help.  Currently taking Linzess  290 mcg daily.  Colon cancer screening: Last colonoscopy 06/20/2014, normal besides diverticulosis and external hemorrhoids.  No family history of colorectal malignancy.  No melena hematochezia.  No unintentional weight loss.  Patient has a history of carotid artery stenosis s/p right TCAR on 05/28/22 by Dr. Lanis.  Currently on ASA, clopidogrel , statin.  Past Medical History:  Diagnosis Date   Anxiety disorder    Atherosclerosis    COPD (chronic obstructive pulmonary disease) (HCC)    Depression    Fibromyalgia    GERD (gastroesophageal reflux disease)    Herpes    Hypercholesterolemia    Hypertension    Neuropathy    Optic atrophy    PONV (postoperative nausea and vomiting)    Prediabetes    Senile purpura (HCC)    Stroke [facility])     Past Surgical History:  Procedure Laterality Date   ABDOMINAL HYSTERECTOMY   1994   AORTIC ARCH ANGIOGRAPHY N/A 05/20/2022   Procedure: AORTIC ARCH ANGIOGRAPHY;  Surgeon: Lanis Fonda BRAVO, MD;  Location: [facility] INVASIVE CV LAB;  Service: Cardiovascular;  Laterality: N/A;   CAROTID ANGIOGRAPHY N/A 05/20/2022   Procedure: CAROTID ANGIOGRAPHY;  Surgeon: Lanis Fonda BRAVO, MD;  Location: [facility] INVASIVE CV LAB;  Service: Cardiovascular;  Laterality: N/A;   CARPAL TUNNEL RELEASE  2013,2011   right and left   EXCISION NASAL MASS Left 09/25/2013   Procedure: EXCISION NASAL CANCER WITH SPLIT THICKNESS SKIN GRAFT FROM RIGHT THIGH;  Surgeon: Ana LELON Moccasin, MD;  Location: [facility];  Service: ENT;  Laterality: Left;   RIGHT OOPHORECTOMY     THUMB ARTHROSCOPY     right   TONSILLECTOMY     TRANSCAROTID ARTERY REVASCULARIZATION  Right 05/28/2022   Procedure: Transcarotid Artery Revascularization;  Surgeon: Lanis Fonda BRAVO, MD;  Location: [facility] OR;  Service: Vascular;  Laterality: Right;   TUBAL LIGATION      Current Outpatient Medications  Medication Sig Dispense Refill   albuterol  (VENTOLIN  HFA) 108 (90 Base) MCG/ACT inhaler Inhale into the lungs.     ALPRAZolam  (XANAX ) 0.25 MG tablet Take 1 tablet (0.25 mg total) by mouth at bedtime as needed for anxiety. 15 tablet 0   aspirin  EC 325 MG tablet Take 325 mg by mouth daily.     clopidogrel  (PLAVIX ) 75 MG tablet Take 75 mg by mouth daily.     linaclotide  (LINZESS ) 290 MCG  CAPS capsule Take 1 capsule (290 mcg total) by mouth daily before breakfast. 90 capsule 3   lisinopril  (ZESTRIL ) 40 MG tablet Take 40 mg by mouth daily.     pantoprazole  (PROTONIX ) 40 MG tablet Take 1 tablet (40 mg total) by mouth daily as needed (reflux). 30 tablet 0   rosuvastatin  (CRESTOR ) 40 MG tablet Take 1 tablet (40 mg total) by mouth daily. 30 tablet 5   senna (SENOKOT) 8.6 MG TABS tablet Take 1 tablet by mouth daily.     No current facility-administered medications for this visit.    Allergies as of 12/01/2023 - Review Complete 12/01/2023  Allergen  Reaction Noted   Trazodone and nefazodone Hives 04/03/2013    Family History  Problem Relation Age of Onset   Emphysema Mother    Obesity Mother    Heart disease Father    High blood pressure Father    Cancer Sister        cervical   Stroke Neg Hx     Social History   Socioeconomic History   Marital status: Divorced    Spouse name: Not on file   Number of children: Not on file   Years of education: Not on file   Highest education level: Not on file  Occupational History   Occupation: Disabled  Tobacco Use   Smoking status: Former    Current packs/day: 0.00    Types: Cigarettes    Quit date: 05/17/2022    Years since quitting: 1.5    Passive exposure: Never   Smokeless tobacco: Never   Tobacco comments:    Last time smoking was 05/20/22.  Vaping Use   Vaping status: Never Used  Substance and Sexual Activity   Alcohol use: No   Drug use: No   Sexual activity: Yes    Birth control/protection: Surgical  Other Topics Concern   Not on file  Social History Narrative   Not on file   Social Drivers of Health   Financial Resource Strain: Not on file  Food Insecurity: Not on file  Transportation Needs: Not on file  Physical Activity: Not on file  Stress: Not on file  Social Connections: Not on file  Intimate Partner Violence: Not on file    Subjective: Review of Systems  Constitutional:  Negative for chills and fever.  HENT:  Negative for congestion and hearing loss.   Eyes:  Negative for blurred vision and double vision.  Respiratory:  Negative for cough and shortness of breath.   Cardiovascular:  Negative for chest pain and palpitations.  Gastrointestinal:  Negative for abdominal pain, blood in stool, constipation, diarrhea, heartburn, melena and vomiting.  Genitourinary:  Negative for dysuria and urgency.  Musculoskeletal:  Negative for joint pain and myalgias.  Skin:  Negative for itching and rash.  Neurological:  Negative for dizziness and headaches.   Psychiatric/Behavioral:  Negative for depression. The patient is not nervous/anxious.        Objective: BP 125/77 (BP Location: Left Arm, Patient Position: Sitting, Cuff Size: Normal)   Pulse 65   Temp (!) 97.5 F (36.4 C) (Temporal)   Ht 4' 10 (1.473 m)   Wt 158 lb 12.8 oz (72 kg)   BMI 33.19 kg/m  Physical Exam Constitutional:      Appearance: Normal appearance.  HENT:     Head: Normocephalic and atraumatic.  Eyes:     Extraocular Movements: Extraocular movements intact.     Conjunctiva/sclera: Conjunctivae normal.  Cardiovascular:     Rate  and Rhythm: Normal rate and regular rhythm.  Pulmonary:     Effort: Pulmonary effort is normal.     Breath sounds: Normal breath sounds.  Abdominal:     General: Bowel sounds are normal.     Palpations: Abdomen is soft.  Musculoskeletal:        General: No swelling. Normal range of motion.     Cervical back: Normal range of motion and neck supple.  Skin:    General: Skin is warm and dry.     Coloration: Skin is not jaundiced.  Neurological:     General: No focal deficit present.     Mental Status: She is alert and oriented to person, place, and time.  Psychiatric:        Mood and Affect: Mood normal.        Behavior: Behavior normal.      Assessment/Plan:  1.  Chronic GERD-well-controlled on pantoprazole .  Will continue.  2.  IBS with constipation-symptoms improved on Linzess  290 mcg though still having incomplete evacuation.  Continue on Linzess .  Counseled on drink at least 80 ounces of water  daily.  Trial of Benefiber.  May need to consider anorectal manometry.  3.  Colon cancer screening- Will schedule for screening colonoscopy.The risks including infection, bleed, or perforation as well as benefits, limitations, alternatives and imponderables have been reviewed with the patient. Questions have been answered. All parties agreeable.  Patient is apprehensive to hold her Plavix  given her history of stroke.  She can  continue this.  Recommend she decrease her aspirin  to 81 mg daily for 7 days prior to colonoscopy.  Discussed that if I encounter a large lesion requiring removal, we will need to repeat colonoscopy later on off of Plavix  and she is understanding and agreeable.  4.  High risk medication use with Plavix -see above  12/01/2023 9:43 AM   Disclaimer: This note was dictated with voice recognition software. Similar sounding words can inadvertently be transcribed and may not be corrected upon review.

## 2023-12-01 NOTE — Patient Instructions (Signed)
 We will schedule you for colonoscopy for colon cancer screening purposes.  Okay to continue on your Plavix  for the procedure, if I do find large polyp that needs removed, we will need to repeat colonoscopy later off the Plavix .  Recommend decreasing your aspirin  to 81 mg daily for the 7 days leading up to your colonoscopy.  Continue on Linzess  290 mcg daily.  Recommend at least 80 ounces of water  daily.  It was very nice seeing you again today.  I hope you have a fantastic birthday.  Dr. Cindie

## 2023-12-02 ENCOUNTER — Encounter: Payer: Self-pay | Admitting: *Deleted

## 2023-12-14 DIAGNOSIS — J069 Acute upper respiratory infection, unspecified: Secondary | ICD-10-CM | POA: Diagnosis not present

## 2023-12-14 DIAGNOSIS — Z87891 Personal history of nicotine dependence: Secondary | ICD-10-CM | POA: Diagnosis not present

## 2023-12-14 DIAGNOSIS — R062 Wheezing: Secondary | ICD-10-CM | POA: Diagnosis not present

## 2023-12-14 DIAGNOSIS — R059 Cough, unspecified: Secondary | ICD-10-CM | POA: Diagnosis not present

## 2023-12-17 NOTE — Patient Instructions (Signed)
ANNETA ROUNDS  12/17/2023     @PREFPERIOPPHARMACY @   Your procedure is scheduled on  12/27/2023.    Report to [facility] at  0600 A.M.   Call this number if you have problems the morning of surgery:  [phone]  If you experience any cold or flu symptoms such as cough, fever, chills, shortness of breath, etc. between now and your scheduled surgery, please notify us at the above number.   Remember:      Use your inhaler if needed before your procedure. Bring your rescue inhaler with you.        Decrease your aspirin to 81 mg on or before 12/19/2023.   Follow the diet and prep instructions given to you by the office.   You may drink clear liquids until 0330 am on 12/27/2023.      Clear liquids allowed are:                    Water, Juice (No red color; non-citric and without pulp; diabetics please choose diet or no sugar options), Carbonated beverages (diabetics please choose diet or no sugar options), Clear Tea (No creamer, milk, or cream, including half & half and powdered creamer), Black Coffee Only (No creamer, milk or cream, including half & half and powdered creamer), and Clear Sports drink (No red color; diabetics please choose diet or no sugar options)    Take these medicines the morning of surgery with A SIP OF WATER                                           pantoprazole.    Do not wear jewelry, make-up or nail polish, including gel polish,  artificial nails, or any other type of covering on natural nails (fingers and  toes).  Do not wear lotions, powders, or perfumes, or deodorant.  Do not shave 48 hours prior to surgery.  Men may shave face and neck.  Do not bring valuables to the hospital.  [facility] is not responsible for any belongings or valuables.  Contacts, dentures or bridgework may not be worn into surgery.  Leave your suitcase in the car.  After surgery it may be brought to your room.  For patients admitted to the hospital, discharge time will be  determined by your treatment team.  Patients discharged the day of surgery will not be allowed to drive home and must have someone with them for 24 hours.    Special instructions:   DO NOT smoke tobacco or vape for 24 hours before your procedure.  Please read over the following fact sheets that you were given. Anesthesia Post-op Instructions and Care and Recovery After Surgery      Colonoscopy, Adult, Care After The following information offers guidance on how to care for yourself after your procedure. Your health care provider may also give you more specific instructions. If you have problems or questions, contact your health care provider. What can I expect after the procedure? After the procedure, it is common to have: A small amount of blood in your stool for 24 hours after the procedure. Some gas. Mild cramping or bloating of your abdomen. Follow these instructions at home: Eating and drinking  Drink enough fluid to keep your urine pale yellow. Follow instructions from your health care provider about eating or drinking restrictions.  Resume your normal diet as told by your health care provider. Avoid heavy or fried foods that are hard to digest. Activity Rest as told by your health care provider. Avoid sitting for a long time without moving. Get up to take short walks every 1-2 hours. This is important to improve blood flow and breathing. Ask for help if you feel weak or unsteady. Return to your normal activities as told by your health care provider. Ask your health care provider what activities are safe for you. Managing cramping and bloating  Try walking around when you have cramps or feel bloated. If directed, apply heat to your abdomen as told by your health care provider. Use the heat source that your health care provider recommends, such as a moist heat pack or a heating pad. Place a towel between your skin and the heat source. Leave the heat on for 20-30 minutes. Remove  the heat if your skin turns bright red. This is especially important if you are unable to feel pain, heat, or cold. You have a greater risk of getting burned. General instructions If you were given a sedative during the procedure, it can affect you for several hours. Do not drive or operate machinery until your health care provider says that it is safe. For the first 24 hours after the procedure: Do not sign important documents. Do not drink alcohol. Do your regular daily activities at a slower pace than normal. Eat soft foods that are easy to digest. Take over-the-counter and prescription medicines only as told by your health care provider. Keep all follow-up visits. This is important. Contact a health care provider if: You have blood in your stool 2-3 days after the procedure. Get help right away if: You have more than a small spotting of blood in your stool. You have large blood clots in your stool. You have swelling of your abdomen. You have nausea or vomiting. You have a fever. You have increasing pain in your abdomen that is not relieved with medicine. These symptoms may be an emergency. Get help right away. Call 911. Do not wait to see if the symptoms will go away. Do not drive yourself to the hospital. Summary After the procedure, it is common to have a small amount of blood in your stool. You may also have mild cramping and bloating of your abdomen. If you were given a sedative during the procedure, it can affect you for several hours. Do not drive or operate machinery until your health care provider says that it is safe. Get help right away if you have a lot of blood in your stool, nausea or vomiting, a fever, or increased pain in your abdomen. This information is not intended to replace advice given to you by your health care provider. Make sure you discuss any questions you have with your health care provider. Document Revised: 12/22/2022 Document Reviewed: 07/02/2021 Elsevier  Patient Education  2024 [employer]Monitored Anesthesia Care, Care After The following information offers guidance on how to care for yourself after your procedure. Your health care provider may also give you more specific instructions. If you have problems or questions, contact your health care provider. What can I expect after the procedure? After the procedure, it is common to have: Tiredness. Little or no memory about what happened during or after the procedure. Impaired judgment when it comes to making decisions. Nausea or vomiting. Some trouble with balance. Follow these instructions at home: For the time period you were told by  your health care provider:  Rest. Do not participate in activities where you could fall or become injured. Do not drive or use machinery. Do not drink alcohol. Do not take sleeping pills or medicines that cause drowsiness. Do not make important decisions or sign legal documents. Do not take care of children on your own. Medicines Take over-the-counter and prescription medicines only as told by your health care provider. If you were prescribed antibiotics, take them as told by your health care provider. Do not stop using the antibiotic even if you start to feel better. Eating and drinking Follow instructions from your health care provider about what you may eat and drink. Drink enough fluid to keep your urine pale yellow. If you vomit: Drink clear fluids slowly and in small amounts as you are able. Clear fluids include water, ice chips, low-calorie sports drinks, and fruit juice that has water added to it (diluted fruit juice). Eat light and bland foods in small amounts as you are able. These foods include bananas, applesauce, rice, lean meats, toast, and crackers. General instructions  Have a responsible adult stay with you for the time you are told. It is important to have someone help care for you until you are awake and alert. If you have sleep  apnea, surgery and some medicines can increase your risk for breathing problems. Follow instructions from your health care provider about wearing your sleep device: When you are sleeping. This includes during daytime naps. While taking prescription pain medicines, sleeping medicines, or medicines that make you drowsy. Do not use any products that contain nicotine or tobacco. These products include cigarettes, chewing tobacco, and vaping devices, such as e-cigarettes. If you need help quitting, ask your health care provider. Contact a health care provider if: You feel nauseous or vomit every time you eat or drink. You feel light-headed. You are still sleepy or having trouble with balance after 24 hours. You get a rash. You have a fever. You have redness or swelling around the IV site. Get help right away if: You have trouble breathing. You have new confusion after you get home. These symptoms may be an emergency. Get help right away. Call 911. Do not wait to see if the symptoms will go away. Do not drive yourself to the hospital. This information is not intended to replace advice given to you by your health care provider. Make sure you discuss any questions you have with your health care provider. Document Revised: 04/06/2022 Document Reviewed: 04/06/2022 Elsevier Patient Education  2024 [employer].

## 2023-12-20 ENCOUNTER — Encounter (HOSPITAL_COMMUNITY): Payer: Self-pay

## 2023-12-20 ENCOUNTER — Encounter (HOSPITAL_COMMUNITY)
Admission: RE | Admit: 2023-12-20 | Discharge: 2023-12-20 | Disposition: A | Discharge status: Home / Self Care | Payer: 59 | Source: Ambulatory Visit | Attending: Internal Medicine | Admitting: Internal Medicine

## 2023-12-20 VITALS — BP 125/77 | HR 65 | Temp 97.5°F | Resp 18 | Ht <= 58 in | Wt 158.8 lb

## 2023-12-20 DIAGNOSIS — E7800 Pure hypercholesterolemia, unspecified: Secondary | ICD-10-CM | POA: Diagnosis not present

## 2023-12-20 DIAGNOSIS — Z8249 Family history of ischemic heart disease and other diseases of the circulatory system: Secondary | ICD-10-CM | POA: Diagnosis not present

## 2023-12-20 DIAGNOSIS — K219 Gastro-esophageal reflux disease without esophagitis: Secondary | ICD-10-CM | POA: Diagnosis not present

## 2023-12-20 DIAGNOSIS — J9601 Acute respiratory failure with hypoxia: Secondary | ICD-10-CM | POA: Diagnosis not present

## 2023-12-20 DIAGNOSIS — M797 Fibromyalgia: Secondary | ICD-10-CM | POA: Diagnosis not present

## 2023-12-20 DIAGNOSIS — E876 Hypokalemia: Secondary | ICD-10-CM | POA: Insufficient documentation

## 2023-12-20 DIAGNOSIS — R7303 Prediabetes: Secondary | ICD-10-CM | POA: Insufficient documentation

## 2023-12-20 DIAGNOSIS — Z825 Family history of asthma and other chronic lower respiratory diseases: Secondary | ICD-10-CM | POA: Diagnosis not present

## 2023-12-20 DIAGNOSIS — Z1152 Encounter for screening for COVID-19: Secondary | ICD-10-CM | POA: Diagnosis not present

## 2023-12-20 DIAGNOSIS — J441 Chronic obstructive pulmonary disease with (acute) exacerbation: Secondary | ICD-10-CM | POA: Diagnosis not present

## 2023-12-20 DIAGNOSIS — I10 Essential (primary) hypertension: Secondary | ICD-10-CM | POA: Diagnosis not present

## 2023-12-20 DIAGNOSIS — Z7982 Long term (current) use of aspirin: Secondary | ICD-10-CM | POA: Diagnosis not present

## 2023-12-20 DIAGNOSIS — I700 Atherosclerosis of aorta: Secondary | ICD-10-CM | POA: Diagnosis not present

## 2023-12-20 DIAGNOSIS — Z8673 Personal history of transient ischemic attack (TIA), and cerebral infarction without residual deficits: Secondary | ICD-10-CM | POA: Diagnosis not present

## 2023-12-20 DIAGNOSIS — Z87891 Personal history of nicotine dependence: Secondary | ICD-10-CM | POA: Diagnosis not present

## 2023-12-20 DIAGNOSIS — Z01812 Encounter for preprocedural laboratory examination: Secondary | ICD-10-CM | POA: Insufficient documentation

## 2023-12-20 LAB — BASIC METABOLIC PANEL
Anion gap: 8 (ref 5–15)
BUN: 17 mg/dL (ref 8–23)
CO2: 25 mmol/L (ref 22–32)
Calcium: 9.4 mg/dL (ref 8.9–10.3)
Chloride: 105 mmol/L (ref 98–111)
Creatinine, Ser: 0.66 mg/dL (ref 0.44–1.00)
GFR, Estimated: >60 mL/min (ref >60)
Glucose, Bld: 92 mg/dL (ref 70–99)
Potassium: 3.6 mmol/L (ref 3.5–5.1)
Sodium: 138 mmol/L (ref 135–145)

## 2023-12-21 ENCOUNTER — Telehealth: Payer: Self-pay | Admitting: *Deleted

## 2023-12-21 ENCOUNTER — Ambulatory Visit
Admission: EM | Admit: 2023-12-21 | Discharge: 2023-12-21 | Disposition: A | Discharge status: Home / Self Care | Payer: 59 | Attending: Nurse Practitioner | Admitting: Nurse Practitioner

## 2023-12-21 DIAGNOSIS — J441 Chronic obstructive pulmonary disease with (acute) exacerbation: Secondary | ICD-10-CM

## 2023-12-21 MED ORDER — DOXYCYCLINE HYCLATE 100 MG PO CAPS: 100.0000 mg | ORAL_CAPSULE | Freq: Two times a day (BID) | ORAL | 0 refills | Status: DC

## 2023-12-21 MED ORDER — IPRATROPIUM-ALBUTEROL 0.5-2.5 (3) MG/3ML IN SOLN
3.0000 mL | Freq: Once | RESPIRATORY_TRACT | Status: AC
  Administered 2023-12-21: 3 mL via RESPIRATORY_TRACT

## 2023-12-21 MED ORDER — METHYLPREDNISOLONE ACETATE 40 MG/ML IJ SUSP
40.0000 mg | Freq: Once | INTRAMUSCULAR | Status: AC
Start: 2023-12-21 — End: 2023-12-21
  Administered 2023-12-21: 40 mg via INTRAMUSCULAR

## 2023-12-21 MED ORDER — PREDNISONE 20 MG PO TABS: 40.0000 mg | ORAL_TABLET | Freq: Every day | ORAL | 0 refills | Status: AC

## 2023-12-21 MED ORDER — BENZONATATE 100 MG PO CAPS: 100.0000 mg | ORAL_CAPSULE | Freq: Three times a day (TID) | ORAL | 0 refills | Status: DC | PRN

## 2023-12-21 NOTE — ED Provider Notes (Signed)
[facility]    CSN: [id number] Arrival date & time: 12/21/23  1527      History   Chief Complaint No chief complaint on file.   HPI Diamond Adams is a 63 y.o. female.   Patient presents today with 1 day history of congested cough, shortness of breath, wheezing, chest tightness, sore throat from coughing so much.  No fever, body aches or chills, stuffy nose, headache, ear pain, abdominal pain, nausea/vomiting, diarrhea, change in appetite, or fatigue.  Reports she was treated last week by her PCP for a COPD exacerbation with prednisone and azithromycin.  She has used rescue inhaler today for symptoms without much improvement.  Has not taken anything over-the-counter so far.    Past Medical History:  Diagnosis Date   Anxiety disorder    Atherosclerosis    COPD (chronic obstructive pulmonary disease) (HCC)    Depression    Fibromyalgia    GERD (gastroesophageal reflux disease)    Herpes    Hypercholesterolemia    Hypertension    Neuropathy    Optic atrophy    PONV (postoperative nausea and vomiting)    Prediabetes    Senile purpura (HCC)    Stroke [facility])     Patient Active Problem List   Diagnosis Date Noted   Abnormal craving 02/22/2023   Palpitations 02/18/2023   Chest pressure 02/18/2023   Class 1 obesity with serious comorbidity and body mass index (BMI) of 32.0 to 32.9 in adult 11/30/2022   Prediabetes 11/30/2022   Excessive daytime sleepiness 10/09/2022   Carotid artery stenosis, symptomatic, right 05/28/2022   Anxiety state    Bruising 05/21/2022   Essential hypertension 05/20/2022   Hypokalemia 05/18/2022   Depression 05/18/2022   HLD (hyperlipidemia) 05/18/2022   Acute ischemic right MCA stroke (HCC) 05/18/2022   Tobacco use disorder 05/18/2022   Leukocytosis 05/18/2022    Past Surgical History:  Procedure Laterality Date   ABDOMINAL HYSTERECTOMY  1994   AORTIC ARCH ANGIOGRAPHY N/A 05/20/2022   Procedure: AORTIC ARCH ANGIOGRAPHY;   Surgeon: Victorino Sparrow, MD;  Location: [facility] INVASIVE CV LAB;  Service: Cardiovascular;  Laterality: N/A;   CAROTID ANGIOGRAPHY N/A 05/20/2022   Procedure: CAROTID ANGIOGRAPHY;  Surgeon: Victorino Sparrow, MD;  Location: [facility] INVASIVE CV LAB;  Service: Cardiovascular;  Laterality: N/A;   CARPAL TUNNEL RELEASE  2013,2011   right and left   EXCISION NASAL MASS Left 09/25/2013   Procedure: EXCISION NASAL CANCER WITH SPLIT THICKNESS SKIN GRAFT FROM RIGHT THIGH;  Surgeon: Darletta Moll, MD;  Location: [facility];  Service: ENT;  Laterality: Left;   RIGHT OOPHORECTOMY     THUMB ARTHROSCOPY     right   TONSILLECTOMY     TRANSCAROTID ARTERY REVASCULARIZATION  Right 05/28/2022   Procedure: Transcarotid Artery Revascularization;  Surgeon: Victorino Sparrow, MD;  Location: [facility];  Service: Vascular;  Laterality: Right;   TUBAL LIGATION      OB History     Gravida  3   Para  3   Term      Preterm      AB      Living  3      SAB      IAB      Ectopic      Multiple      Live Births               Home Medications    Prior to Admission medications  Medication Sig Start Date End Date Taking? Authorizing Provider  benzonatate (TESSALON) 100 MG capsule Take 1 capsule (100 mg total) by mouth 3 (three) times daily as needed for cough. Do not take with alcohol or while operating or driving heavy machinery 1/61/09  Yes Cathlean Marseilles A, NP  doxycycline (VIBRAMYCIN) 100 MG capsule Take 1 capsule (100 mg total) by mouth 2 (two) times daily for 7 days. 12/21/23 12/28/23 Yes Valentino Nose, NP  predniSONE (DELTASONE) 20 MG tablet Take 2 tablets (40 mg total) by mouth daily with breakfast for 5 days. 12/21/23 12/26/23 Yes Valentino Nose, NP  albuterol (VENTOLIN HFA) 108 (90 Base) MCG/ACT inhaler Inhale into the lungs. 03/22/23   [provider]  ALPRAZolam Prudy Feeler) 0.25 MG tablet Take 1 tablet (0.25 mg total) by mouth at bedtime as needed for anxiety. 05/26/22   Setzer,  Lynnell Jude, PA-C  aspirin EC 325 MG tablet Take 325 mg by mouth daily.    [provider]  clopidogrel (PLAVIX) 75 MG tablet Take 75 mg by mouth daily. 09/01/22   [provider]  linaclotide Karlene Einstein) 290 MCG CAPS capsule Take 1 capsule (290 mcg total) by mouth daily before breakfast. 11/02/23 11/01/24  Lanelle Bal, DO  lisinopril (ZESTRIL) 40 MG tablet Take 40 mg by mouth daily. 06/22/22   [provider]  pantoprazole (PROTONIX) 40 MG tablet Take 1 tablet (40 mg total) by mouth daily as needed (reflux). 05/26/22   Setzer, Lynnell Jude, PA-C  rosuvastatin (CRESTOR) 40 MG tablet Take 1 tablet (40 mg total) by mouth daily. 11/09/22   Victorino Sparrow, MD  senna (SENOKOT) 8.6 MG TABS tablet Take 1 tablet by mouth daily.    [provider]    Family History Family History  Problem Relation Age of Onset   Emphysema Mother    Obesity Mother    Heart disease Father    High blood pressure Father    Cancer Sister        cervical   Stroke Neg Hx     Social History Social History   Tobacco Use   Smoking status: Former    Current packs/day: 0.00    Types: Cigarettes    Quit date: 05/17/2022    Years since quitting: 1.5    Passive exposure: Never   Smokeless tobacco: Never   Tobacco comments:    Last time smoking was 05/20/22.  Vaping Use   Vaping status: Never Used  Substance Use Topics   Alcohol use: No   Drug use: No     Allergies   Trazodone and nefazodone   Review of Systems Review of Systems Per HPI  Physical Exam Triage Vital Signs ED Triage Vitals  Encounter Vitals Group     BP 12/21/23 1538 118/73     Systolic BP Percentile --      Diastolic BP Percentile --      Pulse Rate 12/21/23 1538 (!) 104     Resp 12/21/23 1538 16     Temp 12/21/23 1538 98.2 F (36.8 C)     Temp Source 12/21/23 1538 Oral     SpO2 12/21/23 1538 95 %     Weight --      Height --      Head Circumference --      Peak Flow --      Pain Score 12/21/23  1540 0     Pain Loc --      Pain Education --  Exclude from Growth Chart --    No data found.  Updated Vital Signs BP 130/69 (BP Location: Right Arm)   Pulse 86   Temp 98.2 F (36.8 C)   Resp 20   SpO2 96%   Visual Acuity Right Eye Distance:   Left Eye Distance:   Bilateral Distance:    Right Eye Near:   Left Eye Near:    Bilateral Near:     Physical Exam Vitals and nursing note reviewed.  Constitutional:      General: She is not in acute distress.    Appearance: Normal appearance. She is not ill-appearing or toxic-appearing.  HENT:     Head: Normocephalic and atraumatic.     Right Ear: Tympanic membrane, ear canal and external ear normal.     Left Ear: Tympanic membrane, ear canal and external ear normal.     Nose: No congestion or rhinorrhea.     Mouth/Throat:     Mouth: Mucous membranes are moist.     Pharynx: Oropharynx is clear. Posterior oropharyngeal erythema present. No oropharyngeal exudate.  Eyes:     General: No scleral icterus.    Extraocular Movements: Extraocular movements intact.  Cardiovascular:     Rate and Rhythm: Normal rate and regular rhythm.  Pulmonary:     Effort: Pulmonary effort is normal. No respiratory distress.     Breath sounds: Wheezing present. No rhonchi or rales.  Musculoskeletal:     Cervical back: Normal range of motion and neck supple.  Lymphadenopathy:     Cervical: No cervical adenopathy.  Skin:    General: Skin is warm and dry.     Coloration: Skin is not jaundiced or pale.     Findings: No erythema or rash.  Neurological:     Mental Status: She is alert and oriented to person, place, and time.  Psychiatric:        Behavior: Behavior is cooperative.      UC Treatments / Results  Labs (all labs ordered are listed, but only abnormal results are displayed) Labs Reviewed - No data to display  EKG   Radiology No results found.  Procedures Procedures (including critical care time)  Medications Ordered in  UC Medications  ipratropium-albuterol (DUONEB) 0.5-2.5 (3) MG/3ML nebulizer solution 3 mL (3 mLs Nebulization Given 12/21/23 1626)  methylPREDNISolone acetate (DEPO-MEDROL) injection 40 mg (40 mg Intramuscular Given 12/21/23 1624)    Initial Impression / Assessment and Plan / UC Course  I have reviewed the triage vital signs and the nursing notes.  Pertinent labs & imaging results that were available during my care of the patient were reviewed by me and considered in my medical decision making (see chart for details).   Patient is well-appearing, normotensive, afebrile, not tachycardic, not tachypneic, oxygenating well on room air.    1. COPD exacerbation (HCC) Overall, vitals and exam are stable DuoNeb, 40 mg of Depo-Medrol IM given in urgent care today with improvement in SpO2 and increased air movement bilaterally to lung fields Start scheduled albuterol, oral prednisone, doxycycline, cough suppressant, and expectorant Return and ER precautions discussed with patient  The patient was given the opportunity to ask questions.  All questions answered to their satisfaction.  The patient is in agreement to this plan.    Final Clinical Impressions(s) / UC Diagnoses   Final diagnoses:  COPD exacerbation [facility])     Discharge Instructions      We gave you a breathing treatment and injection of steroid medicine today.  Start using your albuterol inhaler every 4 hours for the next 48 hours.  In addition, start the doxycyline and take the prednisone as prescribed.  You can take the prescription cough suppressant medication as needed for cough.  Recommend guaifenesin 600 mg every 12 hours for the congestion.       ED Prescriptions     Medication Sig Dispense Auth. Provider   predniSONE (DELTASONE) 20 MG tablet Take 2 tablets (40 mg total) by mouth daily with breakfast for 5 days. 10 tablet Cathlean Marseilles A, NP   doxycycline (VIBRAMYCIN) 100 MG capsule Take 1 capsule (100 mg total) by  mouth 2 (two) times daily for 7 days. 14 capsule Cathlean Marseilles A, NP   benzonatate (TESSALON) 100 MG capsule Take 1 capsule (100 mg total) by mouth 3 (three) times daily as needed for cough. Do not take with alcohol or while operating or driving heavy machinery 21 capsule Valentino Nose, NP      PDMP not reviewed this encounter.   Valentino Nose, NP 12/21/23 [phone]

## 2023-12-21 NOTE — Discharge Instructions (Signed)
We gave you a breathing treatment and injection of steroid medicine today.  Start using your albuterol inhaler every 4 hours for the next 48 hours.  In addition, start the doxycyline and take the prednisone as prescribed.  You can take the prescription cough suppressant medication as needed for cough.  Recommend guaifenesin 600 mg every 12 hours for the congestion.

## 2023-12-21 NOTE — Telephone Encounter (Signed)
Per Britta Mccreedy in endo "called to cancel procedure due to severe respiratory infection . She will reschedule"

## 2023-12-21 NOTE — ED Triage Notes (Signed)
Pt reports yesterday she began being short of breath and has worsened today. She has some nasal congestion and a cough as well.      Has used her inhalers today. Recently was on prednisone and azithromycin

## 2023-12-23 ENCOUNTER — Other Ambulatory Visit: Payer: Self-pay

## 2023-12-23 ENCOUNTER — Inpatient Hospital Stay (HOSPITAL_COMMUNITY)
Admission: EM | Admit: 2023-12-23 | Discharge: 2023-12-25 | DRG: 189 | Disposition: A | Discharge status: Home / Self Care | Payer: 59 | Attending: Family Medicine | Admitting: Family Medicine

## 2023-12-23 ENCOUNTER — Emergency Department (HOSPITAL_COMMUNITY): Payer: 59

## 2023-12-23 ENCOUNTER — Encounter (HOSPITAL_COMMUNITY): Payer: Self-pay | Admitting: *Deleted

## 2023-12-23 DIAGNOSIS — E7800 Pure hypercholesterolemia, unspecified: Secondary | ICD-10-CM | POA: Diagnosis present

## 2023-12-23 DIAGNOSIS — Z7982 Long term (current) use of aspirin: Secondary | ICD-10-CM

## 2023-12-23 DIAGNOSIS — E876 Hypokalemia: Secondary | ICD-10-CM | POA: Diagnosis not present

## 2023-12-23 DIAGNOSIS — Z1152 Encounter for screening for COVID-19: Secondary | ICD-10-CM | POA: Diagnosis not present

## 2023-12-23 DIAGNOSIS — F411 Generalized anxiety disorder: Secondary | ICD-10-CM | POA: Diagnosis present

## 2023-12-23 DIAGNOSIS — Z8673 Personal history of transient ischemic attack (TIA), and cerebral infarction without residual deficits: Secondary | ICD-10-CM

## 2023-12-23 DIAGNOSIS — J441 Chronic obstructive pulmonary disease with (acute) exacerbation: Secondary | ICD-10-CM | POA: Diagnosis not present

## 2023-12-23 DIAGNOSIS — Z6832 Body mass index (BMI) 32.0-32.9, adult: Secondary | ICD-10-CM | POA: Diagnosis not present

## 2023-12-23 DIAGNOSIS — B009 Herpesviral infection, unspecified: Secondary | ICD-10-CM | POA: Diagnosis present

## 2023-12-23 DIAGNOSIS — M797 Fibromyalgia: Secondary | ICD-10-CM | POA: Diagnosis present

## 2023-12-23 DIAGNOSIS — Z825 Family history of asthma and other chronic lower respiratory diseases: Secondary | ICD-10-CM

## 2023-12-23 DIAGNOSIS — R7303 Prediabetes: Secondary | ICD-10-CM | POA: Diagnosis present

## 2023-12-23 DIAGNOSIS — G4719 Other hypersomnia: Secondary | ICD-10-CM | POA: Diagnosis present

## 2023-12-23 DIAGNOSIS — Z90710 Acquired absence of both cervix and uterus: Secondary | ICD-10-CM | POA: Diagnosis not present

## 2023-12-23 DIAGNOSIS — E66811 Obesity, class 1: Secondary | ICD-10-CM | POA: Diagnosis present

## 2023-12-23 DIAGNOSIS — J9601 Acute respiratory failure with hypoxia: Secondary | ICD-10-CM | POA: Diagnosis not present

## 2023-12-23 DIAGNOSIS — I10 Essential (primary) hypertension: Secondary | ICD-10-CM | POA: Diagnosis not present

## 2023-12-23 DIAGNOSIS — I700 Atherosclerosis of aorta: Secondary | ICD-10-CM | POA: Diagnosis not present

## 2023-12-23 DIAGNOSIS — Z716 Tobacco abuse counseling: Secondary | ICD-10-CM | POA: Diagnosis not present

## 2023-12-23 DIAGNOSIS — F17200 Nicotine dependence, unspecified, uncomplicated: Secondary | ICD-10-CM | POA: Diagnosis present

## 2023-12-23 DIAGNOSIS — Z8249 Family history of ischemic heart disease and other diseases of the circulatory system: Secondary | ICD-10-CM

## 2023-12-23 DIAGNOSIS — K219 Gastro-esophageal reflux disease without esophagitis: Secondary | ICD-10-CM | POA: Diagnosis not present

## 2023-12-23 DIAGNOSIS — I6521 Occlusion and stenosis of right carotid artery: Secondary | ICD-10-CM | POA: Diagnosis present

## 2023-12-23 DIAGNOSIS — F32A Depression, unspecified: Secondary | ICD-10-CM | POA: Diagnosis present

## 2023-12-23 DIAGNOSIS — Z90721 Acquired absence of ovaries, unilateral: Secondary | ICD-10-CM

## 2023-12-23 DIAGNOSIS — D72823 Leukemoid reaction: Secondary | ICD-10-CM

## 2023-12-23 DIAGNOSIS — E785 Hyperlipidemia, unspecified: Secondary | ICD-10-CM | POA: Diagnosis present

## 2023-12-23 DIAGNOSIS — D72829 Elevated white blood cell count, unspecified: Secondary | ICD-10-CM | POA: Diagnosis present

## 2023-12-23 DIAGNOSIS — K2100 Gastro-esophageal reflux disease with esophagitis, without bleeding: Secondary | ICD-10-CM | POA: Diagnosis not present

## 2023-12-23 DIAGNOSIS — Z87891 Personal history of nicotine dependence: Secondary | ICD-10-CM | POA: Diagnosis not present

## 2023-12-23 DIAGNOSIS — R002 Palpitations: Secondary | ICD-10-CM | POA: Diagnosis present

## 2023-12-23 LAB — MRSA NEXT GEN BY PCR, NASAL: MRSA by PCR Next Gen: NOT DETECTED

## 2023-12-23 LAB — BLOOD GAS, VENOUS
Acid-Base Excess: 1.6 mmol/L (ref 0.0–2.0)
Bicarbonate: 26.6 mmol/L (ref 20.0–28.0)
Drawn by: 442
O2 Saturation: 91.1 %
Patient temperature: 37.1
pCO2, Ven: 42 mm[Hg] — ABNORMAL LOW (ref 44–60)
pH, Ven: 7.41 (ref 7.25–7.43)
pO2, Ven: 60 mm[Hg] — ABNORMAL HIGH (ref 32–45)

## 2023-12-23 LAB — COMPREHENSIVE METABOLIC PANEL
ALT: 30 U/L (ref 0–44)
AST: 16 U/L (ref 15–41)
Albumin: 4.2 g/dL (ref 3.5–5.0)
Alkaline Phosphatase: 74 U/L (ref 38–126)
Anion gap: 10 (ref 5–15)
BUN: 14 mg/dL (ref 8–23)
CO2: 21 mmol/L — ABNORMAL LOW (ref 22–32)
Calcium: 9.3 mg/dL (ref 8.9–10.3)
Chloride: 104 mmol/L (ref 98–111)
Creatinine, Ser: 0.55 mg/dL (ref 0.44–1.00)
GFR, Estimated: >60 mL/min (ref >60)
Glucose, Bld: 128 mg/dL — ABNORMAL HIGH (ref 70–99)
Potassium: 3.2 mmol/L — ABNORMAL LOW (ref 3.5–5.1)
Sodium: 135 mmol/L (ref 135–145)
Total Bilirubin: 0.6 mg/dL (ref 0.0–1.2)
Total Protein: 7.3 g/dL (ref 6.5–8.1)

## 2023-12-23 LAB — CBC
HCT: 44.5 % (ref 36.0–46.0)
Hemoglobin: 15.3 g/dL — ABNORMAL HIGH (ref 12.0–15.0)
MCH: 28.2 pg (ref 26.0–34.0)
MCHC: 34.4 g/dL (ref 30.0–36.0)
MCV: 82 fL (ref 80.0–100.0)
Platelets: 358 10*3/uL (ref 150–400)
RBC: 5.43 MIL/uL — ABNORMAL HIGH (ref 3.87–5.11)
RDW: 13.2 % (ref 11.5–15.5)
WBC: 18.3 10*3/uL — ABNORMAL HIGH (ref 4.0–10.5)
nRBC: 0 % (ref 0.0–0.2)

## 2023-12-23 LAB — TROPONIN I (HIGH SENSITIVITY)
Troponin I (High Sensitivity): 2 ng/L (ref <18)
Troponin I (High Sensitivity): 3 ng/L (ref <18)

## 2023-12-23 LAB — MAGNESIUM: Magnesium: 2 mg/dL (ref 1.7–2.4)

## 2023-12-23 LAB — RESP PANEL BY RT-PCR (RSV, FLU A&B, COVID)  RVPGX2
Influenza A by PCR: NEGATIVE
Influenza B by PCR: NEGATIVE
Resp Syncytial Virus by PCR: NEGATIVE
SARS Coronavirus 2 by RT PCR: NEGATIVE

## 2023-12-23 LAB — D-DIMER, QUANTITATIVE: D-Dimer, Quant: 0.5 ug{FEU}/mL (ref 0.00–0.50)

## 2023-12-23 LAB — BRAIN NATRIURETIC PEPTIDE: B Natriuretic Peptide: 37 pg/mL (ref 0.0–100.0)

## 2023-12-23 MED ORDER — GUAIFENESIN ER 600 MG PO TB12
1200.0000 mg | ORAL_TABLET | Freq: Two times a day (BID) | ORAL | Status: DC
  Administered 2023-12-23 – 2023-12-25 (×5): 1200 mg via ORAL
  Filled 2023-12-23 (×5): qty 2

## 2023-12-23 MED ORDER — SODIUM CHLORIDE 0.9 % IV SOLN
1.0000 g | INTRAVENOUS | Status: DC
  Administered 2023-12-23 – 2023-12-25 (×3): 1 g via INTRAVENOUS
  Filled 2023-12-23 (×3): qty 10

## 2023-12-23 MED ORDER — ZOLPIDEM TARTRATE 5 MG PO TABS
5.0000 mg | ORAL_TABLET | Freq: Every evening | ORAL | Status: DC | PRN
Start: 2023-12-23 — End: 2023-12-25
  Administered 2023-12-23: 5 mg via ORAL
  Filled 2023-12-23: qty 1

## 2023-12-23 MED ORDER — ALPRAZOLAM 0.25 MG PO TABS
0.2500 mg | ORAL_TABLET | Freq: Three times a day (TID) | ORAL | Status: DC | PRN
  Administered 2023-12-23 – 2023-12-24 (×2): 0.25 mg via ORAL
  Filled 2023-12-23 (×3): qty 1

## 2023-12-23 MED ORDER — ASPIRIN 325 MG PO TBEC
325.0000 mg | DELAYED_RELEASE_TABLET | Freq: Every day | ORAL | Status: DC
Start: 2023-12-23 — End: 2023-12-25
  Administered 2023-12-23 – 2023-12-25 (×3): 325 mg via ORAL
  Filled 2023-12-23 (×3): qty 1

## 2023-12-23 MED ORDER — AZITHROMYCIN 250 MG PO TABS: 500.0000 mg | ORAL_TABLET | Freq: Every day | ORAL | Status: DC

## 2023-12-23 MED ORDER — ACETAMINOPHEN 325 MG PO TABS: 650.0000 mg | ORAL_TABLET | Freq: Four times a day (QID) | ORAL | Status: DC | PRN

## 2023-12-23 MED ORDER — FENTANYL CITRATE PF 50 MCG/ML IJ SOSY: 12.5000 ug | PREFILLED_SYRINGE | INTRAMUSCULAR | Status: DC | PRN

## 2023-12-23 MED ORDER — ROSUVASTATIN CALCIUM 20 MG PO TABS
40.0000 mg | ORAL_TABLET | Freq: Every evening | ORAL | Status: DC
  Administered 2023-12-23 – 2023-12-24 (×2): 40 mg via ORAL
  Filled 2023-12-23: qty 2
  Filled 2023-12-23: qty 4

## 2023-12-23 MED ORDER — ENOXAPARIN SODIUM 40 MG/0.4ML IJ SOSY
40.0000 mg | PREFILLED_SYRINGE | INTRAMUSCULAR | Status: DC
  Administered 2023-12-23 – 2023-12-24 (×2): 40 mg via SUBCUTANEOUS
  Filled 2023-12-23 (×2): qty 0.4

## 2023-12-23 MED ORDER — LINACLOTIDE 145 MCG PO CAPS
290.0000 ug | ORAL_CAPSULE | Freq: Every day | ORAL | Status: DC
  Administered 2023-12-24 – 2023-12-25 (×2): 290 ug via ORAL
  Filled 2023-12-23 (×2): qty 2

## 2023-12-23 MED ORDER — IPRATROPIUM-ALBUTEROL 0.5-2.5 (3) MG/3ML IN SOLN
3.0000 mL | Freq: Once | RESPIRATORY_TRACT | Status: AC
  Administered 2023-12-23: 3 mL via RESPIRATORY_TRACT
  Filled 2023-12-23: qty 3

## 2023-12-23 MED ORDER — DEXTROMETHORPHAN POLISTIREX ER 30 MG/5ML PO SUER: 30.0000 mg | Freq: Two times a day (BID) | ORAL | Status: DC | PRN

## 2023-12-23 MED ORDER — ONDANSETRON HCL 4 MG/2ML IJ SOLN: 4.0000 mg | Freq: Four times a day (QID) | INTRAMUSCULAR | Status: DC | PRN

## 2023-12-23 MED ORDER — SODIUM CHLORIDE 0.9 % IV SOLN
500.0000 mg | INTRAVENOUS | Status: DC
  Administered 2023-12-23 – 2023-12-25 (×3): 500 mg via INTRAVENOUS
  Filled 2023-12-23 (×3): qty 5

## 2023-12-23 MED ORDER — LISINOPRIL 10 MG PO TABS
40.0000 mg | ORAL_TABLET | Freq: Every day | ORAL | Status: DC
  Administered 2023-12-23 – 2023-12-25 (×3): 40 mg via ORAL
  Filled 2023-12-23 (×3): qty 4

## 2023-12-23 MED ORDER — SACCHAROMYCES BOULARDII 250 MG PO CAPS
250.0000 mg | ORAL_CAPSULE | Freq: Two times a day (BID) | ORAL | Status: DC
  Administered 2023-12-23 – 2023-12-25 (×4): 250 mg via ORAL
  Filled 2023-12-23 (×4): qty 1

## 2023-12-23 MED ORDER — METHYLPREDNISOLONE SODIUM SUCC 40 MG IJ SOLR
40.0000 mg | Freq: Two times a day (BID) | INTRAMUSCULAR | Status: DC
  Administered 2023-12-23 – 2023-12-25 (×4): 40 mg via INTRAVENOUS
  Filled 2023-12-23 (×4): qty 1

## 2023-12-23 MED ORDER — AMLODIPINE BESYLATE 5 MG PO TABS
5.0000 mg | ORAL_TABLET | Freq: Every day | ORAL | Status: DC
  Administered 2023-12-23 – 2023-12-25 (×3): 5 mg via ORAL
  Filled 2023-12-23 (×3): qty 1

## 2023-12-23 MED ORDER — PANTOPRAZOLE SODIUM 40 MG PO TBEC
40.0000 mg | DELAYED_RELEASE_TABLET | Freq: Every evening | ORAL | Status: DC
  Administered 2023-12-23 – 2023-12-24 (×2): 40 mg via ORAL
  Filled 2023-12-23 (×2): qty 1

## 2023-12-23 MED ORDER — CHLORHEXIDINE GLUCONATE CLOTH 2 % EX PADS
6.0000 | MEDICATED_PAD | Freq: Every day | CUTANEOUS | Status: DC
  Administered 2023-12-23 – 2023-12-24 (×2): 6 via TOPICAL

## 2023-12-23 MED ORDER — BUDESONIDE 0.25 MG/2ML IN SUSP
0.2500 mg | Freq: Two times a day (BID) | RESPIRATORY_TRACT | Status: DC
  Administered 2023-12-23 – 2023-12-25 (×5): 0.25 mg via RESPIRATORY_TRACT
  Filled 2023-12-23 (×5): qty 2

## 2023-12-23 MED ORDER — ONDANSETRON HCL 4 MG PO TABS
4.0000 mg | ORAL_TABLET | Freq: Four times a day (QID) | ORAL | Status: DC | PRN
  Administered 2023-12-24: 4 mg via ORAL
  Filled 2023-12-23: qty 1

## 2023-12-23 MED ORDER — METHYLPREDNISOLONE SODIUM SUCC 125 MG IJ SOLR
125.0000 mg | Freq: Once | INTRAMUSCULAR | Status: AC
  Administered 2023-12-23: 125 mg via INTRAVENOUS
  Filled 2023-12-23: qty 2

## 2023-12-23 MED ORDER — LORAZEPAM 2 MG/ML IJ SOLN
1.0000 mg | Freq: Once | INTRAMUSCULAR | Status: AC
Start: 2023-12-23 — End: 2023-12-23
  Administered 2023-12-23: 1 mg via INTRAVENOUS
  Filled 2023-12-23: qty 1

## 2023-12-23 MED ORDER — ACETAMINOPHEN 650 MG RE SUPP
650.0000 mg | Freq: Four times a day (QID) | RECTAL | Status: DC | PRN
Start: 2023-12-23 — End: 2023-12-25

## 2023-12-23 MED ORDER — IPRATROPIUM-ALBUTEROL 0.5-2.5 (3) MG/3ML IN SOLN
3.0000 mL | Freq: Four times a day (QID) | RESPIRATORY_TRACT | Status: DC
  Administered 2023-12-23 – 2023-12-24 (×6): 3 mL via RESPIRATORY_TRACT
  Filled 2023-12-23 (×6): qty 3

## 2023-12-23 MED ORDER — BISACODYL 5 MG PO TBEC
5.0000 mg | DELAYED_RELEASE_TABLET | Freq: Every day | ORAL | Status: DC | PRN
Start: 2023-12-23 — End: 2023-12-25

## 2023-12-23 MED ORDER — ALBUTEROL SULFATE HFA 108 (90 BASE) MCG/ACT IN AERS: 2.0000 | INHALATION_SPRAY | RESPIRATORY_TRACT | Status: DC | PRN

## 2023-12-23 MED ORDER — OXYCODONE HCL 5 MG PO TABS: 5.0000 mg | ORAL_TABLET | ORAL | Status: DC | PRN

## 2023-12-23 MED ORDER — NICOTINE 21 MG/24HR TD PT24: 21.0000 mg | MEDICATED_PATCH | Freq: Every day | TRANSDERMAL | Status: DC | PRN

## 2023-12-23 MED ORDER — CLOPIDOGREL BISULFATE 75 MG PO TABS
75.0000 mg | ORAL_TABLET | Freq: Every day | ORAL | Status: DC
  Administered 2023-12-23 – 2023-12-25 (×3): 75 mg via ORAL
  Filled 2023-12-23 (×3): qty 1

## 2023-12-23 NOTE — Progress Notes (Signed)
   12/23/23 1000  TOC Brief Assessment  Insurance and Status Reviewed  Patient has primary care physician Yes  Home environment has been reviewed from home  Prior level of function: independent  Prior/Current Home Services No current home services  Social Drivers of Health Review SDOH reviewed no interventions necessary  Readmission risk has been reviewed Yes  Transition of care needs no transition of care needs at this time     Transition of Care Department [facility]) has reviewed patient and no TOC needs have been identified at this time. We will continue to monitor patient advancement through interdisciplinary progression rounds. If new patient transition needs arise, please place a TOC consult.

## 2023-12-23 NOTE — Progress Notes (Signed)
RT had entered another patients room and RN told me patient was requesting BIPAP now (RT had just left room). Came back to room and patient was anxious and requesting BIPAP. Placed patient on previous settings from ED.

## 2023-12-23 NOTE — ED Notes (Signed)
Patient noted to have room air oxygen saturations between 89-90%.  Continues to have SHOB.  Placed on 2 L [place].  MD at bedside

## 2023-12-23 NOTE — Progress Notes (Signed)
12/23/2023 1:17 PM  Came back to assess patient.  She is now breathing much more comfortably on bipap. She is mentating well and alert and speaking thru the mask.  Her lung sounds are much improved on bipap.  She says that when the bipap is off for a couple of minutes she rapidly decompensates to acute respiratory distress.  I asked her to try to keep the bipap on for now only coming off for meals and taking medications.  She is still awaiting a bed in the stepdown ICU.    Maryln Manuel, MD  How to contact the [facility] Attending or Consulting provider 7A - 7P or covering provider during after hours 7P -7A, for this patient?  Check the care team in [facility] and look for a) attending/consulting TRH provider listed and b) the [facility] team listed Log into [email or web address] and use [facility]'s universal password to access. If you do not have the password, please contact the hospital operator. Locate the [facility] provider you are looking for under [facility] and page to a number that you can be directly reached. If you still have difficulty reaching the provider, please page the [facility] (Director on Call) for the Hospitalists listed on amion for assistance.

## 2023-12-23 NOTE — ED Triage Notes (Signed)
Pt c/o feeling sob and states she has been using her inhaler and neb treatments with no relief  Pt states she was diagnosed with URI last week

## 2023-12-23 NOTE — H&P (Signed)
History and Physical  [facility]  Diamond Adams BJY:[id number] DOB: 08-20-1961 DOA: 12/23/2023  PCP: Lupita Raider, NP  Patient coming from: Home  Level of care: Med-Surg  I have personally briefly reviewed patient's old medical records in [facility]  Chief Complaint: SOB   HPI: Diamond Adams is a 63 year old female longtime smoker with COPD, anxiety disorder, depression, hypertension, fibromyalgia, recurrent bouts of weakness, reportedly was diagnosed with a URI approximately 1 week ago and treated for a COPD exacerbation in the outpatient setting.  She reports she was given oral steroids bronchodilators and antibiotics.  She reports that she took the medications but her symptoms did not completely improve and have worsened over the past several days.  She is having significant shortness of breath frequent use of her albuterol inhaler and using neb treatments at home with no relief in symptoms.  She reports that she feels like she cannot breathe.  She reports chronic nonproductive cough unable to sleep due to shortness of breath and coughing.  Patient return to see primary care on 12/21/2023 and was given a steroid injection and prescribed oral doxycycline and prednisone and guaifenesin.  She started these medications but presented to the ED due to worsening shortness of breath symptoms.  In the ED she was noted to have negative D-dimer testing and chest x-ray did not show acute infection but she was notably hypoxic with a pulse ox in the low 80 range and placed on supplemental oxygen.  Admission was requested for management of COPD exacerbation that is failed outpatient management.    Past Medical History:  Diagnosis Date   Anxiety disorder    Atherosclerosis    COPD (chronic obstructive pulmonary disease) (HCC)    Depression    Fibromyalgia    GERD (gastroesophageal reflux disease)    Herpes    Hypercholesterolemia    Hypertension    Neuropathy    Optic atrophy    PONV  (postoperative nausea and vomiting)    Prediabetes    Senile purpura (HCC)    Stroke [facility])     Past Surgical History:  Procedure Laterality Date   ABDOMINAL HYSTERECTOMY  1994   AORTIC ARCH ANGIOGRAPHY N/A 05/20/2022   Procedure: AORTIC ARCH ANGIOGRAPHY;  Surgeon: Victorino Sparrow, MD;  Location: [facility] INVASIVE CV LAB;  Service: Cardiovascular;  Laterality: N/A;   CAROTID ANGIOGRAPHY N/A 05/20/2022   Procedure: CAROTID ANGIOGRAPHY;  Surgeon: Victorino Sparrow, MD;  Location: [facility] INVASIVE CV LAB;  Service: Cardiovascular;  Laterality: N/A;   CARPAL TUNNEL RELEASE  2013,2011   right and left   EXCISION NASAL MASS Left 09/25/2013   Procedure: EXCISION NASAL CANCER WITH SPLIT THICKNESS SKIN GRAFT FROM RIGHT THIGH;  Surgeon: Darletta Moll, MD;  Location: [facility];  Service: ENT;  Laterality: Left;   RIGHT OOPHORECTOMY     THUMB ARTHROSCOPY     right   TONSILLECTOMY     TRANSCAROTID ARTERY REVASCULARIZATION  Right 05/28/2022   Procedure: Transcarotid Artery Revascularization;  Surgeon: Victorino Sparrow, MD;  Location: [facility] OR;  Service: Vascular;  Laterality: Right;   TUBAL LIGATION       reports that she quit smoking about 19 months ago. Her smoking use included cigarettes. She has never been exposed to tobacco smoke. She has never used smokeless tobacco. She reports that she does not drink alcohol and does not use drugs.  Allergies  Allergen Reactions   Trazodone And Nefazodone Hives    Family  History  Problem Relation Age of Onset   Emphysema Mother    Obesity Mother    Heart disease Father    High blood pressure Father    Cancer Sister        cervical   Stroke Neg Hx     Prior to Admission medications   Medication Sig Start Date End Date Taking? Authorizing Provider  albuterol (VENTOLIN HFA) 108 (90 Base) MCG/ACT inhaler Inhale into the lungs. 03/22/23   [provider]  ALPRAZolam Prudy Feeler) 0.25 MG tablet Take 1 tablet (0.25 mg total) by mouth at bedtime as  needed for anxiety. 05/26/22   Setzer, Lynnell Jude, PA-C  aspirin EC 325 MG tablet Take 325 mg by mouth daily.    [provider]  benzonatate (TESSALON) 100 MG capsule Take 1 capsule (100 mg total) by mouth 3 (three) times daily as needed for cough. Do not take with alcohol or while operating or driving heavy machinery 7/82/95   Valentino Nose, NP  clopidogrel (PLAVIX) 75 MG tablet Take 75 mg by mouth daily. 09/01/22   [provider]  doxycycline (VIBRAMYCIN) 100 MG capsule Take 1 capsule (100 mg total) by mouth 2 (two) times daily for 7 days. 12/21/23 12/28/23  Valentino Nose, NP  linaclotide Karlene Einstein) 290 MCG CAPS capsule Take 1 capsule (290 mcg total) by mouth daily before breakfast. 11/02/23 11/01/24  Lanelle Bal, DO  lisinopril (ZESTRIL) 40 MG tablet Take 40 mg by mouth daily. 06/22/22   [provider]  pantoprazole (PROTONIX) 40 MG tablet Take 1 tablet (40 mg total) by mouth daily as needed (reflux). 05/26/22   Setzer, Lynnell Jude, PA-C  predniSONE (DELTASONE) 20 MG tablet Take 2 tablets (40 mg total) by mouth daily with breakfast for 5 days. 12/21/23 12/26/23  Valentino Nose, NP  rosuvastatin (CRESTOR) 40 MG tablet Take 1 tablet (40 mg total) by mouth daily. 11/09/22   Victorino Sparrow, MD  senna (SENOKOT) 8.6 MG TABS tablet Take 1 tablet by mouth daily.    [provider]    Physical Exam: Vitals:   12/23/23 6213 12/23/23 0651 12/23/23 0652 12/23/23 0700  BP:    (!) 102/50  Pulse: (!) 101 98 (!) 111 99  Resp:    (!) 24  Temp:      TempSrc:      SpO2: 93% 96% 96% 95%  Weight:      Height:        Constitutional: pt in distress, saying she cannot breathe, gasping for air, uncomfortable Eyes: PERRL, lids and conjunctivae normal ENMT: Mucous membranes are moist. Posterior pharynx clear of any exudate or lesions.  Neck: normal, supple, no masses, no thyromegaly Respiratory: moderate to severe distress, increased work of breathing, poor air  movement with diffuse expiratory wheezing heard.  Cardiovascular: tachycardic rate, normal s1, s2 sounds, no murmurs / rubs / gallops. No extremity edema. 2+ pedal pulses. No carotid bruits.  Abdomen: no tenderness, no masses palpated. No hepatosplenomegaly. Bowel sounds positive.  Musculoskeletal: no clubbing / cyanosis. No joint deformity upper and lower extremities. Good ROM, no contractures. Normal muscle tone.  Skin: no rashes, lesions, ulcers. No induration Neurologic: CN 2-12 grossly intact. Sensation intact, DTR normal. Strength 5/5 in all 4.  Psychiatric: Normal judgment and insight. Alert and oriented x 3. Normal mood.   Labs on Admission: I have personally reviewed following labs and imaging studies  CBC: Recent Labs  Lab 12/23/23 0430  WBC 18.3*  HGB 15.3*  HCT 44.5  MCV 82.0  PLT 358   Basic Metabolic Panel: Recent Labs  Lab 12/20/23 0922 12/23/23 0430  NA 138 135  K 3.6 3.2*  CL 105 104  CO2 25 21*  GLUCOSE 92 128*  BUN 17 14  CREATININE 0.66 0.55  CALCIUM 9.4 9.3   GFR: Estimated Creatinine Clearance: 62.1 mL/min (by C-G formula based on SCr of 0.55 mg/dL). Liver Function Tests: Recent Labs  Lab 12/23/23 0430  AST 16  ALT 30  ALKPHOS 74  BILITOT 0.6  PROT 7.3  ALBUMIN 4.2   No results for input(s): "LIPASE", "AMYLASE" in the last 168 hours. No results for input(s): "AMMONIA" in the last 168 hours. Coagulation Profile: No results for input(s): "INR", "PROTIME" in the last 168 hours. Cardiac Enzymes: No results for input(s): "CKTOTAL", "CKMB", "CKMBINDEX", "TROPONINI" in the last 168 hours. BNP (last 3 results) No results for input(s): "PROBNP" in the last 8760 hours. HbA1C: No results for input(s): "HGBA1C" in the last 72 hours. CBG: No results for input(s): "GLUCAP" in the last 168 hours. Lipid Profile: No results for input(s): "CHOL", "HDL", "LDLCALC", "TRIG", "CHOLHDL", "LDLDIRECT" in the last 72 hours. Thyroid Function Tests: No results  for input(s): "TSH", "T4TOTAL", "FREET4", "T3FREE", "THYROIDAB" in the last 72 hours. Anemia Panel: No results for input(s): "VITAMINB12", "FOLATE", "FERRITIN", "TIBC", "IRON", "RETICCTPCT" in the last 72 hours. Urine analysis:    Component Value Date/Time   COLORURINE YELLOW 03/15/2023 1848   APPEARANCEUR HAZY (A) 03/15/2023 1848   LABSPEC 1.017 03/15/2023 1848   PHURINE 5.0 03/15/2023 1848   GLUCOSEU NEGATIVE 03/15/2023 1848   HGBUR NEGATIVE 03/15/2023 1848   BILIRUBINUR NEGATIVE 03/15/2023 1848   KETONESUR NEGATIVE 03/15/2023 1848   PROTEINUR 30 (A) 03/15/2023 1848   UROBILINOGEN 0.2 07/15/2015 2233   NITRITE NEGATIVE 03/15/2023 1848   LEUKOCYTESUR NEGATIVE 03/15/2023 1848    Radiological Exams on Admission: DG Chest 2 View Result Date: 12/23/2023 CLINICAL DATA:  63 year old female with history of shortness of breath. EXAM: CHEST - 2 VIEW COMPARISON:  Chest x-ray 11/23/2021. FINDINGS: Lung volumes are normal. No consolidative airspace disease. No pleural effusions. No pneumothorax. No pulmonary nodule or mass noted. Pulmonary vasculature and the cardiomediastinal silhouette are within normal limits. Atherosclerosis in the thoracic aorta. IMPRESSION: 1.  No radiographic evidence of acute cardiopulmonary disease. 2. Aortic atherosclerosis. Electronically Signed   By: Trudie Reed M.D.   On: 12/23/2023 05:09    EKG: Independently reviewed. Tachycardic rate on cardiac monitor   Assessment/Plan Principal Problem:   Acute respiratory failure with hypoxia (HCC) Active Problems:   Hypokalemia   HLD (hyperlipidemia)   Tobacco use disorder   Leukocytosis   Essential hypertension   Anxiety state   Carotid artery stenosis, symptomatic, right   Excessive daytime sleepiness   Class 1 obesity with serious comorbidity and body mass index (BMI) of 32.0 to 32.9 in adult   Prediabetes   Palpitations   History of stroke   COPD exacerbation (HCC)   GERD (gastroesophageal reflux  disease)   Acute respiratory  Failure with hypoxia - secondary to COPD exacerbation  - severe respiratory distress prompting call for bipap therapy and VBG - continue bipap for now - admit to stepdown ICU due to requirement for bipap  - supportive measures as ordered   COPD with acute exacerbation  - failed outpatient management  - IV solumedrol as ordered  - IV antibiotics as ordered - check Mg  - scheduled bronchodilators as ordered - bipap as ordered  Hypokalemia - repleted - check magnesium  - follow BMP   GERD - pantoprazole ordered for GI protection   Leukocytosis  - leukemoid reaction from recent steroid usage   Essential Hypertension  - resume home meds  GAD - resume home anxiolytics   Critical Care Procedure Note Authorized and Performed by: Maryln Manuel MD  Total Critical Care time:  68 mins Due to a high probability of clinically significant, life threatening deterioration, the patient required my highest level of preparedness to intervene emergently and I personally spent this critical care time directly and personally managing the patient.  This critical care time included obtaining a history; examining the patient, pulse oximetry; ordering and review of studies; arranging urgent treatment with development of a management plan; evaluation of patient's response of treatment; frequent reassessment; and discussions with other providers.  This critical care time was performed to assess and manage the high probability of imminent and life threatening deterioration that could result in multi-organ failure.  It was exclusive of separately billable procedures and treating other patients and teaching time.      DVT prophylaxis: enoxaparin   Code Status: Full   Family Communication: husband bedside 1/30   Disposition Plan:   Consults called:   Admission status: INP  Level of care: Med-Surg Standley Dakins MD [facility] How to contact the [facility] Attending or  Consulting provider 7A - 7P or covering provider during after hours 7P -7A, for this patient?  Check the care team in [facility] and look for a) attending/consulting [facility] provider listed and b) the [facility] team listed Log into [email or web address] and use [facility] universal password to access. If you do not have the password, please contact the hospital operator. Locate the [facility] provider you are looking for under [facility] and page to a number that you can be directly reached. If you still have difficulty reaching the provider, please page the [facility] (Director on Call) for the Hospitalists listed on amion for assistance.   If 7PM-7AM, please contact night-coverage [email or web address] Password [facility]  12/23/2023, 7:35 AM

## 2023-12-23 NOTE — Hospital Course (Signed)
62 year old female longtime smoker with COPD, anxiety disorder, depression, hypertension, fibromyalgia, recurrent bouts of weakness, reportedly was diagnosed with a URI approximately 1 week ago and treated for a COPD exacerbation in the outpatient setting.  She reports she was given oral steroids bronchodilators and antibiotics.  She reports that she took the medications but her symptoms did not completely improve and have worsened over the past several days.  She is having significant shortness of breath frequent use of her albuterol inhaler and using neb treatments at home with no relief in symptoms.  She reports that she feels like she cannot breathe.  She reports chronic nonproductive cough unable to sleep due to shortness of breath and coughing.  Patient return to see primary care on 12/21/2023 and was given a steroid injection and prescribed oral doxycycline and prednisone and guaifenesin.  She started these medications but presented to the ED due to worsening shortness of breath symptoms.  In the ED she was noted to have negative D-dimer testing and chest x-ray did not show acute infection but she was notably hypoxic with a pulse ox in the low 80 range and placed on supplemental oxygen.  Admission was requested for management of COPD exacerbation that is failed outpatient management.

## 2023-12-23 NOTE — ED Notes (Signed)
Assisted pt. To BSC. Tolerated well. Had very small BM. Brown in color and completed formed.

## 2023-12-23 NOTE — ED Provider Notes (Signed)
[facility]-EMERGENCY DEPT [facility] Emergency Department Provider Note MRN:  [id number]  Arrival date & time: 12/23/23     Chief Complaint   Shortness of Breath   History of Present Illness   Diamond Adams is a 63 y.o. year-old female with a history of COPD presenting to the ED with chief complaint of shortness of breath.  Persistent shortness of breath over the past week or 2.  Was treated for viral illness and COPD exacerbation last week, symptoms quickly came back.  Was treated again for the same diagnosis 2 days ago, symptoms came back, not going away.  Denies fever.  No chest pain.  Review of Systems  A thorough review of systems was obtained and all systems are negative except as noted in the HPI and PMH.   Patient's Health History    Past Medical History:  Diagnosis Date   Anxiety disorder    Atherosclerosis    COPD (chronic obstructive pulmonary disease) (HCC)    Depression    Fibromyalgia    GERD (gastroesophageal reflux disease)    Herpes    Hypercholesterolemia    Hypertension    Neuropathy    Optic atrophy    PONV (postoperative nausea and vomiting)    Prediabetes    Senile purpura (HCC)    Stroke [facility])     Past Surgical History:  Procedure Laterality Date   ABDOMINAL HYSTERECTOMY  1994   AORTIC ARCH ANGIOGRAPHY N/A 05/20/2022   Procedure: AORTIC ARCH ANGIOGRAPHY;  Surgeon: Victorino Sparrow, MD;  Location: [facility] INVAS[facility] CV LAB;  Service: Cardiovascular;  Laterality: N/A;   CAROTID ANGIOGRAPHY N/A 05/20/2022   Procedure: CAROTID ANGIOGRAPHY;  Surgeon: Victorino Sparrow, MD;  Location: [facility] INVASIVE CV LAB;  Service: Cardiovascular;  Laterality: N/A;   CARPAL TUNNEL RELEASE  2013,2011   right and left   EXCISION NASAL MASS Left 09/25/2013   Procedure: EXCISION NASAL CANCER WITH SPLIT THICKNESS SKIN GRAFT FROM RIGHT THIGH;  Surgeon: Darletta Moll, MD;  Location: [facility];  Service: ENT;  Laterality: Left;   RIGHT OOPHORECTOMY     THUMB ARTHROSCOPY      right   TONSILLECTOMY     TRANSCAROTID ARTERY REVASCULARIZATION  Right 05/28/2022   Procedure: Transcarotid Artery Revascularization;  Surgeon: Victorino Sparrow, MD;  Location[facility] OR;  Service: Vascular;  Laterality: Right;   TUBAL LIGATION      Family History  Problem Relation Age of Onset   Emphysema Mother    Obesity Mother    Heart disease Father    High blood pressure Father    Cancer Sister        cervical   Stroke Neg Hx     Social History   Socioeconomic History   Marital status: Divorced    Spouse name: Not on file   Number of children: Not on file   Years of education: Not on file   Highest education level: Not on file  Occupational History   Occupation: Disabled  Tobacco Use   Smoking status: Former    Current packs/day: 0.00    Types: Cigarettes    Quit date: 05/17/2022    Years since quitting: 1.6    Passive exposure: Never   Smokeless tobacco: Never   Tobacco comments:    Last time smoking was 05/20/22.  Vaping Use   Vaping status: Never Used  Substance and Sexual Activity   Alcohol use: No   Drug use: No   Sexual activity: Yes  Birth control/protection: Surgical  Other Topics Concern   Not on file  Social History Narrative   Not on file   Social Drivers of Health   Financial Resource Strain: Not on file  Food Insecurity: Not on file  Transportation Needs: Not on file  Physical Activity: Not on file  Stress: Not on file  Social Connections: Not on file  Intimate Partner Violence: Not on file     Physical Exam   Vitals:   12/23/23 0651 12/23/23 0652  BP:    Pulse: 98 (!) 111  Resp:    Temp:    SpO2: 96% 96%    CONSTITUTIONAL: Chronically ill-appearing, NAD NEURO/PSYCH:  Alert and oriented x 3, no focal deficits EYES:  eyes equal and reactive ENT/NECK:  no LAD, no JVD CARDIO: Regular rate, well-perfused, normal S1 and S2 PULM:  CTAB no wheezing or rhonchi GI/GU:  non-distended, non-tender MSK/SPINE:  No gross deformities, no  edema SKIN:  no rash, atraumatic   *Additional and/or pertinent findings included in MDM below  Diagnostic and Interventional Summary    EKG Interpretation Date/Time:  Thursday December 23 2023 03:33:31 EST Ventricular Rate:  103 PR Interval:  150 QRS Duration:  76 QT Interval:  346 QTC Calculation: 453 R Axis:   58  Text Interpretation: Sinus tachycardia Nonspecific ST abnormality Abnormal ECG When compared with ECG of 16-Mar-2023 00:10, PREVIOUS ECG IS PRESENT Confirmed by Kennis Carina [phone]) on 12/23/2023 4:30:25 AM       Labs Reviewed  CBC - Abnormal; Notable for the following components:      Result Value   WBC 18.3 (*)    RBC 5.43 (*)    Hemoglobin 15.3 (*)    All other components within normal limits  COMPREHENSIVE METABOLIC PANEL - Abnormal; Notable for the following components:   Potassium 3.2 (*)    CO2 21 (*)    Glucose, Bld 128 (*)    All other components within normal limits  RESP PANEL BY RT-PCR (RSV, FLU A&B, COVID)  RVPGX2  BRAIN NATRIURETIC PEPTIDE  D-DIMER, QUANTITATIVE  TROPONIN I (HIGH SENSITIVITY)  TROPONIN I (HIGH SENSITIVITY)    DG Chest 2 View  Final Result      Medications  albuterol (VENTOLIN HFA) 108 (90 Base) MCG/ACT inhaler 2 puff (has no administration in time range)  methylPREDNISolone sodium succinate (SOLU-MEDROL) 125 mg/2 mL injection 125 mg (has no administration in time range)  ipratropium-albuterol (DUONEB) 0.5-2.5 (3) MG/3ML nebulizer solution 3 mL (3 mLs Nebulization Given 12/23/23 0427)  LORazepam (ATIVAN) injection 1 mg (1 mg Intravenous Given 12/23/23 0554)     Procedures  /  Critical Care Procedures  ED Course and Medical Decision Making  Initial Impression and Ddx Persistent shortness of breath despite appropriate treatment for COPD exacerbation brings into question an accurate diagnosis.  Patient does have wheezing on exam.  Will obtain screening labs with troponin, BNP, D-dimer to evaluate for alternate diagnosis such  as ACS, CHF, PE.  Past medical/surgical history that increases complexity of ED encounter: COPD  Interpretation of Diagnostics I personally reviewed the EKG and my interpretation is as follows: Sinus tachycardia  Labs reassuring without significant blood count or electrolyte disturbance  Patient Reassessment and Ultimate Disposition/Management     On reassessment patient continues to be short of breath and now has desaturation into the upper 80s on room air, requiring 2 L nasal cannula.  Workup without any other obvious cause of patient's symptoms and exam is consistent with COPD exacerbation.  Will admit for failed outpatient management.  Patient management required discussion with the following services or consulting groups:  Hospitalist Service  Complexity of Problems Addressed Acute illness or injury that poses threat of life of bodily function  Additional Data Reviewed and Analyzed Further history obtained from: Further history from spouse/family member  Additional Factors Impacting ED Encounter Risk Consideration of hospitalization  Elmer Sow. Pilar Plate, MD [facility] Emergency Medicine [facility] [email or web address]  Final Clinical Impressions(s) / ED Diagnoses     ICD-10-CM   1. COPD exacerbation (HCC)  J44.1       ED Discharge Orders     None        Discharge Instructions Discussed with and Provided to Patient:   Discharge Instructions   None      Sabas Sous, MD 12/23/23 [phone]

## 2023-12-23 NOTE — ED Notes (Signed)
Patient c/o increasing SHOB.  States "it is getting harder to breath."  Appears anxious.  MD made aware

## 2023-12-23 NOTE — Progress Notes (Signed)
BIPAP at bedside if needed. Patient does not wear CPAP or BIPAP at home at night. VBG results were good and patient reports breathing is better. She does not wish to wear BIPAP unless needed. Will monitor through the night, but holding BIPAP unless patient is in distress.

## 2023-12-23 NOTE — ED Notes (Signed)
Pt taken off of BiPAP to give PO meds. She becomes noticeably short of breath within 2 minutes. O2 saturation drops from 99% to 94% while on room air. Pt able to take PO meds and drink water.

## 2023-12-23 NOTE — Progress Notes (Signed)
Chaplain went to visit Pt in ED. Significant other at bedside. Pt shared feeling better, she had no main concerns; however feeling very tired from being up since 3AM today. Pt is very grateful to have her partner being with her all the time. Pt requested prayers for strength. Chaplain listened and allowed Pt and partner to share their feelings. Pt, partner and Chaplain prayed together and let them know this office is available for any future need to talk.  Diamond Adams Chaplain Resident

## 2023-12-24 DIAGNOSIS — J9601 Acute respiratory failure with hypoxia: Secondary | ICD-10-CM | POA: Diagnosis not present

## 2023-12-24 DIAGNOSIS — E876 Hypokalemia: Secondary | ICD-10-CM | POA: Diagnosis not present

## 2023-12-24 DIAGNOSIS — D72823 Leukemoid reaction: Secondary | ICD-10-CM | POA: Diagnosis not present

## 2023-12-24 DIAGNOSIS — I10 Essential (primary) hypertension: Secondary | ICD-10-CM | POA: Diagnosis not present

## 2023-12-24 LAB — BASIC METABOLIC PANEL
Anion gap: 9 (ref 5–15)
BUN: 27 mg/dL — ABNORMAL HIGH (ref 8–23)
CO2: 24 mmol/L (ref 22–32)
Calcium: 9.7 mg/dL (ref 8.9–10.3)
Chloride: 105 mmol/L (ref 98–111)
Creatinine, Ser: 0.82 mg/dL (ref 0.44–1.00)
GFR, Estimated: >60 mL/min (ref >60)
Glucose, Bld: 142 mg/dL — ABNORMAL HIGH (ref 70–99)
Potassium: 3.5 mmol/L (ref 3.5–5.1)
Sodium: 138 mmol/L (ref 135–145)

## 2023-12-24 LAB — HIV ANTIBODY (ROUTINE TESTING W REFLEX): HIV Screen 4th Generation wRfx: NONREACTIVE

## 2023-12-24 MED ORDER — POTASSIUM CHLORIDE CRYS ER 20 MEQ PO TBCR
40.0000 meq | EXTENDED_RELEASE_TABLET | Freq: Once | ORAL | Status: AC
  Administered 2023-12-24: 40 meq via ORAL
  Filled 2023-12-24: qty 2

## 2023-12-24 MED ORDER — IPRATROPIUM BROMIDE 0.02 % IN SOLN
0.5000 mg | Freq: Four times a day (QID) | RESPIRATORY_TRACT | Status: DC
  Administered 2023-12-25 (×2): 0.5 mg via RESPIRATORY_TRACT
  Filled 2023-12-24 (×2): qty 2.5

## 2023-12-24 MED ORDER — LEVALBUTEROL HCL 0.63 MG/3ML IN NEBU
0.6300 mg | INHALATION_SOLUTION | Freq: Four times a day (QID) | RESPIRATORY_TRACT | Status: DC
  Administered 2023-12-25 (×2): 0.63 mg via RESPIRATORY_TRACT
  Filled 2023-12-24 (×2): qty 3

## 2023-12-24 NOTE — Progress Notes (Signed)
Received report from [employer]. This Investment banker, operational will be taking over care.

## 2023-12-24 NOTE — Care Management Important Message (Signed)
Important Message  Patient Details  Name: Diamond Adams MRN: [id number] Date of Birth: Dec 12, 1960   Important Message Given:  Yes - Medicare IM     Corey Harold 12/24/2023, 2:03 PM

## 2023-12-24 NOTE — Plan of Care (Signed)

## 2023-12-24 NOTE — Progress Notes (Signed)
PROGRESS NOTE   Diamond Adams  ZOX:[id number] DOB: 1960/12/13 DOA: 12/23/2023 PCP: Lupita Raider, NP   Chief Complaint  Patient presents with   Shortness of Breath   Level of care: Med-Surg  Brief Admission History:  63 year old female longtime smoker with COPD, anxiety disorder, depression, hypertension, fibromyalgia, recurrent bouts of weakness, reportedly was diagnosed with a URI approximately 1 week ago and treated for a COPD exacerbation in the outpatient setting.  She reports she was given oral steroids bronchodilators and antibiotics.  She reports that she took the medications but her symptoms did not completely improve and have worsened over the past several days.  She is having significant shortness of breath frequent use of her albuterol inhaler and using neb treatments at home with no relief in symptoms.  She reports that she feels like she cannot breathe.  She reports chronic nonproductive cough unable to sleep due to shortness of breath and coughing.  Patient return to see primary care on 12/21/2023 and was given a steroid injection and prescribed oral doxycycline and prednisone and guaifenesin.  She started these medications but presented to the ED due to worsening shortness of breath symptoms.  In the ED she was noted to have negative D-dimer testing and chest x-ray did not show acute infection but she was notably hypoxic with a pulse ox in the low 80 range and placed on supplemental oxygen.  Admission was requested for management of COPD exacerbation that is failed outpatient management.   Assessment and Plan:  Acute respiratory  Failure with hypoxia - secondary to COPD exacerbation  - severe respiratory distress prompting call for bipap therapy and VBG - continue bipap for now - admitted to stepdown ICU due to requirement for bipap  - supportive measures as ordered  - continue nightly bipap but can move out of ICU to floor today   COPD with acute exacerbation  - failed  outpatient management  - IV solumedrol as ordered  - IV antibiotics as ordered - check Mg -- 2.0 - scheduled bronchodilators as ordered - nightly bipap as ordered    Hypokalemia - Repleted  - repleted orally - check magnesium    GERD - pantoprazole ordered for GI protection    Leukocytosis  - leukemoid reaction from recent steroid usage    Essential Hypertension  - resumed home meds   GAD - resumed home anxiolytics   DVT prophylaxis: enoxaparin  Code Status: Full  Family Communication: husband bedside 1/30 Disposition: anticipate home tomorrow    Consultants:   Procedures:   Antimicrobials:  AZM 1/30>> CTX 1/30>>   Subjective: Less shortness of breath and reports she had to go back on bipap last night.    Objective: Vitals:   12/24/23 1000 12/24/23 1100 12/24/23 1142 12/24/23 1200  BP:  112/61    Pulse:  (!) 104    Resp: 14 20  (!) 26  Temp:   97.8 F (36.6 C)   TempSrc:   Oral   SpO2:  (!) 85%    Weight:      Height:        Intake/Output Summary (Last 24 hours) at 12/24/2023 1317 Last data filed at 12/23/2023 1820 Gross per 24 hour  Intake 200 ml  Output --  Net 200 ml   Filed Weights   12/23/23 0331 12/23/23 1814  Weight: 72 kg 70 kg   Examination:  General exam: Appears calm and comfortable  Respiratory system: mild increased work of breathing.  Cardiovascular system: normal  S1 & S2 heard. No JVD, murmurs, rubs, gallops or clicks. No pedal edema. Gastrointestinal system: Abdomen is nondistended, soft and nontender. No organomegaly or masses felt. Normal bowel sounds heard. Central nervous system: Alert and oriented. No focal neurological deficits. Extremities: Symmetric 5 x 5 power. Skin: No rashes, lesions or ulcers. Psychiatry: Judgement and insight appear normal. Mood & affect appropriate.   Data Reviewed: I have personally reviewed following labs and imaging studies  CBC: Recent Labs  Lab 12/23/23 0430  WBC 18.3*  HGB 15.3*  HCT  44.5  MCV 82.0  PLT 358    Basic Metabolic Panel: Recent Labs  Lab 12/20/23 0922 12/23/23 0430 12/23/23 0615 12/24/23 0552  NA 138 135  --  138  K 3.6 3.2*  --  3.5  CL 105 104  --  105  CO2 25 21*  --  24  GLUCOSE 92 128*  --  142*  BUN 17 14  --  27*  CREATININE 0.66 0.55  --  0.82  CALCIUM 9.4 9.3  --  9.7  MG  --   --  2.0  --     CBG: No results for input(s): "GLUCAP" in the last 168 hours.  Recent Results (from the past 240 hours)  Resp panel by RT-PCR (RSV, Flu A&B, Covid) Anterior Nasal Swab     Status: None   Collection Time: 12/23/23  4:30 AM   Specimen: Anterior Nasal Swab  Result Value Ref Range Status   SARS Coronavirus 2 by RT PCR NEGATIVE NEGATIVE Final    Comment: (NOTE) SARS-CoV-2 target nucleic acids are NOT DETECTED.  The SARS-CoV-2 RNA is generally detectable in upper respiratory specimens during the acute phase of infection. The lowest concentration of SARS-CoV-2 viral copies this assay can detect is 138 copies/mL. A negative result does not preclude SARS-Cov-2 infection and should not be used as the sole basis for treatment or other patient management decisions. A negative result may occur with  improper specimen collection/handling, submission of specimen other than nasopharyngeal swab, presence of viral mutation(s) within the areas targeted by this assay, and inadequate number of viral copies(<138 copies/mL). A negative result must be combined with clinical observations, patient history, and epidemiological information. The expected result is Negative.  Fact Sheet for Patients:  BloggerCourse.com  Fact Sheet for Healthcare Providers:  SeriousBroker.it  This test is no t yet approved or cleared by the [place] FDA and  has been authorized for detection and/or diagnosis of SARS-CoV-2 by FDA under an Emergency Use Authorization (EUA). This EUA will remain  in effect (meaning this test  can be used) for the duration of the COVID-19 declaration under Section 564(b)(1) of the Act, 21 U.S.C.section 360bbb-3(b)(1), unless the authorization is terminated  or revoked sooner.       Influenza A by PCR NEGATIVE NEGATIVE Final   Influenza B by PCR NEGATIVE NEGATIVE Final    Comment: (NOTE) The Xpert Xpress SARS-CoV-2/FLU/RSV plus assay is intended as an aid in the diagnosis of influenza from Nasopharyngeal swab specimens and should not be used as a sole basis for treatment. Nasal washings and aspirates are unacceptable for Xpert Xpress SARS-CoV-2/FLU/RSV testing.  Fact Sheet for Patients: BloggerCourse.com  Fact Sheet for Healthcare Providers: SeriousBroker.it  This test is not yet approved or cleared by the [place] FDA and has been authorized for detection and/or diagnosis of SARS-CoV-2 by FDA under an Emergency Use Authorization (EUA). This EUA will remain in effect (meaning this test can be used)  for the duration of the COVID-19 declaration under Section 564(b)(1) of the Act, 21 U.S.C. section 360bbb-3(b)(1), unless the authorization is terminated or revoked.     Resp Syncytial Virus by PCR NEGATIVE NEGATIVE Final    Comment: (NOTE) Fact Sheet for Patients: BloggerCourse.com  Fact Sheet for Healthcare Providers: SeriousBroker.it  This test is not yet approved or cleared by the [place] FDA and has been authorized for detection and/or diagnosis of SARS-CoV-2 by FDA under an Emergency Use Authorization (EUA). This EUA will remain in effect (meaning this test can be used) for the duration of the COVID-19 declaration under Section 564(b)(1) of the Act, 21 U.S.C. section 360bbb-3(b)(1), unless the authorization is terminated or revoked.  Performed at [facility], [address] [place], [place] [address]   MRSA Next Gen by PCR, Nasal     Status: None    Collection Time: 12/23/23  6:07 PM   Specimen: Nasal Mucosa; Nasal Swab  Result Value Ref Range Status   MRSA by PCR Next Gen NOT DETECTED NOT DETECTED Final    Comment: (NOTE) The GeneXpert MRSA Assay (FDA approved for NASAL specimens only), is one component of a comprehensive MRSA colonization surveillance program. It is not intended to diagnose MRSA infection nor to guide or monitor treatment for MRSA infections. Test performance is not FDA approved in patients less than 54 years old. Performed at [facility], [address] [place], [place] [address]      Radiology Studies: DG Chest 2 View Result Date: 12/23/2023 CLINICAL DATA:  63 year old female with history of shortness of breath. EXAM: CHEST - 2 VIEW COMPARISON:  Chest x-ray 11/23/2021. FINDINGS: Lung volumes are normal. No consolidative airspace disease. No pleural effusions. No pneumothorax. No pulmonary nodule or mass noted. Pulmonary vasculature and the cardiomediastinal silhouette are within normal limits. Atherosclerosis in the thoracic aorta. IMPRESSION: 1.  No radiographic evidence of acute cardiopulmonary disease. 2. Aortic atherosclerosis. Electronically Signed   By: Trudie Reed M.D.   On: 12/23/2023 05:09    Scheduled Meds:  amLODipine  5 mg Oral Daily   aspirin EC  325 mg Oral Daily   budesonide (PULMICORT) nebulizer solution  0.25 mg Nebulization BID   Chlorhexidine Gluconate Cloth  6 each Topical Daily   clopidogrel  75 mg Oral Daily   enoxaparin (LOVENOX) injection  40 mg Subcutaneous Q24H   guaiFENesin  1,200 mg Oral BID   ipratropium-albuterol  3 mL Nebulization Q6H   linaclotide  290 mcg Oral QAC breakfast   lisinopril  40 mg Oral Daily   methylPREDNISolone (SOLU-MEDROL) injection  40 mg Intravenous Q12H   pantoprazole  40 mg Oral QPM   rosuvastatin  40 mg Oral QPM   saccharomyces boulardii  250 mg Oral BID   Continuous Infusions:  azithromycin 500 mg (12/24/23 1120)   cefTRIAXone (ROCEPHIN)  IV 1  g (12/24/23 1015)     LOS: 1 day   Time spent: 55 mins  Leeanna Slaby Laural Benes, MD How to contact the [facility] Attending or Consulting provider 7A - 7P or covering provider during after hours 7P -7A, for this patient?  Check the care team in [facility] and look for a) attending/consulting [facility] provider listed and b) the [facility] team listed Log into [email or web address] to find provider on call.  Locate the [facility] provider you are looking for under [facility] and page to a number that you can be directly reached. If you still have difficulty reaching the provider, please page the [facility] (Director on Call)  for the Hospitalists listed on amion for assistance.  12/24/2023, 1:17 PM

## 2023-12-25 DIAGNOSIS — K2100 Gastro-esophageal reflux disease with esophagitis, without bleeding: Secondary | ICD-10-CM

## 2023-12-25 DIAGNOSIS — E876 Hypokalemia: Secondary | ICD-10-CM | POA: Diagnosis not present

## 2023-12-25 DIAGNOSIS — J9601 Acute respiratory failure with hypoxia: Secondary | ICD-10-CM | POA: Diagnosis not present

## 2023-12-25 DIAGNOSIS — F17200 Nicotine dependence, unspecified, uncomplicated: Secondary | ICD-10-CM | POA: Diagnosis not present

## 2023-12-25 MED ORDER — IPRATROPIUM-ALBUTEROL 0.5-2.5 (3) MG/3ML IN SOLN: 3.0000 mL | RESPIRATORY_TRACT | 1 refills | Status: AC | PRN

## 2023-12-25 MED ORDER — ALBUTEROL SULFATE HFA 108 (90 BASE) MCG/ACT IN AERS: 1.0000 | INHALATION_SPRAY | RESPIRATORY_TRACT | 3 refills | Status: AC | PRN

## 2023-12-25 MED ORDER — PANTOPRAZOLE SODIUM 40 MG PO TBEC: 40.0000 mg | DELAYED_RELEASE_TABLET | Freq: Every day | ORAL | 0 refills | Status: AC

## 2023-12-25 NOTE — Plan of Care (Signed)

## 2023-12-25 NOTE — Plan of Care (Signed)
Patient had a panic attack after 2000 neb treatment. Patient did eventually calm down after xanax given and staff staying with patient and calming her down. Pt then rested during the night.  Problem: Education: Goal: Knowledge of General Education information will improve Description: Including pain rating scale, medication(s)/side effects and non-pharmacologic comfort measures Outcome: Progressing   Problem: Health Behavior/Discharge Planning: Goal: Ability to manage health-related needs will improve Outcome: Progressing   Problem: Clinical Measurements: Goal: Ability to maintain clinical measurements within normal limits will improve Outcome: Progressing Goal: Will remain free from infection Outcome: Progressing Goal: Diagnostic test results will improve Outcome: Progressing Goal: Respiratory complications will improve Outcome: Progressing Goal: Cardiovascular complication will be avoided Outcome: Progressing   Problem: Activity: Goal: Risk for activity intolerance will decrease Outcome: Progressing   Problem: Nutrition: Goal: Adequate nutrition will be maintained Outcome: Progressing   Problem: Coping: Goal: Level of anxiety will decrease Outcome: Progressing   Problem: Elimination: Goal: Will not experience complications related to bowel motility Outcome: Progressing Goal: Will not experience complications related to urinary retention Outcome: Progressing   Problem: Pain Managment: Goal: General experience of comfort will improve and/or be controlled Outcome: Progressing   Problem: Safety: Goal: Ability to remain free from injury will improve Outcome: Progressing   Problem: Skin Integrity: Goal: Risk for impaired skin integrity will decrease Outcome: Progressing

## 2023-12-25 NOTE — Discharge Summary (Signed)
Physician Discharge Summary  Diamond Adams:[id number] DOB: 1961/02/17 DOA: 12/23/2023  PCP: Lupita Raider, NP  Admit date: 12/23/2023 Discharge date: 12/25/2023  Admitted From:  HOME  Disposition:  HOME   Recommendations for Outpatient Follow-up:  Follow up with PCP in 1 weeks Please continue smoking cessation counseling   Discharge Condition: STABLE   CODE STATUS: FULL DIET: resume home diet    Brief Hospitalization Summary: Please see all hospital notes, images, labs for full details of the hospitalization. Admission provider HPI: 63 year old female longtime smoker with COPD, anxiety disorder, depression, hypertension, fibromyalgia, recurrent bouts of weakness, reportedly was diagnosed with a URI approximately 1 week ago and treated for a COPD exacerbation in the outpatient setting.  She reports she was given oral steroids bronchodilators and antibiotics.  She reports that she took the medications but her symptoms did not completely improve and have worsened over the past several days.  She is having significant shortness of breath frequent use of her albuterol inhaler and using neb treatments at home with no relief in symptoms.  She reports that she feels like she cannot breathe.  She reports chronic nonproductive cough unable to sleep due to shortness of breath and coughing.  Patient return to see primary care on 12/21/2023 and was given a steroid injection and prescribed oral doxycycline and prednisone and guaifenesin.  She started these medications but presented to the ED due to worsening shortness of breath symptoms.  In the ED she was noted to have negative D-dimer testing and chest x-ray did not show acute infection but she was notably hypoxic with a pulse ox in the low 80 range and placed on supplemental oxygen.  Admission was requested for management of COPD exacerbation that is failed outpatient management.  Hospital Course by problem list   Acute respiratory  Failure with hypoxia  - resolved after treatments  - secondary to COPD exacerbation  - severe respiratory distress prompting call for bipap therapy and VBG - Patient was briefly treated with BiPAP therapy and now weaned to room air oxygen - initially was admitted to stepdown ICU due to requirement for bipap  - supportive measures as ordered    COPD with acute exacerbation  - failed outpatient management  - IV solumedrol ordered in hospital   - IV antibiotics as ordered - check Mg -- 2.0 - scheduled bronchodilators as ordered - DC  home today on oral prednisone, doxycycline, duonebs    Hypokalemia - Repleted  - repleted orally   GERD - pantoprazole ordered for GI protection    Leukocytosis  - leukemoid reaction from recent steroid usage    Essential Hypertension  - resumed home meds   GAD with panic attacks - resumed home anxiolytics   Discharge Diagnoses:  Principal Problem:   Acute respiratory failure with hypoxia (HCC) Active Problems:   Hypokalemia   HLD (hyperlipidemia)   Tobacco use disorder   Leukocytosis   Essential hypertension   Anxiety state   Carotid artery stenosis, symptomatic, right   Excessive daytime sleepiness   Class 1 obesity with serious comorbidity and body mass index (BMI) of 32.0 to 32.9 in adult   Prediabetes   Palpitations   History of stroke   COPD exacerbation (HCC)   GERD (gastroesophageal reflux disease)   Acute respiratory failure with hypoxemia [facility])   Discharge Instructions:  Allergies as of 12/25/2023       Reactions   Trazodone And Nefazodone Hives        Medication List  STOP taking these medications    azithromycin 250 MG tablet Commonly known as: ZITHROMAX       TAKE these medications    albuterol 108 (90 Base) MCG/ACT inhaler Commonly known as: VENTOLIN HFA Inhale 1-2 puffs into the lungs every 4 (four) hours as needed for wheezing or shortness of breath. What changed:  how much to take when to take this reasons to take  this   ALPRAZolam 0.25 MG tablet Commonly known as: XANAX Take 1 tablet (0.25 mg total) by mouth at bedtime as needed for anxiety.   amLODipine 5 MG tablet Commonly known as: NORVASC Take 5 mg by mouth daily.   aspirin EC 325 MG tablet Take 325 mg by mouth daily.   benzonatate 100 MG capsule Commonly known as: TESSALON Take 1 capsule (100 mg total) by mouth 3 (three) times daily as needed for cough. Do not take with alcohol or while operating or driving heavy machinery   clopidogrel 75 MG tablet Commonly known as: PLAVIX Take 75 mg by mouth daily.   doxycycline 100 MG capsule Commonly known as: VIBRAMYCIN Take 1 capsule (100 mg total) by mouth 2 (two) times daily for 7 days.   FLUoxetine 10 MG capsule Commonly known as: PROZAC Take 10 mg by mouth daily.   hydrocortisone 25 MG suppository Commonly known as: ANUSOL-HC Place 25 mg rectally 2 (two) times daily as needed.   ipratropium-albuterol 0.5-2.5 (3) MG/3ML Soln Commonly known as: DUONEB Take 3 mLs by nebulization every 4 (four) hours as needed (wheezing, coughing, shortness of breath). What changed: See the new instructions.   linaclotide 290 MCG Caps capsule Commonly known as: Linzess Take 1 capsule (290 mcg total) by mouth daily before breakfast.   lisinopril 40 MG tablet Commonly known as: ZESTRIL Take 40 mg by mouth daily.   pantoprazole 40 MG tablet Commonly known as: PROTONIX Take 1 tablet (40 mg total) by mouth daily. What changed:  when to take this reasons to take this   predniSONE 20 MG tablet Commonly known as: DELTASONE Take 2 tablets (40 mg total) by mouth daily with breakfast for 5 days.   rosuvastatin 40 MG tablet Commonly known as: Crestor Take 1 tablet (40 mg total) by mouth daily.   senna 8.6 MG Tabs tablet Commonly known as: SENOKOT Take 1 tablet by mouth daily.   Trelegy Ellipta 100-62.5-25 MCG/ACT Aepb Generic drug: Fluticasone-Umeclidin-Vilant Inhale 1 puff into the lungs  daily.   valACYclovir 500 MG tablet Commonly known as: VALTREX Take 500 mg by mouth daily.        Follow-up Information     Lupita Raider, NP. Schedule an appointment as soon as possible for a visit in 1 week(s).   Specialty: Family Medicine Why: Hospital Follow Up Contact information: [address] [place] [address] [phone]                Allergies  Allergen Reactions   Trazodone And Nefazodone Hives   Allergies as of 12/25/2023       Reactions   Trazodone And Nefazodone Hives        Medication List     STOP taking these medications    azithromycin 250 MG tablet Commonly known as: ZITHROMAX       TAKE these medications    albuterol 108 (90 Base) MCG/ACT inhaler Commonly known as: VENTOLIN HFA Inhale 1-2 puffs into the lungs every 4 (four) hours as needed for wheezing or shortness of breath. What changed:  how much to take when to take this reasons to take this   ALPRAZolam 0.25 MG tablet Commonly known as: XANAX Take 1 tablet (0.25 mg total) by mouth at bedtime as needed for anxiety.   amLODipine 5 MG tablet Commonly known as: NORVASC Take 5 mg by mouth daily.   aspirin EC 325 MG tablet Take 325 mg by mouth daily.   benzonatate 100 MG capsule Commonly known as: TESSALON Take 1 capsule (100 mg total) by mouth 3 (three) times daily as needed for cough. Do not take with alcohol or while operating or driving heavy machinery   clopidogrel 75 MG tablet Commonly known as: PLAVIX Take 75 mg by mouth daily.   doxycycline 100 MG capsule Commonly known as: VIBRAMYCIN Take 1 capsule (100 mg total) by mouth 2 (two) times daily for 7 days.   FLUoxetine 10 MG capsule Commonly known as: PROZAC Take 10 mg by mouth daily.   hydrocortisone 25 MG suppository Commonly known as: ANUSOL-HC Place 25 mg rectally 2 (two) times daily as needed.   ipratropium-albuterol 0.5-2.5 (3) MG/3ML Soln Commonly known as: DUONEB Take 3 mLs by  nebulization every 4 (four) hours as needed (wheezing, coughing, shortness of breath). What changed: See the new instructions.   linaclotide 290 MCG Caps capsule Commonly known as: Linzess Take 1 capsule (290 mcg total) by mouth daily before breakfast.   lisinopril 40 MG tablet Commonly known as: ZESTRIL Take 40 mg by mouth daily.   pantoprazole 40 MG tablet Commonly known as: PROTONIX Take 1 tablet (40 mg total) by mouth daily. What changed:  when to take this reasons to take this   predniSONE 20 MG tablet Commonly known as: DELTASONE Take 2 tablets (40 mg total) by mouth daily with breakfast for 5 days.   rosuvastatin 40 MG tablet Commonly known as: Crestor Take 1 tablet (40 mg total) by mouth daily.   senna 8.6 MG Tabs tablet Commonly known as: SENOKOT Take 1 tablet by mouth daily.   Trelegy Ellipta 100-62.5-25 MCG/ACT Aepb Generic drug: Fluticasone-Umeclidin-Vilant Inhale 1 puff into the lungs daily.   valACYclovir 500 MG tablet Commonly known as: VALTREX Take 500 mg by mouth daily.       Procedures/Studies: DG Chest 2 View Result Date: 12/23/2023 CLINICAL DATA:  63 year old female with history of shortness of breath. EXAM: CHEST - 2 VIEW COMPARISON:  Chest x-ray 11/23/2021. FINDINGS: Lung volumes are normal. No consolidative airspace disease. No pleural effusions. No pneumothorax. No pulmonary nodule or mass noted. Pulmonary vasculature and the cardiomediastinal silhouette are within normal limits. Atherosclerosis in the thoracic aorta. IMPRESSION: 1.  No radiographic evidence of acute cardiopulmonary disease. 2. Aortic atherosclerosis. Electronically Signed   By: Trudie Reed M.D.   On: 12/23/2023 05:09     Subjective: Pt reports breathing much better today.  She has no SOB and nebs are helping.  No CP.    Discharge Exam: Vitals:   12/25/23 0423 12/25/23 0839  BP: 129/72   Pulse: 65   Resp: (!) 21   Temp: 98 F (36.7 C)   SpO2: 93% 94%   Vitals:    12/24/23 2156 12/25/23 0335 12/25/23 0423 12/25/23 0839  BP:   129/72   Pulse: 86  65   Resp:   (!) 21   Temp:   98 F (36.7 C)   TempSrc:   Oral   SpO2:  93% 93% 94%  Weight:      Height:       General:  Pt is alert, awake, not in acute distress Cardiovascular: normal S1/S2 +, no rubs, no gallops Respiratory: CTA bilaterally, no wheezing, no rhonchi Abdominal: Soft, NT, ND, bowel sounds + Extremities: no edema, no cyanosis   The results of significant diagnostics from this hospitalization (including imaging, microbiology, ancillary and laboratory) are listed below for reference.     Microbiology: Recent Results (from the past 240 hours)  Resp panel by RT-PCR (RSV, Flu A&B, Covid) Anterior Nasal Swab     Status: None   Collection Time: 12/23/23  4:30 AM   Specimen: Anterior Nasal Swab  Result Value Ref Range Status   SARS Coronavirus 2 by RT PCR NEGATIVE NEGATIVE Final    Comment: (NOTE) SARS-CoV-2 target nucleic acids are NOT DETECTED.  The SARS-CoV-2 RNA is generally detectable in upper respiratory specimens during the acute phase of infection. The lowest concentration of SARS-CoV-2 viral copies this assay can detect is 138 copies/mL. A negative result does not preclude SARS-Cov-2 infection and should not be used as the sole basis for treatment or other patient management decisions. A negative result may occur with  improper specimen collection/handling, submission of specimen other than nasopharyngeal swab, presence of viral mutation(s) within the areas targeted by this assay, and inadequate number of viral copies(<138 copies/mL). A negative result must be combined with clinical observations, patient history, and epidemiological information. The expected result is Negative.  Fact Sheet for Patients:  BloggerCourse.com  Fact Sheet for Healthcare Providers:  SeriousBroker.it  This test is no t yet approved or cleared by  the [place] FDA and  has been authorized for detection and/or diagnosis of SARS-CoV-2 by FDA under an Emergency Use Authorization (EUA). This EUA will remain  in effect (meaning this test can be used) for the duration of the COVID-19 declaration under Section 564(b)(1) of the Act, 21 U.S.C.section 360bbb-3(b)(1), unless the authorization is terminated  or revoked sooner.       Influenza A by PCR NEGATIVE NEGATIVE Final   Influenza B by PCR NEGATIVE NEGATIVE Final    Comment: (NOTE) The Xpert Xpress SARS-CoV-2/FLU/RSV plus assay is intended as an aid in the diagnosis of influenza from Nasopharyngeal swab specimens and should not be used as a sole basis for treatment. Nasal washings and aspirates are unacceptable for Xpert Xpress SARS-CoV-2/FLU/RSV testing.  Fact Sheet for Patients: BloggerCourse.com  Fact Sheet for Healthcare Providers: SeriousBroker.it  This test is not yet approved or cleared by the [place] FDA and has been authorized for detection and/or diagnosis of SARS-CoV-2 by FDA under an Emergency Use Authorization (EUA). This EUA will remain in effect (meaning this test can be used) for the duration of the COVID-19 declaration under Section 564(b)(1) of the Act, 21 U.S.C. section 360bbb-3(b)(1), unless the authorization is terminated or revoked.     Resp Syncytial Virus by PCR NEGATIVE NEGATIVE Final    Comment: (NOTE) Fact Sheet for Patients: BloggerCourse.com  Fact Sheet for Healthcare Providers: SeriousBroker.it  This test is not yet approved or cleared by the [place] FDA and has been authorized for detection and/or diagnosis of SARS-CoV-2 by FDA under an Emergency Use Authorization (EUA). This EUA will remain in effect (meaning this test can be used) for the duration of the COVID-19 declaration under Section 564(b)(1) of the Act, 21  U.S.C. section 360bbb-3(b)(1), unless the authorization is terminated or revoked.  Performed at [facility], [address] [place], [place] [address]   MRSA Next Gen by PCR, Nasal     Status: None  Collection Time: 12/23/23  6:07 PM   Specimen: Nasal Mucosa; Nasal Swab  Result Value Ref Range Status   MRSA by PCR Next Gen NOT DETECTED NOT DETECTED Final    Comment: (NOTE) The GeneXpert MRSA Assay (FDA approved for NASAL specimens only), is one component of a comprehensive MRSA colonization surveillance program. It is not intended to diagnose MRSA infection nor to guide or monitor treatment for MRSA infections. Test performance is not FDA approved in patients less than 48 years old. Performed at [facility], [address] [place], [place] [address]      Labs: BNP (last 3 results) Recent Labs    12/23/23 0430  BNP 37.0   Basic Metabolic Panel: Recent Labs  Lab 12/20/23 0922 12/23/23 0430 12/23/23 0615 12/24/23 0552  NA 138 135  --  138  K 3.6 3.2*  --  3.5  CL 105 104  --  105  CO2 25 21*  --  24  GLUCOSE 92 128*  --  142*  BUN 17 14  --  27*  CREATININE 0.66 0.55  --  0.82  CALCIUM 9.4 9.3  --  9.7  MG  --   --  2.0  --    Liver Function Tests: Recent Labs  Lab 12/23/23 0430  AST 16  ALT 30  ALKPHOS 74  BILITOT 0.6  PROT 7.3  ALBUMIN 4.2   No results for input(s): "LIPASE", "AMYLASE" in the last 168 hours. No results for input(s): "AMMONIA" in the last 168 hours. CBC: Recent Labs  Lab 12/23/23 0430  WBC 18.3*  HGB 15.3*  HCT 44.5  MCV 82.0  PLT 358   Cardiac Enzymes: No results for input(s): "CKTOTAL", "CKMB", "CKMBINDEX", "TROPONINI" in the last 168 hours. BNP: Invalid input(s): "POCBNP" CBG: No results for input(s): "GLUCAP" in the last 168 hours. D-Dimer Recent Labs    12/23/23 0430  DDIMER 0.50   Hgb A1c No results for input(s): "HGBA1C" in the last 72 hours. Lipid Profile No results for input(s): "CHOL", "HDL", "LDLCALC",  "TRIG", "CHOLHDL", "LDLDIRECT" in the last 72 hours. Thyroid function studies No results for input(s): "TSH", "T4TOTAL", "T3FREE", "THYROIDAB" in the last 72 hours.  Invalid input(s): "FREET3" Anemia work up No results for input(s): "VITAMINB12", "FOLATE", "FERRITIN", "TIBC", "IRON", "RETICCTPCT" in the last 72 hours. Urinalysis    Component Value Date/Time   COLORURINE YELLOW 03/15/2023 1848   APPEARANCEUR HAZY (A) 03/15/2023 1848   LABSPEC 1.017 03/15/2023 1848   PHURINE 5.0 03/15/2023 1848   GLUCOSEU NEGATIVE 03/15/2023 1848   HGBUR NEGATIVE 03/15/2023 1848   BILIRUBINUR NEGATIVE 03/15/2023 1848   KETONESUR NEGATIVE 03/15/2023 1848   PROTEINUR 30 (A) 03/15/2023 1848   UROBILINOGEN 0.2 07/15/2015 2233   NITRITE NEGATIVE 03/15/2023 1848   LEUKOCYTESUR NEGATIVE 03/15/2023 1848   Sepsis Labs Recent Labs  Lab 12/23/23 0430  WBC 18.3*   Microbiology Recent Results (from the past 240 hours)  Resp panel by RT-PCR (RSV, Flu A&B, Covid) Anterior Nasal Swab     Status: None   Collection Time: 12/23/23  4:30 AM   Specimen: Anterior Nasal Swab  Result Value Ref Range Status   SARS Coronavirus 2 by RT PCR NEGATIVE NEGATIVE Final    Comment: (NOTE) SARS-CoV-2 target nucleic acids are NOT DETECTED.  The SARS-CoV-2 RNA is generally detectable in upper respiratory specimens during the acute phase of infection. The lowest concentration of SARS-CoV-2 viral copies this assay can detect is 138 copies/mL. A negative result does not preclude SARS-Cov-2  infection and should not be used as the sole basis for treatment or other patient management decisions. A negative result may occur with  improper specimen collection/handling, submission of specimen other than nasopharyngeal swab, presence of viral mutation(s) within the areas targeted by this assay, and inadequate number of viral copies(<138 copies/mL). A negative result must be combined with clinical observations, patient history, and  epidemiological information. The expected result is Negative.  Fact Sheet for Patients:  BloggerCourse.com  Fact Sheet for Healthcare Providers:  SeriousBroker.it  This test is no t yet approved or cleared by the [place] FDA and  has been authorized for detection and/or diagnosis of SARS-CoV-2 by FDA under an Emergency Use Authorization (EUA). This EUA will remain  in effect (meaning this test can be used) for the duration of the COVID-19 declaration under Section 564(b)(1) of the Act, 21 U.S.C.section 360bbb-3(b)(1), unless the authorization is terminated  or revoked sooner.       Influenza A by PCR NEGATIVE NEGATIVE Final   Influenza B by PCR NEGATIVE NEGATIVE Final    Comment: (NOTE) The Xpert Xpress SARS-CoV-2/FLU/RSV plus assay is intended as an aid in the diagnosis of influenza from Nasopharyngeal swab specimens and should not be used as a sole basis for treatment. Nasal washings and aspirates are unacceptable for Xpert Xpress SARS-CoV-2/FLU/RSV testing.  Fact Sheet for Patients: BloggerCourse.com  Fact Sheet for Healthcare Providers: SeriousBroker.it  This test is not yet approved or cleared by the [place] FDA and has been authorized for detection and/or diagnosis of SARS-CoV-2 by FDA under an Emergency Use Authorization (EUA). This EUA will remain in effect (meaning this test can be used) for the duration of the COVID-19 declaration under Section 564(b)(1) of the Act, 21 U.S.C. section 360bbb-3(b)(1), unless the authorization is terminated or revoked.     Resp Syncytial Virus by PCR NEGATIVE NEGATIVE Final    Comment: (NOTE) Fact Sheet for Patients: BloggerCourse.com  Fact Sheet for Healthcare Providers: SeriousBroker.it  This test is not yet approved or cleared by the [place] FDA and has been  authorized for detection and/or diagnosis of SARS-CoV-2 by FDA under an Emergency Use Authorization (EUA). This EUA will remain in effect (meaning this test can be used) for the duration of the COVID-19 declaration under Section 564(b)(1) of the Act, 21 U.S.C. section 360bbb-3(b)(1), unless the authorization is terminated or revoked.  Performed at [facility], [address] [place], [place] [address]   MRSA Next Gen by PCR, Nasal     Status: None   Collection Time: 12/23/23  6:07 PM   Specimen: Nasal Mucosa; Nasal Swab  Result Value Ref Range Status   MRSA by PCR Next Gen NOT DETECTED NOT DETECTED Final    Comment: (NOTE) The GeneXpert MRSA Assay (FDA approved for NASAL specimens only), is one component of a comprehensive MRSA colonization surveillance program. It is not intended to diagnose MRSA infection nor to guide or monitor treatment for MRSA infections. Test performance is not FDA approved in patients less than 21 years old. Performed at [facility], [address] [place], [place] [address]     Time coordinating discharge: 41 mins   SIGNED:  Standley Dakins, MD  [facility] 12/25/2023, 10:33 AM How to contact the [facility] Attending or Consulting provider 7A - 7P or covering provider during after hours 7P -7A, for this patient?  Check the care team in [facility] and look for a) attending/consulting [facility] provider listed and b) the [facility] team listed Log into  [email or web address] and use [facility]'s universal password to access. If you do not have the password, please contact the hospital operator. Locate the [facility] provider you are looking for under [facility] and page to a number that you can be directly reached. If you still have difficulty reaching the provider, please page the [facility] (Director on Call) for the Hospitalists listed on amion for assistance.

## 2023-12-25 NOTE — Discharge Instructions (Signed)
IMPORTANT INFORMATION: PAY CLOSE ATTENTION   PHYSICIAN DISCHARGE INSTRUCTIONS  Follow with Primary care provider  Lupita Raider, NP  and other consultants as instructed by your Hospitalist Physician  SEEK MEDICAL CARE OR RETURN TO EMERGENCY ROOM IF SYMPTOMS COME BACK, WORSEN OR NEW PROBLEM DEVELOPS   Please note: You were cared for by a hospitalist during your hospital stay. Every effort will be made to forward records to your primary care provider.  You can request that your primary care provider send for your hospital records if they have not received them.  Once you are discharged, your primary care physician will handle any further medical issues. Please note that NO REFILLS for any discharge medications will be authorized once you are discharged, as it is imperative that you return to your primary care physician (or establish a relationship with a primary care physician if you do not have one) for your post hospital discharge needs so that they can reassess your need for medications and monitor your lab values.  Please get a complete blood count and chemistry panel checked by your Primary MD at your next visit, and again as instructed by your Primary MD.  Get Medicines reviewed and adjusted: Please take all your medications with you for your next visit with your Primary MD  Laboratory/radiological data: Please request your Primary MD to go over all hospital tests and procedure/radiological results at the follow up, please ask your primary care provider to get all Hospital records sent to his/her office.  In some cases, they will be blood work, cultures and biopsy results pending at the time of your discharge. Please request that your primary care provider follow up on these results.  If you are diabetic, please bring your blood sugar readings with you to your follow up appointment with primary care.    Please call and make your follow up appointments as soon as possible.    Also Note  the following: If you experience worsening of your admission symptoms, develop shortness of breath, life threatening emergency, suicidal or homicidal thoughts you must seek medical attention immediately by calling 911 or calling your MD immediately  if symptoms less severe.  You must read complete instructions/literature along with all the possible adverse reactions/side effects for all the Medicines you take and that have been prescribed to you. Take any new Medicines after you have completely understood and accpet all the possible adverse reactions/side effects.   Do not drive when taking Pain medications or sleeping medications (Benzodiazepines)  Do not take more than prescribed Pain, Sleep and Anxiety Medications. It is not advisable to combine anxiety,sleep and pain medications without talking with your primary care practitioner  Special Instructions: If you have smoked or chewed Tobacco  in the last 2 yrs please stop smoking, stop any regular Alcohol  and or any Recreational drug use.  Wear Seat belts while driving.  Do not drive if taking any narcotic, mind altering or controlled substances or recreational drugs or alcohol.

## 2023-12-25 NOTE — Progress Notes (Signed)
Nsg Discharge Note  Admit Date:  12/23/2023 Discharge date: 12/25/2023   [facility] to be D/C'd Home per MD order.  AVS completed.   Patient/caregiver able to verbalize understanding.  Discharge Medication: Allergies as of 12/25/2023       Reactions   Trazodone And Nefazodone Hives        Medication List     STOP taking these medications    azithromycin 250 MG tablet Commonly known as: ZITHROMAX       TAKE these medications    albuterol 108 (90 Base) MCG/ACT inhaler Commonly known as: VENTOLIN HFA Inhale 1-2 puffs into the lungs every 4 (four) hours as needed for wheezing or shortness of breath. What changed:  how much to take when to take this reasons to take this   ALPRAZolam 0.25 MG tablet Commonly known as: XANAX Take 1 tablet (0.25 mg total) by mouth at bedtime as needed for anxiety.   amLODipine 5 MG tablet Commonly known as: NORVASC Take 5 mg by mouth daily.   aspirin EC 325 MG tablet Take 325 mg by mouth daily.   benzonatate 100 MG capsule Commonly known as: TESSALON Take 1 capsule (100 mg total) by mouth 3 (three) times daily as needed for cough. Do not take with alcohol or while operating or driving heavy machinery   clopidogrel 75 MG tablet Commonly known as: PLAVIX Take 75 mg by mouth daily.   doxycycline 100 MG capsule Commonly known as: VIBRAMYCIN Take 1 capsule (100 mg total) by mouth 2 (two) times daily for 7 days.   FLUoxetine 10 MG capsule Commonly known as: PROZAC Take 10 mg by mouth daily.   hydrocortisone 25 MG suppository Commonly known as: ANUSOL-HC Place 25 mg rectally 2 (two) times daily as needed.   ipratropium-albuterol 0.5-2.5 (3) MG/3ML Soln Commonly known as: DUONEB Take 3 mLs by nebulization every 4 (four) hours as needed (wheezing, coughing, shortness of breath). What changed: See the new instructions.   linaclotide 290 MCG Caps capsule Commonly known as: Linzess Take 1 capsule (290 mcg total) by mouth daily  before breakfast.   lisinopril 40 MG tablet Commonly known as: ZESTRIL Take 40 mg by mouth daily.   pantoprazole 40 MG tablet Commonly known as: PROTONIX Take 1 tablet (40 mg total) by mouth daily. What changed:  when to take this reasons to take this   predniSONE 20 MG tablet Commonly known as: DELTASONE Take 2 tablets (40 mg total) by mouth daily with breakfast for 5 days.   rosuvastatin 40 MG tablet Commonly known as: Crestor Take 1 tablet (40 mg total) by mouth daily.   senna 8.6 MG Tabs tablet Commonly known as: SENOKOT Take 1 tablet by mouth daily.   Trelegy Ellipta 100-62.5-25 MCG/ACT Aepb Generic drug: Fluticasone-Umeclidin-Vilant Inhale 1 puff into the lungs daily.   valACYclovir 500 MG tablet Commonly known as: VALTREX Take 500 mg by mouth daily.        Discharge Assessment: Vitals:   12/25/23 0423 12/25/23 0839  BP: 129/72   Pulse: 65   Resp: (!) 21   Temp: 98 F (36.7 C)   SpO2: 93% 94%   Skin clean, dry and intact without evidence of skin break down, no evidence of skin tears noted. IV catheter discontinued intact. Site without signs and symptoms of complications - no redness or edema noted at insertion site, patient denies c/o pain - only slight tenderness at site.  Dressing with slight pressure applied.  D/c Instructions-Education: Discharge instructions  given to patient/family with verbalized understanding. D/c education completed with patient/family including follow up instructions, medication list, d/c activities limitations if indicated, with other d/c instructions as indicated by MD - patient able to verbalize understanding, all questions fully answered. Patient instructed to return to ED, call 911, or call MD for any changes in condition.  Patient escorted via WC, and D/C home via private auto.  Laurena Spies, RN 12/25/2023 10:41 AM

## 2023-12-27 ENCOUNTER — Encounter (HOSPITAL_COMMUNITY): Payer: Self-pay

## 2023-12-27 ENCOUNTER — Ambulatory Visit (HOSPITAL_COMMUNITY): Admit: 2023-12-27 | Payer: 59 | Admitting: Internal Medicine

## 2023-12-27 ENCOUNTER — Emergency Department (HOSPITAL_COMMUNITY): Payer: 59

## 2023-12-27 ENCOUNTER — Emergency Department (HOSPITAL_COMMUNITY)
Admission: EM | Admit: 2023-12-27 | Discharge: 2023-12-27 | Disposition: A | Discharge status: Home / Self Care | Payer: 59 | Attending: Emergency Medicine | Admitting: Emergency Medicine

## 2023-12-27 ENCOUNTER — Other Ambulatory Visit: Payer: Self-pay

## 2023-12-27 DIAGNOSIS — R0602 Shortness of breath: Secondary | ICD-10-CM | POA: Diagnosis not present

## 2023-12-27 DIAGNOSIS — Z79899 Other long term (current) drug therapy: Secondary | ICD-10-CM | POA: Diagnosis not present

## 2023-12-27 DIAGNOSIS — I10 Essential (primary) hypertension: Secondary | ICD-10-CM | POA: Diagnosis not present

## 2023-12-27 DIAGNOSIS — Z7902 Long term (current) use of antithrombotics/antiplatelets: Secondary | ICD-10-CM | POA: Diagnosis not present

## 2023-12-27 DIAGNOSIS — Z8673 Personal history of transient ischemic attack (TIA), and cerebral infarction without residual deficits: Secondary | ICD-10-CM | POA: Insufficient documentation

## 2023-12-27 DIAGNOSIS — Z7951 Long term (current) use of inhaled steroids: Secondary | ICD-10-CM | POA: Insufficient documentation

## 2023-12-27 DIAGNOSIS — Z7982 Long term (current) use of aspirin: Secondary | ICD-10-CM | POA: Diagnosis not present

## 2023-12-27 DIAGNOSIS — J441 Chronic obstructive pulmonary disease with (acute) exacerbation: Secondary | ICD-10-CM | POA: Insufficient documentation

## 2023-12-27 LAB — BASIC METABOLIC PANEL
Anion gap: 12 (ref 5–15)
BUN: 18 mg/dL (ref 8–23)
CO2: 25 mmol/L (ref 22–32)
Calcium: 9.4 mg/dL (ref 8.9–10.3)
Chloride: 104 mmol/L (ref 98–111)
Creatinine, Ser: 0.66 mg/dL (ref 0.44–1.00)
GFR, Estimated: >60 mL/min (ref >60)
Glucose, Bld: 121 mg/dL — ABNORMAL HIGH (ref 70–99)
Potassium: 3.6 mmol/L (ref 3.5–5.1)
Sodium: 141 mmol/L (ref 135–145)

## 2023-12-27 LAB — CBC WITH DIFFERENTIAL/PLATELET
Abs Immature Granulocytes: 0.1 10*3/uL — ABNORMAL HIGH (ref 0.00–0.07)
Basophils Absolute: 0.1 10*3/uL (ref 0.0–0.1)
Basophils Relative: 0 %
Eosinophils Absolute: 0.8 10*3/uL — ABNORMAL HIGH (ref 0.0–0.5)
Eosinophils Relative: 4 %
HCT: 48.1 % — ABNORMAL HIGH (ref 36.0–46.0)
Hemoglobin: 15.9 g/dL — ABNORMAL HIGH (ref 12.0–15.0)
Immature Granulocytes: 1 %
Lymphocytes Relative: 18 %
Lymphs Abs: 3.1 10*3/uL (ref 0.7–4.0)
MCH: 27.3 pg (ref 26.0–34.0)
MCHC: 33.1 g/dL (ref 30.0–36.0)
MCV: 82.6 fL (ref 80.0–100.0)
Monocytes Absolute: 1.2 10*3/uL — ABNORMAL HIGH (ref 0.1–1.0)
Monocytes Relative: 7 %
Neutro Abs: 12.5 10*3/uL — ABNORMAL HIGH (ref 1.7–7.7)
Neutrophils Relative %: 70 %
Platelets: 342 10*3/uL (ref 150–400)
RBC: 5.82 MIL/uL — ABNORMAL HIGH (ref 3.87–5.11)
RDW: 13.1 % (ref 11.5–15.5)
WBC: 17.8 10*3/uL — ABNORMAL HIGH (ref 4.0–10.5)
nRBC: 0 % (ref 0.0–0.2)

## 2023-12-27 LAB — TROPONIN I (HIGH SENSITIVITY)
Troponin I (High Sensitivity): 3 ng/L (ref <18)
Troponin I (High Sensitivity): <2 ng/L (ref <18)

## 2023-12-27 SURGERY — COLONOSCOPY WITH PROPOFOL
Anesthesia: Monitor Anesthesia Care

## 2023-12-27 MED ORDER — ALBUTEROL SULFATE HFA 108 (90 BASE) MCG/ACT IN AERS: 2.0000 | INHALATION_SPRAY | RESPIRATORY_TRACT | Status: DC | PRN

## 2023-12-27 MED ORDER — PREDNISONE 10 MG PO TABS
40.0000 mg | ORAL_TABLET | Freq: Every day | ORAL | 0 refills | Status: AC
Start: 2023-12-27 — End: 2024-01-01

## 2023-12-27 MED ORDER — DOXYCYCLINE HYCLATE 100 MG PO TABS
100.0000 mg | ORAL_TABLET | Freq: Once | ORAL | Status: AC
  Administered 2023-12-27: 100 mg via ORAL
  Filled 2023-12-27: qty 1

## 2023-12-27 MED ORDER — ALBUTEROL SULFATE (2.5 MG/3ML) 0.083% IN NEBU
10.0000 mg | INHALATION_SOLUTION | RESPIRATORY_TRACT | Status: DC
  Administered 2023-12-27: 10 mg via RESPIRATORY_TRACT

## 2023-12-27 MED ORDER — IPRATROPIUM-ALBUTEROL 0.5-2.5 (3) MG/3ML IN SOLN
3.0000 mL | Freq: Once | RESPIRATORY_TRACT | Status: AC
  Administered 2023-12-27: 3 mL via RESPIRATORY_TRACT
  Filled 2023-12-27: qty 3

## 2023-12-27 MED ORDER — DOXYCYCLINE HYCLATE 100 MG PO CAPS: 100.0000 mg | ORAL_CAPSULE | Freq: Two times a day (BID) | ORAL | 0 refills | Status: DC

## 2023-12-27 MED ORDER — METHYLPREDNISOLONE SODIUM SUCC 125 MG IJ SOLR
125.0000 mg | Freq: Once | INTRAMUSCULAR | Status: AC
  Administered 2023-12-27: 125 mg via INTRAVENOUS
  Filled 2023-12-27: qty 2

## 2023-12-27 MED ORDER — ALBUTEROL (5 MG/ML) CONTINUOUS INHALATION SOLN
10.0000 mg/h | INHALATION_SOLUTION | Freq: Once | RESPIRATORY_TRACT | Status: DC
Start: 2023-12-27 — End: 2023-12-27
  Filled 2023-12-27: qty 20

## 2023-12-27 NOTE — ED Provider Notes (Cosign Needed)
[place] EMERGENCY DEPARTMENT AT [facility] Provider Note   CSN: [id number] Arrival date & time: 12/27/23  1348     History  Chief Complaint  Patient presents with   Shortness of Breath    Diamond Adams is a 63 y.o. female.  Patient is a 63 year old female with a past medical history of hypertension, anemia, CVA, COPD who presents to the emergency department with a chief complaint of shortness of breath, cough.  Patient notes that she was recently discharged from the hospital secondary to a COPD exacerbation.  Patient notes that she was feeling better upon discharge but notes that symptoms have worsened over the past 24 hours.  Patient notes that she has been using her home breathing treatments with no improvement in her symptoms.  Patient notes that she is not currently taking any steroids at this time though discharge records demonstrate that that she was discharged on prednisone.  Patient notes that she has had no associated productive cough.  She denies any fever, chills, abdominal pain, nausea, vomiting, diarrhea.  She does admit to associated chest tightness.  She denies any associated edema.   Shortness of Breath Associated symptoms: cough and wheezing        Home Medications Prior to Admission medications   Medication Sig Start Date End Date Taking? Authorizing Provider  albuterol (VENTOLIN HFA) 108 (90 Base) MCG/ACT inhaler Inhale 1-2 puffs into the lungs every 4 (four) hours as needed for wheezing or shortness of breath. 12/25/23   Johnson, Clanford L, MD  ALPRAZolam (XANAX) 0.25 MG tablet Take 1 tablet (0.25 mg total) by mouth at bedtime as needed for anxiety. 05/26/22   Setzer, Lynnell Jude, PA-C  amLODipine (NORVASC) 5 MG tablet Take 5 mg by mouth daily. 11/09/23   [provider]  aspirin EC 325 MG tablet Take 325 mg by mouth daily.    [provider]  benzonatate (TESSALON) 100 MG capsule Take 1 capsule (100 mg total) by mouth 3 (three) times  daily as needed for cough. Do not take with alcohol or while operating or driving heavy machinery 03/01/80   Valentino Nose, NP  clopidogrel (PLAVIX) 75 MG tablet Take 75 mg by mouth daily. 09/01/22   [provider]  doxycycline (VIBRAMYCIN) 100 MG capsule Take 1 capsule (100 mg total) by mouth 2 (two) times daily for 7 days. 12/21/23 12/28/23  Valentino Nose, NP  FLUoxetine (PROZAC) 10 MG capsule Take 10 mg by mouth daily. 09/12/23   [provider]  hydrocortisone (ANUSOL-HC) 25 MG suppository Place 25 mg rectally 2 (two) times daily as needed. 10/25/23   [provider]  ipratropium-albuterol (DUONEB) 0.5-2.5 (3) MG/3ML SOLN Take 3 mLs by nebulization every 4 (four) hours as needed (wheezing, coughing, shortness of breath). 12/25/23   Cleora Fleet, MD  linaclotide [facility]) 290 MCG CAPS capsule Take 1 capsule (290 mcg total) by mouth daily before breakfast. 11/02/23 11/01/24  Lanelle Bal, DO  lisinopril (ZESTRIL) 40 MG tablet Take 40 mg by mouth daily. 06/22/22   [provider]  pantoprazole (PROTONIX) 40 MG tablet Take 1 tablet (40 mg total) by mouth daily. 12/25/23   Johnson, Clanford L, MD  rosuvastatin (CRESTOR) 40 MG tablet Take 1 tablet (40 mg total) by mouth daily. 11/09/22   Victorino Sparrow, MD  senna (SENOKOT) 8.6 MG TABS tablet Take 1 tablet by mouth daily.    [provider]  TRELEGY ELLIPTA 100-62.5-25 MCG/ACT AEPB Inhale 1 puff  into the lungs daily. 09/07/23   [provider]  valACYclovir (VALTREX) 500 MG tablet Take 500 mg by mouth daily. 07/02/23   [provider]      Allergies    Trazodone and nefazodone    Review of Systems   Review of Systems  Respiratory:  Positive for cough, shortness of breath and wheezing.   All other systems reviewed and are negative.   Physical Exam Updated Vital Signs BP 126/82 (BP Location: Right Arm)   Pulse 98   Temp 98.1 F (36.7 C) (Oral)   Resp 20   Ht 4'  10" (1.473 m)   Wt 70 kg   SpO2 97%   BMI 32.25 kg/m  Physical Exam Constitutional:      Appearance: Normal appearance.  HENT:     Head: Normocephalic and atraumatic.     Nose: Nose normal.     Mouth/Throat:     Mouth: Mucous membranes are moist.  Eyes:     Extraocular Movements: Extraocular movements intact.     Conjunctiva/sclera: Conjunctivae normal.     Pupils: Pupils are equal, round, and reactive to light.  Cardiovascular:     Rate and Rhythm: Normal rate and regular rhythm.     Pulses: Normal pulses.     Heart sounds: Normal heart sounds.  Pulmonary:     Effort: Tachypnea and respiratory distress present.     Breath sounds: Normal breath sounds.     Comments: Diffuse expiratory wheezing Abdominal:     General: Abdomen is flat. Bowel sounds are normal.     Palpations: Abdomen is soft.     Tenderness: There is no abdominal tenderness. There is no guarding.  Musculoskeletal:        General: Normal range of motion.     Cervical back: Normal range of motion and neck supple.     Right lower leg: No edema.     Left lower leg: No edema.  Skin:    General: Skin is warm and dry.  Neurological:     General: No focal deficit present.     Mental Status: She is alert and oriented to person, place, and time. Mental status is at baseline.  Psychiatric:        Mood and Affect: Mood normal.        Behavior: Behavior normal.        Thought Content: Thought content normal.        Judgment: Judgment normal.     ED Results / Procedures / Treatments   Labs (all labs ordered are listed, but only abnormal results are displayed) Labs Reviewed  CBC WITH DIFFERENTIAL/PLATELET  BASIC METABOLIC PANEL  TROPONIN I (HIGH SENSITIVITY)    EKG None  Radiology DG Chest 2 View Result Date: 12/27/2023 CLINICAL DATA:  Shortness of breath EXAM: CHEST - 2 VIEW COMPARISON:  Chest radiograph dated 12/23/2023 FINDINGS: Normal lung volumes. No focal consolidations. No pleural effusion or  pneumothorax. The heart size and mediastinal contours are within normal limits. No acute osseous abnormality. IMPRESSION: No active cardiopulmonary disease. Electronically Signed   By: Agustin Cree M.D.   On: 12/27/2023 16:15    Procedures Procedures    Medications Ordered in ED Medications  albuterol (VENTOLIN HFA) 108 (90 Base) MCG/ACT inhaler 2 puff (has no administration in time range)  albuterol (PROVENTIL,VENTOLIN) solution continuous neb (has no administration in time range)  methylPREDNISolone sodium succinate (SOLU-MEDROL) 125 mg/2 mL injection 125 mg (has no administration in time range)  ED Course/ Medical Decision Making/ A&P                                 Medical Decision Making Patient is doing much better at this time and wheezing has greatly improved on physical exam.  Patient notes that she does feel much better and does feel stable for discharge home.  Patient has no associated acute consolidation noted on x-ray.  Blood work has been otherwise unremarkable.  Patient has no acute ischemic changes on EKG and negative serial troponins and do not suspect ACS.  Do not suspect the patient is suffering from pulmonary embolus at this point and feel that symptoms are secondary to COPD exacerbation.  Patient has not been taking the prednisone she was previously prescribed and we will represcribe this medication as well as doxycycline at this time.  The adherence to medications was discussed with the patient.  Patient has had no associated hypoxia in the emergency department.  Discussed with patient about the importance of close follow-up with her primary care doctor on outpatient basis as well as strict turn precautions for any new or worsening symptoms.  Patient and husband voiced understanding to the plan and had no additional questions.  Amount and/or Complexity of Data Reviewed Labs: ordered. Radiology: ordered.  Risk Prescription drug management.           Final  Clinical Impression(s) / ED Diagnoses Final diagnoses:  None    Rx / DC Orders ED Discharge Orders     None         Lelon Perla, PA-C 12/27/23 2148

## 2023-12-27 NOTE — ED Triage Notes (Signed)
Pt arrived via POV from home c/o SOB, difficulty breathing and reports her breathing treatments at home have not relieved any of her symptoms.

## 2023-12-27 NOTE — ED Provider Notes (Signed)
Patient has chronic asthma and lung disease, presents with ongoing wheezing after recent admission to the hospital.  She was given steroids in the hospital, not discharged on any steroids according to the patient.  On my exam she has diffuse expiratory wheezing in all lung fields but is not hypoxic or tachycardic or febrile.  Her x-ray shows no signs of pneumonia.  She will need some more breathing treatments here and I anticipated discharge with steroids.  The patient is agreeable to the plan   Eber Hong, MD 12/27/23 1924

## 2023-12-30 ENCOUNTER — Telehealth: Payer: Self-pay | Admitting: *Deleted

## 2023-12-30 DIAGNOSIS — J449 Chronic obstructive pulmonary disease, unspecified: Secondary | ICD-10-CM | POA: Diagnosis not present

## 2023-12-30 DIAGNOSIS — Z87891 Personal history of nicotine dependence: Secondary | ICD-10-CM | POA: Diagnosis not present

## 2023-12-30 NOTE — Telephone Encounter (Signed)
 Pt called in to see about rescheduling her procedure she had to cancel. She has had 3 COPD exacerbations since OV. Do you want us  to reschedule or OV prior?

## 2023-12-31 NOTE — Telephone Encounter (Signed)
 Does not need OV thank you

## 2024-01-06 DIAGNOSIS — R5383 Other fatigue: Secondary | ICD-10-CM | POA: Diagnosis not present

## 2024-01-06 DIAGNOSIS — R002 Palpitations: Secondary | ICD-10-CM | POA: Diagnosis not present

## 2024-01-06 DIAGNOSIS — R52 Pain, unspecified: Secondary | ICD-10-CM | POA: Diagnosis not present

## 2024-01-06 DIAGNOSIS — D509 Iron deficiency anemia, unspecified: Secondary | ICD-10-CM | POA: Diagnosis not present

## 2024-01-06 DIAGNOSIS — D508 Other iron deficiency anemias: Secondary | ICD-10-CM | POA: Diagnosis not present

## 2024-01-07 ENCOUNTER — Other Ambulatory Visit (HOSPITAL_COMMUNITY): Payer: Self-pay | Admitting: Family Medicine

## 2024-01-07 ENCOUNTER — Ambulatory Visit (HOSPITAL_COMMUNITY)
Admission: RE | Admit: 2024-01-07 | Discharge: 2024-01-07 | Disposition: A | Discharge status: Home / Self Care | Payer: 59 | Source: Ambulatory Visit | Attending: Family Medicine | Admitting: Family Medicine

## 2024-01-07 DIAGNOSIS — D72829 Elevated white blood cell count, unspecified: Secondary | ICD-10-CM | POA: Diagnosis not present

## 2024-01-07 DIAGNOSIS — D72828 Other elevated white blood cell count: Secondary | ICD-10-CM | POA: Diagnosis not present

## 2024-01-17 NOTE — Telephone Encounter (Signed)
Called pt, LMOVM to call back. 

## 2024-02-17 ENCOUNTER — Encounter (HOSPITAL_COMMUNITY)
Admission: RE | Admit: 2024-02-17 | Discharge: 2024-02-17 | Disposition: A | Discharge status: Home / Self Care | Source: Ambulatory Visit | Attending: Internal Medicine | Admitting: Internal Medicine

## 2024-02-17 ENCOUNTER — Encounter (HOSPITAL_COMMUNITY): Payer: Self-pay

## 2024-02-17 ENCOUNTER — Other Ambulatory Visit: Payer: Self-pay

## 2024-02-17 HISTORY — DX: Prediabetes: R73.03

## 2024-02-21 ENCOUNTER — Encounter (HOSPITAL_COMMUNITY): Payer: Self-pay | Admitting: Internal Medicine

## 2024-02-21 ENCOUNTER — Ambulatory Visit (HOSPITAL_COMMUNITY): Admitting: Anesthesiology

## 2024-02-21 ENCOUNTER — Encounter (HOSPITAL_COMMUNITY)
Admission: RE | Disposition: A | Discharge status: Home / Self Care | Payer: Self-pay | Source: Home / Self Care | Attending: Internal Medicine

## 2024-02-21 ENCOUNTER — Ambulatory Visit (HOSPITAL_COMMUNITY)
Admission: RE | Admit: 2024-02-21 | Discharge: 2024-02-21 | Disposition: A | Discharge status: Home / Self Care | Attending: Internal Medicine | Admitting: Internal Medicine

## 2024-02-21 DIAGNOSIS — K5730 Diverticulosis of large intestine without perforation or abscess without bleeding: Secondary | ICD-10-CM

## 2024-02-21 DIAGNOSIS — R7303 Prediabetes: Secondary | ICD-10-CM | POA: Diagnosis not present

## 2024-02-21 DIAGNOSIS — D123 Benign neoplasm of transverse colon: Secondary | ICD-10-CM

## 2024-02-21 DIAGNOSIS — K219 Gastro-esophageal reflux disease without esophagitis: Secondary | ICD-10-CM | POA: Insufficient documentation

## 2024-02-21 DIAGNOSIS — Z8673 Personal history of transient ischemic attack (TIA), and cerebral infarction without residual deficits: Secondary | ICD-10-CM | POA: Diagnosis not present

## 2024-02-21 DIAGNOSIS — I10 Essential (primary) hypertension: Secondary | ICD-10-CM

## 2024-02-21 DIAGNOSIS — K649 Unspecified hemorrhoids: Secondary | ICD-10-CM

## 2024-02-21 DIAGNOSIS — Z1211 Encounter for screening for malignant neoplasm of colon: Secondary | ICD-10-CM

## 2024-02-21 DIAGNOSIS — M797 Fibromyalgia: Secondary | ICD-10-CM | POA: Insufficient documentation

## 2024-02-21 DIAGNOSIS — Z87891 Personal history of nicotine dependence: Secondary | ICD-10-CM | POA: Diagnosis not present

## 2024-02-21 DIAGNOSIS — J449 Chronic obstructive pulmonary disease, unspecified: Secondary | ICD-10-CM | POA: Diagnosis not present

## 2024-02-21 HISTORY — PX: COLONOSCOPY: SHX5424

## 2024-02-21 HISTORY — PX: POLYPECTOMY: SHX5525

## 2024-02-21 LAB — GLUCOSE, CAPILLARY: Glucose-Capillary: 97 mg/dL (ref 70–99)

## 2024-02-21 SURGERY — COLONOSCOPY
Anesthesia: General

## 2024-02-21 MED ORDER — PROPOFOL 10 MG/ML IV BOLUS
INTRAVENOUS | Status: DC | PRN
  Administered 2024-02-21 (×5): 50 mg via INTRAVENOUS

## 2024-02-21 MED ORDER — LACTATED RINGERS IV SOLN: INTRAVENOUS | Status: DC | PRN

## 2024-02-21 MED ORDER — GLYCOPYRROLATE PF 0.2 MG/ML IJ SOSY
PREFILLED_SYRINGE | INTRAMUSCULAR | Status: DC | PRN
  Administered 2024-02-21: .2 mg via INTRAVENOUS

## 2024-02-21 MED ORDER — PHENYLEPHRINE 80 MCG/ML (10ML) SYRINGE FOR IV PUSH (FOR BLOOD PRESSURE SUPPORT)
PREFILLED_SYRINGE | INTRAVENOUS | Status: DC | PRN
  Administered 2024-02-21: 160 ug via INTRAVENOUS

## 2024-02-21 NOTE — Anesthesia Postprocedure Evaluation (Signed)
 Anesthesia Post Note  Patient: Diamond Adams  Procedure(s) Performed: COLONOSCOPY POLYPECTOMY  Patient location during evaluation: PACU Anesthesia Type: General Level of consciousness: awake and alert Pain management: pain level controlled Vital Signs Assessment: post-procedure vital signs reviewed and stable Respiratory status: spontaneous breathing, nonlabored ventilation, respiratory function stable and patient connected to nasal cannula oxygen Cardiovascular status: blood pressure returned to baseline and stable Postop Assessment: no apparent nausea or vomiting Anesthetic complications: no   There were no known notable events for this encounter.   Last Vitals:  Vitals:   02/21/24 1142 02/21/24 1146  BP:  (!) 95/50  Pulse:    Resp:    Temp: (!) 36.4 C   SpO2:      Last Pain:  Vitals:   02/21/24 1142  TempSrc: Oral  PainSc:                  Gaetano Hawthorne

## 2024-02-21 NOTE — Anesthesia Preprocedure Evaluation (Signed)
 Anesthesia Evaluation  Patient identified by MRN, date of birth, ID band Patient awake    Reviewed: Allergy & Precautions, NPO status , Patient's Chart, lab work & pertinent test results  History of Anesthesia Complications (+) PONV and history of anesthetic complications  Airway Mallampati: II  TM Distance: >3 FB Neck ROM: Full    Dental no notable dental hx. (+) Dental Advisory Given, Teeth Intact   Pulmonary COPD, former smoker Acute respiratory failure history   Pulmonary exam normal breath sounds clear to auscultation       Cardiovascular hypertension, Pt. on medications + Peripheral Vascular Disease  Normal cardiovascular exam Rhythm:Regular Rate:Normal   Echo 05/19/22: IMPRESSIONS  1. Left ventricular ejection fraction, by estimation, is 60 to 65%. The  left ventricle has normal function. The left ventricle has no regional  wall motion abnormalities. Left ventricular diastolic parameters were  normal.  2. Right ventricular systolic function is normal. The right ventricular  size is normal.  3. The mitral valve is normal in structure. No evidence of mitral valve  regurgitation. No evidence of mitral stenosis.  4. The aortic valve is normal in structure. Aortic valve regurgitation is  not visualized. No aortic stenosis is present.  5. The inferior vena cava is normal in size with greater than 50%  respiratory variability, suggesting right atrial pressure of 3 mmHg.      Neuro/Psych  PSYCHIATRIC DISORDERS Anxiety Depression    Optic atrophy. History carotid stenosis  Neuromuscular disease CVA    GI/Hepatic Neg liver ROS,GERD  ,,  Endo/Other  prediabetes  Renal/GU negative Renal ROS     Musculoskeletal  (+)  Fibromyalgia -  Abdominal   Peds  Hematology negative hematology ROS (+)   Anesthesia Other Findings   Reproductive/Obstetrics                             Lab Results   Component Value Date   WBC 17.8 (H) 12/27/2023   HGB 15.9 (H) 12/27/2023   HCT 48.1 (H) 12/27/2023   MCV 82.6 12/27/2023   PLT 342 12/27/2023   Lab Results  Component Value Date   CREATININE 0.66 12/27/2023   BUN 18 12/27/2023   NA 141 12/27/2023   K 3.6 12/27/2023   CL 104 12/27/2023   CO2 25 12/27/2023    Anesthesia Physical Anesthesia Plan  ASA: 3  Anesthesia Plan: General   Post-op Pain Management: Minimal or no pain anticipated   Induction: Intravenous  PONV Risk Score and Plan:   Airway Management Planned: Nasal Cannula and Natural Airway  Additional Equipment:   Intra-op Plan:   Post-operative Plan:   Informed Consent: I have reviewed the patients History and Physical, chart, labs and discussed the procedure including the risks, benefits and alternatives for the proposed anesthesia with the patient or authorized representative who has indicated his/her understanding and acceptance.     Dental advisory given  Plan Discussed with: CRNA  Anesthesia Plan Comments: ( )        Anesthesia Quick Evaluation

## 2024-02-21 NOTE — Transfer of Care (Signed)
 Immediate Anesthesia Transfer of Care Note  Patient: Diamond Adams  Procedure(s) Performed: COLONOSCOPY POLYPECTOMY  Patient Location: Endoscopy Unit  Anesthesia Type:General  Level of Consciousness: drowsy  Airway & Oxygen Therapy: Patient Spontanous Breathing  Post-op Assessment: Report given to RN and Post -op Vital signs reviewed and stable  Post vital signs: Reviewed and stable  Last Vitals:  Vitals Value Taken Time  BP 84/54 02/21/24 1135  Temp 35.7 C 02/21/24 1135  Pulse 76 02/21/24 1135  Resp 14 02/21/24 1135  SpO2      Last Pain:  Vitals:   02/21/24 1135  TempSrc: Axillary  PainSc:          Complications: No notable events documented.

## 2024-02-21 NOTE — H&P (Signed)
 Primary Care Physician:  Lupita Raider, NP Primary Gastroenterologist:  Dr. Marletta Lor  Pre-Procedure History & Physical: HPI:  Diamond Adams is a 63 y.o. female is here for a colonoscopy for colon cancer screening purposes.   Past Medical History:  Diagnosis Date   Anxiety disorder    Atherosclerosis    COPD (chronic obstructive pulmonary disease) (HCC)    Depression    Fibromyalgia    GERD (gastroesophageal reflux disease)    Herpes    Hypercholesterolemia    Hypertension    Neuropathy    Optic atrophy    Optic atrophy    PONV (postoperative nausea and vomiting)    Pre-diabetes    Prediabetes    Senile purpura (HCC)    Stroke [facility])     Past Surgical History:  Procedure Laterality Date   ABDOMINAL HYSTERECTOMY  1994   AORTIC ARCH ANGIOGRAPHY N/A 05/20/2022   Procedure: AORTIC ARCH ANGIOGRAPHY;  Surgeon: Victorino Sparrow, MD;  Location: [facility] INVASIVE CV LAB;  Service: Cardiovascular;  Laterality: N/A;   CAROTID ANGIOGRAPHY N/A 05/20/2022   Procedure: CAROTID ANGIOGRAPHY;  Surgeon: Victorino Sparrow, MD;  Location: [facility] INVASIVE CV LAB;  Service: Cardiovascular;  Laterality: N/A;   CARPAL TUNNEL RELEASE  2013,2011   right and left   EXCISION NASAL MASS Left 09/25/2013   Procedure: EXCISION NASAL CANCER WITH SPLIT THICKNESS SKIN GRAFT FROM RIGHT THIGH;  Surgeon: Darletta Moll, MD;  Location: [facility];  Service: ENT;  Laterality: Left;   RIGHT OOPHORECTOMY     THUMB ARTHROSCOPY     right   TONSILLECTOMY     TRANSCAROTID ARTERY REVASCULARIZATION  Right 05/28/2022   Procedure: Transcarotid Artery Revascularization;  Surgeon: Victorino Sparrow, MD;  Location: [facility] OR;  Service: Vascular;  Laterality: Right;   TUBAL LIGATION      Prior to Admission medications   Medication Sig Start Date End Date Taking? Authorizing Provider  albuterol (VENTOLIN HFA) 108 (90 Base) MCG/ACT inhaler Inhale 1-2 puffs into the lungs every 4 (four) hours as needed for wheezing or shortness of  breath. 12/25/23  Yes Johnson, Clanford L, MD  ALPRAZolam (XANAX) 0.25 MG tablet Take 1 tablet (0.25 mg total) by mouth at bedtime as needed for anxiety. 05/26/22  Yes Setzer, Lynnell Jude, PA-C  amLODipine (NORVASC) 5 MG tablet Take 5 mg by mouth daily. 11/09/23  Yes [provider]  aspirin EC 325 MG tablet Take 325 mg by mouth daily.   Yes [provider]  clopidogrel (PLAVIX) 75 MG tablet Take 75 mg by mouth daily. 09/01/22  Yes [provider]  doxycycline (VIBRAMYCIN) 100 MG capsule Take 1 capsule (100 mg total) by mouth 2 (two) times daily. 12/27/23  Yes Lelon Perla, PA-C  hydrocortisone (ANUSOL-HC) 25 MG suppository Place 25 mg rectally 2 (two) times daily as needed. 10/25/23  Yes [provider]  ipratropium-albuterol (DUONEB) 0.5-2.5 (3) MG/3ML SOLN Take 3 mLs by nebulization every 4 (four) hours as needed (wheezing, coughing, shortness of breath). 12/25/23  Yes Johnson, Clanford L, MD  linaclotide [facility]) 290 MCG CAPS capsule Take 1 capsule (290 mcg total) by mouth daily before breakfast. 11/02/23 11/01/24 Yes Masaichi Kracht K, DO  lisinopril (ZESTRIL) 40 MG tablet Take 40 mg by mouth daily. 06/22/22  Yes [provider]  pantoprazole (PROTONIX) 40 MG tablet Take 1 tablet (40 mg total) by mouth daily. 12/25/23  Yes Johnson, Clanford L, MD  rosuvastatin (CRESTOR) 40 MG tablet Take 1 tablet (40  mg total) by mouth daily. 11/09/22  Yes Victorino Sparrow, MD  senna (SENOKOT) 8.6 MG TABS tablet Take 1 tablet by mouth daily.   Yes [provider]  benzonatate (TESSALON) 100 MG capsule Take 1 capsule (100 mg total) by mouth 3 (three) times daily as needed for cough. Do not take with alcohol or while operating or driving heavy machinery 1/61/09   Valentino Nose, NP  FLUoxetine (PROZAC) 10 MG capsule Take 10 mg by mouth daily. 09/12/23   [provider]  TRELEGY ELLIPTA 100-62.5-25 MCG/ACT AEPB Inhale 1 puff into the lungs daily.  09/07/23   [provider]  valACYclovir (VALTREX) 500 MG tablet Take 500 mg by mouth daily. 07/02/23   [provider]    Allergies as of 02/01/2024 - Review Complete 12/27/2023  Allergen Reaction Noted   Trazodone and nefazodone Hives 04/03/2013    Family History  Problem Relation Age of Onset   Emphysema Mother    Obesity Mother    Heart disease Father    High blood pressure Father    Cancer Sister        cervical   Stroke Neg Hx     Social History   Socioeconomic History   Marital status: Divorced    Spouse name: Not on file   Number of children: Not on file   Years of education: Not on file   Highest education level: Not on file  Occupational History   Occupation: Disabled  Tobacco Use   Smoking status: Former    Current packs/day: 0.00    Types: Cigarettes    Quit date: 05/17/2022    Years since quitting: 1.7    Passive exposure: Never   Smokeless tobacco: Never   Tobacco comments:    Last time smoking was 05/20/22.  Vaping Use   Vaping status: Never Used  Substance and Sexual Activity   Alcohol use: No   Drug use: No   Sexual activity: Yes    Birth control/protection: Surgical  Other Topics Concern   Not on file  Social History Narrative   Not on file   Social Drivers of Health   Financial Resource Strain: Not on file  Food Insecurity: No Food Insecurity (12/23/2023)   Hunger Vital Sign    Worried About Running Out of Food in the Last Year: Never true    Ran Out of Food in the Last Year: Never true  Transportation Needs: No Transportation Needs (12/23/2023)   PRAPARE - Administrator, Civil Service (Medical): No    Lack of Transportation (Non-Medical): No  Physical Activity: Not on file  Stress: Not on file  Social Connections: Not on file  Intimate Partner Violence: Not At Risk (12/23/2023)   Humiliation, Afraid, Rape, and Kick questionnaire    Fear of Current or Ex-Partner: No    Emotionally Abused: No    Physically  Abused: No    Sexually Abused: No    Review of Systems: See HPI, otherwise negative ROS  Physical Exam: Vital signs in last 24 hours: Temp:  [98.3 F (36.8 C)] 98.3 F (36.8 C) (03/31 1059) Pulse Rate:  [83] 83 (03/31 1059) Resp:  [10] 10 (03/31 1059) BP: (122)/(60) 122/60 (03/31 1059) SpO2:  [99 %] 99 % (03/31 1059) Weight:  [68 kg] 68 kg (03/31 1059)   General:   Alert,  Well-developed, well-nourished, pleasant and cooperative in NAD Head:  Normocephalic and atraumatic. Eyes:  Sclera clear, no icterus.   Conjunctiva  pink. Ears:  Normal auditory acuity. Nose:  No deformity, discharge,  or lesions. Msk:  Symmetrical without gross deformities. Normal posture. Extremities:  Without clubbing or edema. Neurologic:  Alert and  oriented x4;  grossly normal neurologically. Skin:  Intact without significant lesions or rashes. Psych:  Alert and cooperative. Normal mood and affect.  Impression/Plan: Diamond Adams is here for a colonoscopy to be performed for colon cancer screening purposes.  The risks of the procedure including infection, bleed, or perforation as well as benefits, limitations, alternatives and imponderables have been reviewed with the patient. Questions have been answered. All parties agreeable.

## 2024-02-21 NOTE — Op Note (Signed)
 [facility] Patient Name: Diamond Adams Procedure Date: 02/21/2024 10:54 AM MRN: [id number] Date of Birth: May 22, 1961 Attending MD: Hennie Duos. Marletta Lor , [place], [id number] CSN: [id number] Age: 63 Admit Type: Outpatient Procedure:                Colonoscopy Indications:              Screening for colorectal malignant neoplasm Providers:                Hennie Duos. Marletta Lor, DO, Tammy Vaught, RN, Dyann Ruddle Referring MD:              Medicines:                See the Anesthesia note for documentation of the                            administered medications Complications:            No immediate complications. Estimated Blood Loss:     Estimated blood loss was minimal. Procedure:                Pre-Anesthesia Assessment:                           - The anesthesia plan was to use monitored                            anesthesia care (MAC).                           After obtaining informed consent, the colonoscope                            was passed under direct vision. Throughout the                            procedure, the patient's blood pressure, pulse, and                            oxygen saturations were monitored continuously. The                            PCF-HQ190L ([id number]) scope was introduced through                            the anus and advanced to the the cecum, identified                            by appendiceal orifice and ileocecal valve. The                            colonoscopy was performed without difficulty. The                            patient tolerated the procedure well. The  quality                            of the bowel preparation was evaluated using the                            BBPS [facility] Bowel Preparation Scale) with scores                            of: Right Colon = 2 (minor amount of residual                            staining, small fragments of stool and/or opaque                            liquid, but mucosa seen  well), Transverse Colon = 2                            (minor amount of residual staining, small fragments                            of stool and/or opaque liquid, but mucosa seen                            well) and Left Colon = 2 (minor amount of residual                            staining, small fragments of stool and/or opaque                            liquid, but mucosa seen well). The total BBPS score                            equals 6. The quality of the bowel preparation was                            good. Scope In: 11:15:45 AM Scope Out: 11:31:54 AM Scope Withdrawal Time: 0 hours 12 minutes 44 seconds  Total Procedure Duration: 0 hours 16 minutes 9 seconds  Findings:      Hemorrhoids were found on perianal exam.      Multiple large-mouthed and small-mouthed diverticula were found in the       sigmoid colon, descending colon and transverse colon.      A 5 mm polyp was found in the transverse colon. The polyp was sessile.       The polyp was removed with a cold snare. Resection was complete, but the       polyp tissue was not retrieved. Impression:               - Hemorrhoids found on perianal exam.                           - Diverticulosis in the sigmoid colon, in the  descending colon and in the transverse colon.                           - One 5 mm polyp in the transverse colon, removed                            with a cold snare. Complete resection. Polyp tissue                            not retrieved. Moderate Sedation:      Per Anesthesia Care Recommendation:           - Patient has a contact number available for                            emergencies. The signs and symptoms of potential                            delayed complications were discussed with the                            patient. Return to normal activities tomorrow.                            Written discharge instructions were provided to the                             patient.                           - Resume previous diet.                           - Continue present medications.                           - Await pathology results.                           - Repeat colonoscopy in 7 years for surveillance.                           - Return to GI clinic in 3 months. Procedure Code(s):        --- Professional ---                           [phone], Colonoscopy, flexible; with removal of                            tumor(s), polyp(s), or other lesion(s) by snare                            technique Diagnosis Code(s):        --- Professional ---  Z12.11, Encounter for screening for malignant                            neoplasm of colon                           K64.9, Unspecified hemorrhoids                           D12.3, Benign neoplasm of transverse colon (hepatic                            flexure or splenic flexure)                           K57.30, Diverticulosis of large intestine without                            perforation or abscess without bleeding CPT copyright 2022 American Medical Association. All rights reserved. The codes documented in this report are preliminary and upon coder review may  be revised to meet current compliance requirements. Hennie Duos. Marletta Lor, DO Hennie Duos. Marletta Lor, DO 02/21/2024 11:39:11 AM This report has been signed electronically. Number of Addenda: 0

## 2024-02-21 NOTE — Discharge Instructions (Addendum)
  Colonoscopy Discharge Instructions  Read the instructions outlined below and refer to this sheet in the next few weeks. These discharge instructions provide you with general information on caring for yourself after you leave the hospital. Your doctor may also give you specific instructions. While your treatment has been planned according to the most current medical practices available, unavoidable complications occasionally occur.   ACTIVITY You may resume your regular activity, but move at a slower pace for the next 24 hours.  Take frequent rest periods for the next 24 hours.  Walking will help get rid of the air and reduce the bloated feeling in your belly (abdomen).  No driving for 24 hours (because of the medicine (anesthesia) used during the test).   Do not sign any important legal documents or operate any machinery for 24 hours (because of the anesthesia used during the test).  NUTRITION Drink plenty of fluids.  You may resume your normal diet as instructed by your doctor.  Begin with a light meal and progress to your normal diet. Heavy or fried foods are harder to digest and may make you feel sick to your stomach (nauseated).  Avoid alcoholic beverages for 24 hours or as instructed.  MEDICATIONS You may resume your normal medications unless your doctor tells you otherwise.  WHAT YOU CAN EXPECT TODAY Some feelings of bloating in the abdomen.  Passage of more gas than usual.  Spotting of blood in your stool or on the toilet paper.  IF YOU HAD POLYPS REMOVED DURING THE COLONOSCOPY: No aspirin products for 7 days or as instructed.  No alcohol for 7 days or as instructed.  Eat a soft diet for the next 24 hours.  FINDING OUT THE RESULTS OF YOUR TEST Not all test results are available during your visit. If your test results are not back during the visit, make an appointment with your caregiver to find out the results. Do not assume everything is normal if you have not heard from your  caregiver or the medical facility. It is important for you to follow up on all of your test results.  SEEK IMMEDIATE MEDICAL ATTENTION IF: You have more than a spotting of blood in your stool.  Your belly is swollen (abdominal distention).  You are nauseated or vomiting.  You have a temperature over 101.  You have abdominal pain or discomfort that is severe or gets worse throughout the day.   Your colonoscopy revealed 1 polyp(s) which I removed successfully. I recommend repeating colonoscopy in 7 years for surveillance purposes.   You also have diverticulosis and hemorrhoids. I would recommend increasing fiber in your diet or adding OTC Benefiber/Metamucil. Be sure to drink at least 4 to 6 glasses of water daily. Follow-up with GI in 3 months   I hope you have a great rest of your week!  Hennie Duos. Marletta Lor, D.O. Gastroenterology and Hepatology [facility]

## 2024-02-22 ENCOUNTER — Encounter (HOSPITAL_COMMUNITY): Payer: Self-pay | Admitting: Internal Medicine

## 2024-03-14 ENCOUNTER — Other Ambulatory Visit (HOSPITAL_COMMUNITY): Payer: Self-pay | Admitting: Family Medicine

## 2024-03-14 DIAGNOSIS — Z1231 Encounter for screening mammogram for malignant neoplasm of breast: Secondary | ICD-10-CM

## 2024-04-06 ENCOUNTER — Ambulatory Visit (HOSPITAL_COMMUNITY)
Admission: RE | Admit: 2024-04-06 | Discharge: 2024-04-06 | Disposition: A | Discharge status: Home / Self Care | Payer: 59 | Source: Ambulatory Visit | Attending: Vascular Surgery | Admitting: Vascular Surgery

## 2024-04-06 ENCOUNTER — Ambulatory Visit (INDEPENDENT_AMBULATORY_CARE_PROVIDER_SITE_OTHER): Payer: 59

## 2024-04-06 VITALS — BP 125/74 | HR 57 | Temp 98.1°F | Ht <= 58 in | Wt 162.7 lb

## 2024-04-06 DIAGNOSIS — I6523 Occlusion and stenosis of bilateral carotid arteries: Secondary | ICD-10-CM

## 2024-04-06 NOTE — Progress Notes (Signed)
 Office Note   History of Present Illness   Diamond Adams is a 63 y.o. (1960-12-18) female who presents for surveillance of carotid artery stenosis.  She has a history of right TCAR on 05/28/2022 by Dr. Rosalva Comber for symptomatic high-grade stenosis.  Postoperatively she has had no recurrent strokelike symptoms, however she has had dizziness and generalized fatigue.  She has also had a significant increase in her anxiety and panic attacks.  She returns today for follow-up.  Overall she is doing little bit better.  She says that she still has panic attacks.  She denies any strokelike symptoms such as slurred speech, facial droop, amaurosis fugax, or sudden weakness/numbness.  She takes a daily aspirin , statin, and Plavix .  Current Outpatient Medications  Medication Sig Dispense Refill   albuterol  (VENTOLIN  HFA) 108 (90 Base) MCG/ACT inhaler Inhale 1-2 puffs into the lungs every 4 (four) hours as needed for wheezing or shortness of breath. 8 g 3   ALPRAZolam  (XANAX ) 0.25 MG tablet Take 1 tablet (0.25 mg total) by mouth at bedtime as needed for anxiety. 15 tablet 0   amLODipine  (NORVASC ) 5 MG tablet Take 5 mg by mouth daily.     aspirin  EC 325 MG tablet Take 325 mg by mouth daily.     clopidogrel  (PLAVIX ) 75 MG tablet Take 75 mg by mouth daily.     doxycycline  (VIBRAMYCIN ) 100 MG capsule Take 1 capsule (100 mg total) by mouth 2 (two) times daily. 20 capsule 0   FLUoxetine  (PROZAC ) 10 MG capsule Take 10 mg by mouth daily.     ipratropium-albuterol  (DUONEB) 0.5-2.5 (3) MG/3ML SOLN Take 3 mLs by nebulization every 4 (four) hours as needed (wheezing, coughing, shortness of breath). 360 mL 1   lisinopril  (ZESTRIL ) 40 MG tablet Take 40 mg by mouth daily.     pantoprazole  (PROTONIX ) 40 MG tablet Take 1 tablet (40 mg total) by mouth daily. 30 tablet 0   rosuvastatin  (CRESTOR ) 40 MG tablet Take 1 tablet (40 mg total) by mouth daily. 30 tablet 5   TRELEGY ELLIPTA 100-62.5-25 MCG/ACT AEPB Inhale 1 puff into  the lungs daily.     valACYclovir (VALTREX) 500 MG tablet Take 500 mg by mouth daily.     No current facility-administered medications for this visit.    REVIEW OF SYSTEMS (negative unless checked):   Cardiac:  []  Chest pain or chest pressure? []  Shortness of breath upon activity? []  Shortness of breath when lying flat? []  Irregular heart rhythm?  Vascular:  []  Pain in calf, thigh, or hip brought on by walking? []  Pain in feet at night that wakes you up from your sleep? []  Blood clot in your veins? []  Leg swelling?  Pulmonary:  []  Oxygen at home? []  Productive cough? []  Wheezing?  Neurologic:  []  Sudden weakness in arms or legs? []  Sudden numbness in arms or legs? []  Sudden onset of difficult speaking or slurred speech? []  Temporary loss of vision in one eye? []  Problems with dizziness?  Gastrointestinal:  []  Blood in stool? []  Vomited blood?  Genitourinary:  []  Burning when urinating? []  Blood in urine?  Psychiatric:  []  Major depression  Hematologic:  []  Bleeding problems? []  Problems with blood clotting?  Dermatologic:  []  Rashes or ulcers?  Constitutional:  []  Fever or chills?  Ear/Nose/Throat:  []  Change in hearing? []  Nose bleeds? []  Sore throat?  Musculoskeletal:  []  Back pain? []  Joint pain? []  Muscle pain?   Physical Examination    Vitals:  04/06/24 0913 04/06/24 0915  BP: 129/78 125/74  Pulse: (!) 57   Temp: 98.1 F (36.7 C)   TempSrc: Temporal   SpO2: 98%   Weight: 162 lb 11.2 oz (73.8 kg)   Height: 4\' 10"  (1.473 m)    Body mass index is 34 kg/m.  General:  WDWN in NAD; vital signs documented above Gait: Not observed HENT: WNL, normocephalic Pulmonary: normal non-labored breathing , without rales, rhonchi,  wheezing Cardiac: regular, without carotid bruit Abdomen: soft, NT, no masses Skin: without rashes Vascular Exam/Pulses: palpable radial pulses bilaterally Extremities: without ischemic changes, without gangrene ,  without cellulitis; without open wounds;  Musculoskeletal: no muscle wasting or atrophy  Neurologic: A&O X 3;  No focal weakness or paresthesias are detected Psychiatric:  The pt has Normal affect.  Non-Invasive Vascular Imaging   Bilateral Carotid Duplex (04/06/2024):  R ICA stenosis:  patent right carotid stent without stenosis R VA:  patent and antegrade L ICA stenosis:  1-39% L VA:  patent and antegrade   Medical Decision Making   Diamond Adams is a 63 y.o. female who presents for surveillance of carotid artery stenosis  Based on the patient's vascular studies, her left ICA stenosis is stable and borderline 40 to 59%.  Her right carotid artery stent is patent without stenosis She denies any strokelike symptoms such as slurred speech, facial droop, sudden visual changes, or sudden weakness/numbness.  She has no carotid bruits on exam. She has palpable and equal radial pulses bilaterally She is fine to stop her Plavix  for a short period of time to get dental work done. This can be stopped for 7 days prior to dental work and then restarted afterwards She can follow-up with our office in 1 year with repeat carotid duplex   Deneise Finlay PA-C [facility] Office: [phone]  Clinic MD: Rosalva Comber

## 2024-04-14 DIAGNOSIS — R7989 Other specified abnormal findings of blood chemistry: Secondary | ICD-10-CM | POA: Diagnosis not present

## 2024-04-14 DIAGNOSIS — E785 Hyperlipidemia, unspecified: Secondary | ICD-10-CM | POA: Diagnosis not present

## 2024-04-14 DIAGNOSIS — I10 Essential (primary) hypertension: Secondary | ICD-10-CM | POA: Diagnosis not present

## 2024-04-20 ENCOUNTER — Encounter: Payer: Self-pay | Admitting: Gastroenterology

## 2024-04-21 DIAGNOSIS — K5904 Chronic idiopathic constipation: Secondary | ICD-10-CM | POA: Diagnosis not present

## 2024-04-21 DIAGNOSIS — G4700 Insomnia, unspecified: Secondary | ICD-10-CM | POA: Diagnosis not present

## 2024-04-21 DIAGNOSIS — K219 Gastro-esophageal reflux disease without esophagitis: Secondary | ICD-10-CM | POA: Diagnosis not present

## 2024-04-21 DIAGNOSIS — J302 Other seasonal allergic rhinitis: Secondary | ICD-10-CM | POA: Diagnosis not present

## 2024-04-21 DIAGNOSIS — I10 Essential (primary) hypertension: Secondary | ICD-10-CM | POA: Diagnosis not present

## 2024-04-21 DIAGNOSIS — R0600 Dyspnea, unspecified: Secondary | ICD-10-CM | POA: Diagnosis not present

## 2024-04-21 DIAGNOSIS — Z8673 Personal history of transient ischemic attack (TIA), and cerebral infarction without residual deficits: Secondary | ICD-10-CM | POA: Diagnosis not present

## 2024-04-21 DIAGNOSIS — I739 Peripheral vascular disease, unspecified: Secondary | ICD-10-CM | POA: Diagnosis not present

## 2024-04-21 DIAGNOSIS — J449 Chronic obstructive pulmonary disease, unspecified: Secondary | ICD-10-CM | POA: Diagnosis not present

## 2024-04-21 DIAGNOSIS — E785 Hyperlipidemia, unspecified: Secondary | ICD-10-CM | POA: Diagnosis not present

## 2024-05-01 ENCOUNTER — Inpatient Hospital Stay (HOSPITAL_COMMUNITY): Admission: RE | Admit: 2024-05-01 | Source: Ambulatory Visit

## 2024-05-04 ENCOUNTER — Ambulatory Visit: Admitting: Internal Medicine

## 2024-05-18 ENCOUNTER — Ambulatory Visit: Attending: Internal Medicine | Admitting: Internal Medicine

## 2024-05-18 ENCOUNTER — Encounter: Payer: Self-pay | Admitting: Internal Medicine

## 2024-05-18 VITALS — BP 118/72 | HR 62 | Ht <= 58 in | Wt 164.2 lb

## 2024-05-18 DIAGNOSIS — I10 Essential (primary) hypertension: Secondary | ICD-10-CM | POA: Diagnosis not present

## 2024-05-18 DIAGNOSIS — Z8673 Personal history of transient ischemic attack (TIA), and cerebral infarction without residual deficits: Secondary | ICD-10-CM | POA: Diagnosis not present

## 2024-05-18 DIAGNOSIS — I63511 Cerebral infarction due to unspecified occlusion or stenosis of right middle cerebral artery: Secondary | ICD-10-CM | POA: Diagnosis not present

## 2024-05-18 DIAGNOSIS — R002 Palpitations: Secondary | ICD-10-CM

## 2024-05-18 DIAGNOSIS — R0789 Other chest pain: Secondary | ICD-10-CM | POA: Diagnosis not present

## 2024-05-18 DIAGNOSIS — I6521 Occlusion and stenosis of right carotid artery: Secondary | ICD-10-CM

## 2024-05-18 NOTE — Progress Notes (Signed)
 Cardiology Office Note  Date: 05/18/2024   ID: Diamond Adams, DOB 07/23/1961, MRN [id number]  PCP:  Hyacinth Honey, NP  Cardiologist:  None Electrophysiologist:  None   Reason for Office Visit: Follow-up of palpitations and chest pressure   History of Present Illness: Diamond Adams is a 63 y.o. female known to have HTN, prediabetes, HLD, acute R MCA stroke in 04/2022, symptomatic R carotid artery stenosis s/p R TCAR in 05/2022 with residual L carotid artery stenosis 40 to 59%, COPD is here for follow-up visit.  Event monitor and NM stress test in 2024 were unremarkable.  No interval chest pressure or palpitations.  Doing great.  No DOE, syncope, dizziness or leg swelling.   Past Medical History:  Diagnosis Date   Anxiety disorder    Atherosclerosis    COPD (chronic obstructive pulmonary disease) (HCC)    Depression    Fibromyalgia    GERD (gastroesophageal reflux disease)    Herpes    Hypercholesterolemia    Hypertension    Neuropathy    Optic atrophy    Optic atrophy    PONV (postoperative nausea and vomiting)    Pre-diabetes    Prediabetes    Senile purpura (HCC)    Stroke [facility])     Past Surgical History:  Procedure Laterality Date   ABDOMINAL HYSTERECTOMY  1994   AORTIC ARCH ANGIOGRAPHY N/A 05/20/2022   Procedure: AORTIC ARCH ANGIOGRAPHY;  Surgeon: Lanis Fonda BRAVO, MD;  Location: [facility] INVASIVE CV LAB;  Service: Cardiovascular;  Laterality: N/A;   CAROTID ANGIOGRAPHY N/A 05/20/2022   Procedure: CAROTID ANGIOGRAPHY;  Surgeon: Lanis Fonda BRAVO, MD;  Location: [facility] INVASIVE CV LAB;  Service: Cardiovascular;  Laterality: N/A;   CARPAL TUNNEL RELEASE  2013,2011   right and left   COLONOSCOPY N/A 02/21/2024   Procedure: COLONOSCOPY;  Surgeon: Cindie Carlin POUR, DO;  Location: [facility];  Service: Endoscopy;  Laterality: N/A;  130pm, asa 3, does not need to stop plavix    EXCISION NASAL MASS Left 09/25/2013   Procedure: EXCISION NASAL CANCER WITH SPLIT THICKNESS SKIN  GRAFT FROM RIGHT THIGH;  Surgeon: Ana LELON Moccasin, MD;  Location: [facility];  Service: ENT;  Laterality: Left;   POLYPECTOMY  02/21/2024   Procedure: POLYPECTOMY;  Surgeon: Cindie Carlin POUR, DO;  Location: [facility];  Service: Endoscopy;;   RIGHT OOPHORECTOMY     THUMB ARTHROSCOPY     right   TONSILLECTOMY     TRANSCAROTID ARTERY REVASCULARIZATION  Right 05/28/2022   Procedure: Transcarotid Artery Revascularization;  Surgeon: Lanis Fonda BRAVO, MD;  Location: [facility] OR;  Service: Vascular;  Laterality: Right;   TUBAL LIGATION      Current Outpatient Medications  Medication Sig Dispense Refill   albuterol  (VENTOLIN  HFA) 108 (90 Base) MCG/ACT inhaler Inhale 1-2 puffs into the lungs every 4 (four) hours as needed for wheezing or shortness of breath. 8 g 3   ALPRAZolam  (XANAX ) 0.25 MG tablet Take 1 tablet (0.25 mg total) by mouth at bedtime as needed for anxiety. 15 tablet 0   amLODipine  (NORVASC ) 5 MG tablet Take 5 mg by mouth daily.     aspirin  EC 325 MG tablet Take 325 mg by mouth daily.     clopidogrel  (PLAVIX ) 75 MG tablet Take 75 mg by mouth daily.     FLUoxetine  (PROZAC ) 10 MG capsule Take 10 mg by mouth daily.     ipratropium-albuterol  (DUONEB) 0.5-2.5 (3) MG/3ML SOLN Take 3 mLs by nebulization every  4 (four) hours as needed (wheezing, coughing, shortness of breath). 360 mL 1   lisinopril  (ZESTRIL ) 40 MG tablet Take 40 mg by mouth daily.     pantoprazole  (PROTONIX ) 40 MG tablet Take 1 tablet (40 mg total) by mouth daily. 30 tablet 0   rosuvastatin  (CRESTOR ) 40 MG tablet Take 1 tablet (40 mg total) by mouth daily. 30 tablet 5   TRELEGY ELLIPTA 100-62.5-25 MCG/ACT AEPB Inhale 1 puff into the lungs daily.     valACYclovir (VALTREX) 500 MG tablet Take 500 mg by mouth daily.     No current facility-administered medications for this visit.   Allergies:  Trazodone and nefazodone   Social History: The patient  reports that she quit smoking about 2 years ago. Her smoking use  included cigarettes. She has never been exposed to tobacco smoke. She has never used smokeless tobacco. She reports that she does not drink alcohol and does not use drugs.   Family History: The patient's family history includes Cancer in her sister; Emphysema in her mother; Heart disease in her father; High blood pressure in her father; Obesity in her mother.   ROS:  Please see the history of present illness. Otherwise, complete review of systems is positive for none.  All other systems are reviewed and negative.   Physical Exam: VS:  BP 118/72   Pulse 62   Ht 4' 10 (1.473 m)   Wt 164 lb 3.2 oz (74.5 kg)   SpO2 98%   BMI 34.32 kg/m , BMI Body mass index is 34.32 kg/m.  Wt Readings from Last 3 Encounters:  05/18/24 164 lb 3.2 oz (74.5 kg)  04/06/24 162 lb 11.2 oz (73.8 kg)  02/21/24 149 lb 14.6 oz (68 kg)    General: Patient appears comfortable at rest. HEENT: Conjunctiva and lids normal, oropharynx clear with moist mucosa. Neck: Supple, no elevated JVP or carotid bruits, no thyromegaly. Lungs: Clear to auscultation, nonlabored breathing at rest. Cardiac: Regular rate and rhythm, no S3 or significant systolic murmur, no pericardial rub. Abdomen: Soft, nontender, no hepatomegaly, bowel sounds present, no guarding or rebound. Extremities: No pitting edema, distal pulses 2+. Skin: Warm and dry. Musculoskeletal: No kyphosis. Neuropsychiatric: Alert and oriented x3, affect grossly appropriate.  ECG:  An ECG dated 02/18/2023 was personally reviewed today and demonstrated:  Normal sinus rhythm  Recent Labwork: 12/23/2023: ALT 30; AST 16; B Natriuretic Peptide 37.0; Magnesium  2.0 12/27/2023: BUN 18; Creatinine, Ser 0.66; Hemoglobin 15.9; Platelets 342; Potassium 3.6; Sodium 141     Component Value Date/Time   CHOL 118 05/29/2022 0059   TRIG 113 05/29/2022 0059   HDL 40 (L) 05/29/2022 0059   CHOLHDL 3.0 05/29/2022 0059   VLDL 23 05/29/2022 0059   LDLCALC 55 05/29/2022 0059    Other  Studies Reviewed Today:   Assessment and Plan:   Palpitations: Resolved.  Unremarkable event monitor in 2024.  Chest pressure: Resolved.  Normal stress test in 2024.  Symptomatic R carotid artery stenosis s/p R TCAR in 05/2022 with residual L carotid artery stenosis less than 39% and acute CVA in 04/2022: Follows with vascular surgery.  Currently on aspirin  325 mg once daily and Plavix  75 mg once daily.  DAPT recommendations per vascular surgery.  Continue rosuvastatin  40 mg nightly. Repeat carotid artery ultrasound in May 2025 showed no evidence of carotid artery stenosis on the right side and less than 39% stenosis in the left carotid.  HTN, controlled: Continue amlodipine  5 mg once daily and lisinopril  40 mg  once daily.  HLD, unknown values: Continue rosuvastatin  40 mg at bedtime.  Goal is less than 70.  HLD management per PCP.    Medication Adjustments/Labs and Tests Ordered: Current medicines are reviewed at length with the patient today.  Concerns regarding medicines are outlined above.   Tests Ordered: No orders of the defined types were placed in this encounter.    Medication Changes: No orders of the defined types were placed in this encounter.   Disposition:  Follow up as needed  Signed Apolonia Ellwood Arleta Maywood, MD, 05/18/2024 9:58 AM    [facility] [address] [place], [place], [place] [address]

## 2024-05-18 NOTE — Patient Instructions (Signed)

## 2024-06-27 ENCOUNTER — Ambulatory Visit: Admitting: Internal Medicine

## 2024-07-06 DIAGNOSIS — R5383 Other fatigue: Secondary | ICD-10-CM | POA: Diagnosis not present

## 2024-07-06 DIAGNOSIS — Z79899 Other long term (current) drug therapy: Secondary | ICD-10-CM | POA: Diagnosis not present

## 2024-07-06 DIAGNOSIS — R002 Palpitations: Secondary | ICD-10-CM | POA: Diagnosis not present

## 2024-07-10 ENCOUNTER — Telehealth: Payer: Self-pay

## 2024-07-10 NOTE — Telephone Encounter (Signed)
 Received new referral from Carliss Batty DNP for fatigue. Placed in sleep mailbox

## 2024-08-29 DIAGNOSIS — Z792 Long term (current) use of antibiotics: Secondary | ICD-10-CM | POA: Diagnosis not present

## 2024-08-29 DIAGNOSIS — J069 Acute upper respiratory infection, unspecified: Secondary | ICD-10-CM | POA: Diagnosis not present

## 2024-08-29 DIAGNOSIS — Z87891 Personal history of nicotine dependence: Secondary | ICD-10-CM | POA: Diagnosis not present

## 2024-09-08 DIAGNOSIS — Z23 Encounter for immunization: Secondary | ICD-10-CM | POA: Diagnosis not present

## 2024-10-03 ENCOUNTER — Other Ambulatory Visit (HOSPITAL_COMMUNITY): Payer: Self-pay

## 2024-11-08 ENCOUNTER — Ambulatory Visit (HOSPITAL_COMMUNITY)
Admission: RE | Admit: 2024-11-08 | Discharge: 2024-11-08 | Disposition: A | Discharge status: Home / Self Care | Source: Ambulatory Visit | Attending: Family Medicine | Admitting: Family Medicine

## 2024-11-08 ENCOUNTER — Other Ambulatory Visit (HOSPITAL_COMMUNITY): Payer: Self-pay | Admitting: Family Medicine

## 2024-11-08 DIAGNOSIS — M25472 Effusion, left ankle: Secondary | ICD-10-CM

## 2024-11-09 DIAGNOSIS — M25572 Pain in left ankle and joints of left foot: Secondary | ICD-10-CM

## 2024-11-15 ENCOUNTER — Ambulatory Visit (HOSPITAL_COMMUNITY)
Admission: RE | Admit: 2024-11-15 | Discharge: 2024-11-15 | Disposition: A | Discharge status: Home / Self Care | Source: Ambulatory Visit | Attending: Family Medicine | Admitting: Family Medicine

## 2024-11-15 DIAGNOSIS — M25572 Pain in left ankle and joints of left foot: Secondary | ICD-10-CM | POA: Diagnosis present
